# Patient Record
Sex: Female | Born: 1970 | Race: Black or African American | Hispanic: No | Smoking: Current every day smoker
Health system: Southern US, Community
[De-identification: ages and names within clinical notes are randomized; demographics above are authoritative.]

## PROBLEM LIST (undated history)

## (undated) DIAGNOSIS — F32A Depression, unspecified: Secondary | ICD-10-CM

## (undated) DIAGNOSIS — F419 Anxiety disorder, unspecified: Secondary | ICD-10-CM

## (undated) DIAGNOSIS — C55 Malignant neoplasm of uterus, part unspecified: Secondary | ICD-10-CM

## (undated) DIAGNOSIS — M797 Fibromyalgia: Secondary | ICD-10-CM

## (undated) DIAGNOSIS — M545 Low back pain, unspecified: Secondary | ICD-10-CM

## (undated) DIAGNOSIS — G4733 Obstructive sleep apnea (adult) (pediatric): Secondary | ICD-10-CM

## (undated) DIAGNOSIS — G43909 Migraine, unspecified, not intractable, without status migrainosus: Secondary | ICD-10-CM

## (undated) DIAGNOSIS — IMO0001 Reserved for inherently not codable concepts without codable children: Secondary | ICD-10-CM

## (undated) DIAGNOSIS — J449 Chronic obstructive pulmonary disease, unspecified: Secondary | ICD-10-CM

## (undated) DIAGNOSIS — D509 Iron deficiency anemia, unspecified: Secondary | ICD-10-CM

## (undated) DIAGNOSIS — K219 Gastro-esophageal reflux disease without esophagitis: Secondary | ICD-10-CM

## (undated) DIAGNOSIS — J45909 Unspecified asthma, uncomplicated: Secondary | ICD-10-CM

## (undated) DIAGNOSIS — I10 Essential (primary) hypertension: Secondary | ICD-10-CM

## (undated) DIAGNOSIS — G8929 Other chronic pain: Secondary | ICD-10-CM

## (undated) DIAGNOSIS — F329 Major depressive disorder, single episode, unspecified: Secondary | ICD-10-CM

## (undated) DIAGNOSIS — Z9289 Personal history of other medical treatment: Secondary | ICD-10-CM

---

## 2010-11-14 HISTORY — PX: TOTAL SHOULDER REPLACEMENT: SUR1217

## 2012-05-26 DIAGNOSIS — R110 Nausea: Secondary | ICD-10-CM | POA: Insufficient documentation

## 2012-05-26 DIAGNOSIS — N898 Other specified noninflammatory disorders of vagina: Secondary | ICD-10-CM | POA: Insufficient documentation

## 2012-05-26 DIAGNOSIS — R1031 Right lower quadrant pain: Secondary | ICD-10-CM | POA: Insufficient documentation

## 2012-05-26 DIAGNOSIS — R6883 Chills (without fever): Secondary | ICD-10-CM | POA: Insufficient documentation

## 2012-05-27 ENCOUNTER — Emergency Department (HOSPITAL_COMMUNITY)
Admission: EM | Admit: 2012-05-27 | Discharge: 2012-05-27 | Disposition: A | Discharge status: Home / Self Care | Payer: PRIVATE HEALTH INSURANCE | Attending: Emergency Medicine | Admitting: Emergency Medicine

## 2012-05-27 ENCOUNTER — Encounter (HOSPITAL_COMMUNITY): Payer: Self-pay | Admitting: *Deleted

## 2012-05-27 DIAGNOSIS — N939 Abnormal uterine and vaginal bleeding, unspecified: Secondary | ICD-10-CM

## 2012-05-27 DIAGNOSIS — R109 Unspecified abdominal pain: Secondary | ICD-10-CM

## 2012-05-27 LAB — CBC
MCH: 27.7 pg (ref 26.0–34.0)
MCHC: 34.2 g/dL (ref 30.0–36.0)
MCV: 81.1 fL (ref 78.0–100.0)
Platelets: 441 10*3/uL — ABNORMAL HIGH (ref 150–400)
RBC: 4.29 MIL/uL (ref 3.87–5.11)

## 2012-05-27 LAB — URINALYSIS, ROUTINE W REFLEX MICROSCOPIC
Bilirubin Urine: NEGATIVE
Ketones, ur: NEGATIVE mg/dL
Nitrite: NEGATIVE
Specific Gravity, Urine: 1.021 (ref 1.005–1.030)
Urobilinogen, UA: 0.2 mg/dL (ref 0.0–1.0)

## 2012-05-27 LAB — COMPREHENSIVE METABOLIC PANEL
AST: 20 U/L (ref 0–37)
CO2: 21 mEq/L (ref 19–32)
Calcium: 9.2 mg/dL (ref 8.4–10.5)
Creatinine, Ser: 0.58 mg/dL (ref 0.50–1.10)
GFR calc Af Amer: >90 mL/min (ref >90)
GFR calc non Af Amer: >90 mL/min (ref >90)
Glucose, Bld: 110 mg/dL — ABNORMAL HIGH (ref 70–99)
Total Protein: 7.5 g/dL (ref 6.0–8.3)

## 2012-05-27 LAB — URINE MICROSCOPIC-ADD ON

## 2012-05-27 MED ORDER — OXYCODONE-ACETAMINOPHEN 5-325 MG PO TABS
2.0000 | ORAL_TABLET | Freq: Once | ORAL | Status: AC
  Administered 2012-05-27: 2 via ORAL
  Filled 2012-05-27: qty 2

## 2012-05-27 MED ORDER — ONDANSETRON 8 MG PO TBDP: 8.0000 mg | ORAL_TABLET | Freq: Three times a day (TID) | ORAL | Status: AC | PRN

## 2012-05-27 MED ORDER — OXYCODONE-ACETAMINOPHEN 5-325 MG PO TABS: 1.0000 | ORAL_TABLET | ORAL | Status: DC | PRN

## 2012-05-27 NOTE — ED Notes (Signed)
Pt states that she was told in 12/12 that she had endometrial cancer cells in her pap smear. Then moved from [facility] to [facility] and has not seen a MD since. Pt states that she has excruciating cramps with each menstral period. Pt states that she took acetaminophen x 2.

## 2012-05-27 NOTE — ED Provider Notes (Signed)
History     CSN: [id number]  Arrival date & time 05/26/12  2354   First MD Initiated Contact with Patient 05/27/12 [phone]      Chief Complaint  Patient presents with  . Abdominal Pain  . Nausea     Patient is a 41 y.o. female presenting with abdominal pain. The history is provided by the patient.  Abdominal Pain The primary symptoms of the illness include abdominal pain and nausea. The primary symptoms of the illness do not include vomiting or diarrhea. Episode onset: earlier tonight. The onset of the illness was sudden. The problem has been gradually worsening.  Additional symptoms associated with the illness include chills. Symptoms associated with the illness do not include back pain.  Pt presents for sudden onset of RLQ pain with vaginal bleeding She reports that she has had this before, and will frequently accompany her menstrual cycle. She reports she was sitting in her car when this happened.  She was not exerting herself She denies dysuria.   She was otherwise well previously in the day PMH - fibromyalgia  Past Surgical History  Procedure Date  . Total shoulder replacement     No family history on file.  History  Substance Use Topics  . Smoking status: Not on file  . Smokeless tobacco: Not on file  . Alcohol Use: No    OB History    Grav Para Term Preterm Abortions TAB SAB Ect Mult Living                  Review of Systems  Constitutional: Positive for chills.  Gastrointestinal: Positive for nausea and abdominal pain. Negative for vomiting and diarrhea.  Musculoskeletal: Negative for back pain.  All other systems reviewed and are negative.    Allergies  Tramadol  Home Medications   Current Outpatient Rx  Name Route Sig Dispense Refill  . ACETAMINOPHEN 500 MG PO TABS Oral Take 1,000 mg by mouth every 6 (six) hours as needed. For pain      BP 133/86  Pulse 73  Temp 98.7 F (37.1 C) (Oral)  Resp 20  Wt 167 lb 15.9 oz (76.2 kg)  SpO2 100%  LMP  05/27/2012  Physical Exam CONSTITUTIONAL: Well developed/well nourished HEAD AND FACE: Normocephalic/atraumatic EYES: EOMI/PERRL ENMT: Mucous membranes moist NECK: supple no meningeal signs SPINE:entire spine nontender CV: S1/S2 noted, no murmurs/rubs/gallops noted LUNGS: Lungs are clear to auscultation bilaterally, no apparent distress ABDOMEN: soft, nontender, no rebound or guarding GU:no cva tenderness, vaginal bleeding noted but os is closed.  No adnexal tenderness is noted, no adnexal mass is noted.  No cmt.  No uterine enlargement is noted.  Chaperone present NEURO: Pt is awake/alert, moves all extremitiesx4 EXTREMITIES: pulses normal, full ROM SKIN: warm, color normal PSYCH: no abnormalities of mood noted  ED Course  Procedures   Labs Reviewed  URINALYSIS, ROUTINE W REFLEX MICROSCOPIC - Abnormal; Notable for the following:    Hgb urine dipstick MODERATE (*)     Leukocytes, UA TRACE (*)     All other components within normal limits  CBC - Abnormal; Notable for the following:    Hemoglobin 11.9 (*)     HCT 34.8 (*)     Platelets 441 (*)     All other components within normal limits  COMPREHENSIVE METABOLIC PANEL - Abnormal; Notable for the following:    Glucose, Bld 110 (*)     Total Bilirubin 0.1 (*)     All other components within normal  limits  URINE MICROSCOPIC-ADD ON - Abnormal; Notable for the following:    Squamous Epithelial / LPF FEW (*)     All other components within normal limits  POCT PREGNANCY, URINE   Pt reports that while she was in [place] was told she may have endometrial CA.  She has not followed up for this as of yet in [place].  She is well appearing, her abdomen and gyn exam were unremarkable except for vag bleeding.  She reports similar type pain with menstrual cycle in past.  I doubt acute abdominal/gyn process and I doubt TOA/torsion at this time.  Discussed strict return precautions.     MDM  Nursing notes including past medical history  and social history reviewed and considered in documentation All labs/vitals reviewed and considered         Joya Gaskins, MD 05/27/12 [phone]

## 2012-05-27 NOTE — Discharge Instructions (Signed)
Dysfunctional Uterine Bleeding Normally, menstrual periods begin between ages 11 to 17 in young women. A normal menstrual cycle/period may begin every 23 days up to 35 days and lasts from 1 to 7 days. Around 12 to 14 days before your menstrual period starts, ovulation (ovary produces an egg) occurs. When counting the time between menstrual periods, count from the first day of bleeding of the previous period to the first day of bleeding of the next period. Dysfunctional (abnormal) uterine bleeding is bleeding that is different from a normal menstrual period. Your periods may come earlier or later than usual. They may be lighter, have blood clots or be heavier. You may have bleeding between periods, or you may skip one period or more. You may have bleeding after sexual intercourse, bleeding after menopause, or no menstrual period. CAUSES   Pregnancy (normal, miscarriage, tubal).   IUDs (intrauterine device, birth control).   Birth control pills.   Hormone treatment.   Menopause.   Infection of the cervix.   Blood clotting problems.   Infection of the inside lining of the uterus.   Endometriosis, inside lining of the uterus growing in the pelvis and other female organs.   Adhesions (scar tissue) inside the uterus.   Obesity or severe weight loss.   Uterine polyps inside the uterus.   Cancer of the vagina, cervix, or uterus.   Ovarian cysts or polycystic ovary syndrome.   Medical problems (diabetes, thyroid disease).   Uterine fibroids (noncancerous tumor).   Problems with your female hormones.   Endometrial hyperplasia, very thick lining and enlarged cells inside of the uterus.   Medicines that interfere with ovulation.   Radiation to the pelvis or abdomen.   Chemotherapy.  DIAGNOSIS   Your doctor will discuss the history of your menstrual periods, medicines you are taking, changes in your weight, stress in your life, and any medical problems you may have.   Your doctor  will do a physical and pelvic examination.   Your doctor may want to perform certain tests to make a diagnosis, such as:   Pap test.   Blood tests.   Cultures for infection.   CT scan.   Ultrasound.   Hysteroscopy.   Laparoscopy.   MRI.   Hysterosalpingography.   D and C.   Endometrial biopsy.  TREATMENT  Treatment will depend on the cause of the dysfunctional uterine bleeding (DUB). Treatment may include:  Observing your menstrual periods for a couple of months.   Prescribing medicines for medical problems, including:   Antibiotics.   Hormones.   Birth control pills.   Removing an IUD (intrauterine device, birth control).   Surgery:   D and C (scrape and remove tissue from inside the uterus).   Laparoscopy (examine inside the abdomen with a lighted tube).   Uterine ablation (destroy lining of the uterus with electrical current, laser, heat, or freezing).   Hysteroscopy (examine cervix and uterus with a lighted tube).   Hysterectomy (remove the uterus).  HOME CARE INSTRUCTIONS   If medicines were prescribed, take exactly as directed. Do not change or switch medicines without consulting your caregiver.   Long term heavy bleeding may result in iron deficiency. Your caregiver may have prescribed iron pills. They help replace the iron that your body lost from heavy bleeding. Take exactly as directed.   Do not take aspirin or medicines that contain aspirin one week before or during your menstrual period. Aspirin may make the bleeding worse.   If you need   to change your sanitary pad or tampon more than once every 2 hours, stay in bed with your feet elevated and a cold pack on your lower abdomen. Rest as much as possible, until the bleeding stops or slows down.   Eat well-balanced meals. Eat foods high in iron. Examples are:   Leafy green vegetables.   Whole-grain breads and cereals.   Eggs.   Meat.   Liver.   Do not try to lose weight until the  abnormal bleeding has stopped and your blood iron level is back to normal. Do not lift more than ten pounds or do strenuous activities when you are bleeding.   For a couple of months, make note on your calendar, marking the start and ending of your period, and the type of bleeding (light, medium, heavy, spotting, clots or missed periods). This is for your caregiver to better evaluate your problem.  SEEK MEDICAL CARE IF:   You develop nausea (feeling sick to your stomach) and vomiting, dizziness, or diarrhea while you are taking your medicine.   You are getting lightheaded or weak.   You have any problems that may be related to the medicine you are taking.   You develop pain with your DUB.   You want to remove your IUD.   You want to stop or change your birth control pills or hormones.   You have any type of abnormal bleeding mentioned above.   You are over 25 years old and have not had a menstrual period yet.   You are 41 years old and you are still having menstrual periods.   You have any of the symptoms mentioned above.   You develop a rash.  SEEK IMMEDIATE MEDICAL CARE IF:   An oral temperature above 102 F (38.9 C) develops.   You develop chills.   You are changing your sanitary pad or tampon more than once an hour.   You develop abdominal pain.   You pass out or faint.  Document Released: 10/28/2000 Document Revised: 10/20/2011 Document Reviewed: 09/29/2009 [facility] Patient Information 2012 [place], [place].  RESOURCE GUIDE  Chronic Pain Problems: Contact [facility]  [phone] Patients need to be referred by their primary care doctor.  Insufficient Money for Medicine: Contact United Way:  call "211" or [facility] [phone] [phone].  No Primary Care Doctor: - Call Health Connect  [phone] - can help you locate a primary care doctor that  accepts your insurance, provides certain services, etc. - Physician Referral [phone]  Agencies that provide inexpensive medical care: - [facility]  [phone] - [facility]  [phone] - [facility]  [phone] [facility]  [phone] - Planned Parenthood  [phone] [facility]  [phone]  Medicaid-accepting [facility] Providers: - [facility]- [address]  [phone], Mon-Fri 9am-7pm, Sat 9am-1pm - [facility]- [address] [place], [address] [place]  [phone] - [facility]- [address], [address] [place]  [phone] [facility]- [address]  [phone] - Renaye Rakers- [address] [place], [address], [phone]  Only accepts [place] patients after they have their name  applied to their card  Self Pay (no insurance) in [place]: - Sickle Cell Patients: Dr Willey Blade, [facility]  [address] [place], [phone] - [facility]- [address] [place]  [phone]       -  [facility]- [address]       -     [facility]- see information above (Speak to [employer] if you do not have insurance)       -  [facility]- [address] [place], [phone]       -  [facility]- [address],  [phone]       -  [facility]- [address] [phone]       -  Dr Julio Sicks-  [address], [address], [place], [phone]       -  [facility]- [address] [phone]       -  [facility]- [address] [phone], also [address] [phone]       -    [facility]- [address] [place], [phone], 1st & 3rd Saturday   every month, 10am-1pm  1) Find a Doctor and Pay Out of Pocket Although you won't have to find out who is covered by your insurance plan, it is a good idea to ask around and get recommendations. You will then need to call the office and  see if the doctor you have chosen will accept you as a new patient and what types of options they offer for patients who are self-pay. Some doctors offer discounts or will set up payment plans for their patients who do not have insurance, but you will need to ask so you aren't surprised when you get to your appointment.  2) Contact Your Local Health Department Not all health departments have doctors that can see patients for sick visits, but many do, so it is worth a call to see if yours does. If you don't know where your local health department is, you can check in your phone book. The CDC also has a tool to help you locate your state's health department, and many state websites also have listings of all of their local health departments.  3) Find a Walk-in Clinic If your illness is not likely to be very severe or complicated, you may want to try a walk in clinic. These are popping up all over the country in pharmacies, drugstores, and shopping centers. They're usually staffed by nurse practitioners or physician assistants that have been trained to treat common illnesses and complaints. They're usually fairly quick and inexpensive. However, if you have serious medical issues or chronic medical problems, these are probably not your best option  STD Testing - [facility] Department of [facility], STD Clinic, [address] [place], phone [phone] or [phone].  Monday - Friday, call for an appointment. [facility] Department of [employer], STD Clinic, [address], [place], phone [phone] or [phone].  Monday - Friday, call for an appointment.  Abuse/Neglect: [facility] Child Abuse Hotline 308-831-5396 [facility] Child Abuse Hotline (419)645-8652 (After Hours)  Emergency Shelter:  [facility] 302-243-0420  Maternity Homes: - [facility] 925-795-2721 - [facility] (684)411-3148  MRSA Hotline #:   (704) 018-6306  [facility] Resources  [facility] [place] [facility]  United Way [facility] [address]                 [address]  [address]  [place]                                               Cristobal Goldmann Phone:  [phone]                                  Phone:  [phone]                   Phone:  [phone]  [facility], [phone] - [phone]       -     [facility] in [place], [address],                                  [phone], [facility] Child Abuse Hotline 541-687-1848 or (458) 851-2633 (After Hours)   Behavioral Health Services  Substance Abuse Resources: - [facility] Drug [facility]  (317) 276-5663 - [facility] (270)301-9824 - [facility] [phone] Floydene Flock [phone] - Residential & Outpatient Substance Abuse Program  [phone]  Psychological Services: [facility]  [phone] [facility] Services  [phone] - [facility], [phone] [place]. [address] [place], ACCESS LINE: (920)136-3184 or [phone], [employer].za  Dental Assistance  If unable to pay or uninsured, contact:  [facility] or [facility]. to become qualified for the adult dental clinic.  Patients with Medicaid: [facility] [phone], [phone] [address], [phone]  If unable to pay, or uninsured, contact [facility] [phone]) or [facility] Department [phone] in [place], [phone] in [facility]) to become qualified for the adult dental clinic  Other Low-Cost Community Dental Services: - [facility]- [address] [place], [place], [place], [address], [phone], [address], 2nd and 4th  Thursday of the month at 6:30am.  10 clients each day by appointment, can sometimes see walk-in patients if someone does not show for an appointment. [facility]- [address] [place] [place], [place], [address], [phone] - [facility]- [address] [place], [place], [address], [phone] - [facility]- [phone] [facility] Department- [phone] [facility] Department- [phone]

## 2012-05-27 NOTE — ED Notes (Signed)
Sudden onset lower right quadrant pain

## 2012-05-28 ENCOUNTER — Emergency Department (HOSPITAL_COMMUNITY): Payer: PRIVATE HEALTH INSURANCE

## 2012-05-28 ENCOUNTER — Encounter (HOSPITAL_COMMUNITY): Payer: Self-pay | Admitting: Emergency Medicine

## 2012-05-28 ENCOUNTER — Emergency Department (HOSPITAL_COMMUNITY)
Admission: EM | Admit: 2012-05-28 | Discharge: 2012-05-29 | Disposition: A | Discharge status: Home / Self Care | Payer: PRIVATE HEALTH INSURANCE | Attending: Emergency Medicine | Admitting: Emergency Medicine

## 2012-05-28 DIAGNOSIS — R10813 Right lower quadrant abdominal tenderness: Secondary | ICD-10-CM | POA: Insufficient documentation

## 2012-05-28 DIAGNOSIS — R109 Unspecified abdominal pain: Secondary | ICD-10-CM | POA: Insufficient documentation

## 2012-05-28 DIAGNOSIS — R112 Nausea with vomiting, unspecified: Secondary | ICD-10-CM | POA: Insufficient documentation

## 2012-05-28 HISTORY — DX: Fibromyalgia: M79.7

## 2012-05-28 LAB — URINALYSIS, ROUTINE W REFLEX MICROSCOPIC
Protein, ur: 30 mg/dL — AB
Urobilinogen, UA: 0.2 mg/dL (ref 0.0–1.0)

## 2012-05-28 LAB — BASIC METABOLIC PANEL
Calcium: 9.7 mg/dL (ref 8.4–10.5)
GFR calc Af Amer: >90 mL/min (ref >90)
GFR calc non Af Amer: >90 mL/min (ref >90)
Sodium: 136 mEq/L (ref 135–145)

## 2012-05-28 LAB — CBC WITH DIFFERENTIAL/PLATELET
Basophils Absolute: 0 10*3/uL (ref 0.0–0.1)
Basophils Relative: 0 % (ref 0–1)
Eosinophils Absolute: 0.1 10*3/uL (ref 0.0–0.7)
Eosinophils Relative: 2 % (ref 0–5)
MCH: 27.7 pg (ref 26.0–34.0)
MCV: 82.3 fL (ref 78.0–100.0)
Platelets: 440 10*3/uL — ABNORMAL HIGH (ref 150–400)
RDW: 13.5 % (ref 11.5–15.5)

## 2012-05-28 LAB — URINE MICROSCOPIC-ADD ON

## 2012-05-28 MED ORDER — SODIUM CHLORIDE 0.9 % IV BOLUS (SEPSIS)
1000.0000 mL | Freq: Once | INTRAVENOUS | Status: AC
  Administered 2012-05-28: 1000 mL via INTRAVENOUS

## 2012-05-28 MED ORDER — SODIUM CHLORIDE 0.9 % IV SOLN
Freq: Once | INTRAVENOUS | Status: AC
  Administered 2012-05-28: 21:00:00 via INTRAVENOUS

## 2012-05-28 MED ORDER — IOHEXOL 300 MG/ML  SOLN
100.0000 mL | Freq: Once | INTRAMUSCULAR | Status: AC | PRN
  Administered 2012-05-28: 100 mL via INTRAVENOUS

## 2012-05-28 MED ORDER — MORPHINE SULFATE 4 MG/ML IJ SOLN
4.0000 mg | Freq: Once | INTRAMUSCULAR | Status: AC
  Administered 2012-05-28: 4 mg via INTRAVENOUS
  Filled 2012-05-28: qty 1

## 2012-05-28 MED ORDER — ONDANSETRON HCL 4 MG/2ML IJ SOLN
4.0000 mg | Freq: Once | INTRAMUSCULAR | Status: AC
  Administered 2012-05-28: 4 mg via INTRAVENOUS
  Filled 2012-05-28: qty 2

## 2012-05-28 MED ORDER — PROMETHAZINE HCL 25 MG PO TABS: 25.0000 mg | ORAL_TABLET | Freq: Four times a day (QID) | ORAL | Status: DC | PRN

## 2012-05-28 MED ORDER — PROMETHAZINE HCL 25 MG RE SUPP: 25.0000 mg | Freq: Four times a day (QID) | RECTAL | Status: DC | PRN

## 2012-05-28 NOTE — ED Provider Notes (Signed)
History     CSN: [id number]  Arrival date & time 05/28/12  1936   First MD Initiated Contact with Patient 05/28/12 2033      Chief Complaint  Patient presents with  . Emesis    (Consider location/radiation/quality/duration/timing/severity/associated sxs/prior treatment) HPI Comments: Pt with nausea and vomiting for past 3 days. Was seen in ED for this on Sat evening, had negative w/u, and was discharged home with percocet and zofran. Pt states she's had continued sx and has been unable to hold down her medications due to this. No diarrhea, fever, urinary sx. No sig change in sx since Sat.  Pt reports she began her menstrual period on Sat and has had severe cramping located to the suprapubic area and RLQ with this. This is fairly typical of her periods, but she hasn't had n/v in the past. Reports she was diagnosed in 12/12 with endometrial cancer - reports she was hospitalized and recalls having US of the pelvis, no biopsies taken. This was when she was living in [place]; she hasn't followed up on this since moving to [place].  Patient is a 41 y.o. female presenting with vomiting. The history is provided by the patient.  Emesis  This is a new problem. Episode onset: 3 days ago. The problem occurs 5 to 10 times per day. The problem has not changed since onset.The emesis has an appearance of stomach contents. There has been no fever. Associated symptoms include abdominal pain. Pertinent negatives include no chills and no cough.    Past Medical History  Diagnosis Date  . Endometrial cancer   . Fibromyalgia     Past Surgical History  Procedure Date  . Total shoulder replacement     Family History  Problem Relation Age of Onset  . Coronary artery disease Father   . Heart attack Father   . Hypertension Other   . Diabetes Other   . Cancer Other     History  Substance Use Topics  . Smoking status: Current Everyday Smoker -- 0.5 packs/day    Types: Cigarettes  . Smokeless tobacco:  Not on file  . Alcohol Use: No    OB History    Grav Para Term Preterm Abortions TAB SAB Ect Mult Living                  Review of Systems  Constitutional: Negative for chills.  Respiratory: Negative for cough.   Gastrointestinal: Positive for vomiting and abdominal pain.   10 pt ROS otherwise negative except as HPI  Allergies  Tramadol  Home Medications   Current Outpatient Rx  Name Route Sig Dispense Refill  . ACETAMINOPHEN 500 MG PO TABS Oral Take 1,000 mg by mouth every 6 (six) hours as needed. For pain    . ONDANSETRON 8 MG PO TBDP Oral Take 1 tablet (8 mg total) by mouth every 8 (eight) hours as needed for nausea. 20 tablet 0  . OXYCODONE-ACETAMINOPHEN 5-325 MG PO TABS Oral Take 1 tablet by mouth every 4 (four) hours as needed for pain. 15 tablet 0    BP 120/79  Pulse 81  Temp 98.4 F (36.9 C) (Oral)  Resp 18  SpO2 100%  LMP 05/26/2012  Physical Exam  Nursing note and vitals reviewed. Constitutional: She appears well-developed and well-nourished. No distress.  HENT:  Head: Normocephalic and atraumatic.  Mouth/Throat: Oropharynx is clear and moist. No oropharyngeal exudate.  Eyes:       Normal appearance  Neck: Normal range of motion.  Neck supple.  Cardiovascular: Normal rate, regular rhythm and normal heart sounds.   Pulmonary/Chest: Effort normal and breath sounds normal.  Abdominal: Soft. Bowel sounds are normal. There is tenderness in the right lower quadrant. There is no rebound, no guarding and no tenderness at McBurney's point.  Musculoskeletal: Normal range of motion.  Lymphadenopathy:    She has no cervical adenopathy.  Neurological: She is alert.  Skin: Skin is warm and dry. She is not diaphoretic.  Psychiatric: She has a normal mood and affect.    ED Course  Procedures (including critical care time)  Labs Reviewed  CBC WITH DIFFERENTIAL - Abnormal; Notable for the following:    Platelets 440 (*)     All other components within normal  limits  URINALYSIS, ROUTINE W REFLEX MICROSCOPIC - Abnormal; Notable for the following:    APPearance CLOUDY (*)     Hgb urine dipstick LARGE (*)     Ketones, ur TRACE (*)     Protein, ur 30 (*)     Leukocytes, UA SMALL (*)     All other components within normal limits  URINE MICROSCOPIC-ADD ON - Abnormal; Notable for the following:    Squamous Epithelial / LPF FEW (*)     Bacteria, UA MANY (*)     All other components within normal limits  BASIC METABOLIC PANEL   Ct Abdomen Pelvis W Contrast  05/28/2012  *RADIOLOGY REPORT*  Clinical Data: Right lower quadrant and suprapubic pain for 3 days. Nausea and vomiting.  CT ABDOMEN AND PELVIS WITH CONTRAST  Technique:  Multidetector CT imaging of the abdomen and pelvis was performed following the standard protocol during bolus administration of intravenous contrast.  Contrast: OMNIPAQUE IOHEXOL 300 MG/ML  SOLN  Comparison: None.  Findings: Visualized lung bases are clear.  The liver, spleen, gallbladder, pancreas, adrenal glands, abdominal aorta, and retroperitoneal lymph nodes are unremarkable.  15 mm cyst in the lower pole of the right kidney.  Additional 5 mm cyst in the lower pole of the right kidney.  No solid mass or hydronephrosis in either kidney.  The stomach and small bowel are not distended.  Stool filled colon without distension.  No free air or free fluid in the abdomen.  Pelvis:  The uterus and adnexal structures are not enlarged.  The bladder is decompressed without apparent wall thickening.  Small amount of free fluid in the pelvis which may be physiologic.  The appendix is normal.  No evidence of diverticulitis.  Mild degenerative changes in the lumbar spine.  IMPRESSION: No acute process demonstrated in the abdomen or pelvis.  Original Report Authenticated By: Marlon Pel, M.D.     1. Nausea and vomiting       MDM  Pt presents with n/v and abd pain since Sat. Previous note and labs reviewed, labs were reassuring  appearing at that visit. No sig new findings on labwork today, no evidence of dehydration on exam or via labs. Based on length of pt's sx, imaging was obtained which is negative for acute abd or gross evidence of oncologic process. Pt felt significantly better with fluids and pain meds and had no emesis while in dept. New rxes given for Phenergan. Instructed her on importance of f/u with GYN for further eval. Pt verbalized understanding, agreed to plan.        Grant Fontana, PA-C 05/29/12 1555

## 2012-05-28 NOTE — ED Notes (Signed)
Pt states she was seen here on Saturday and was given oxycodone APAP and zofran   Pt states she is unable to hold the pain medication down it has been making her nauseated and vomiting even with the zofran  Pt states she has also been having cramping with the medication  Pt states she came here for the cramping and states she has endometrial cancer

## 2012-05-30 NOTE — ED Provider Notes (Signed)
Medical screening examination/treatment/procedure(s) were performed by non-physician practitioner and as supervising physician I was immediately available for consultation/collaboration.   Loren Racer, MD 05/30/12 [phone]

## 2012-05-31 ENCOUNTER — Observation Stay (HOSPITAL_COMMUNITY): Payer: PRIVATE HEALTH INSURANCE

## 2012-05-31 ENCOUNTER — Inpatient Hospital Stay (HOSPITAL_COMMUNITY)
Admission: EM | Admit: 2012-05-31 | Discharge: 2012-06-03 | DRG: 603 | Disposition: A | Discharge status: Home / Self Care | Payer: PRIVATE HEALTH INSURANCE | Attending: Internal Medicine | Admitting: Internal Medicine

## 2012-05-31 ENCOUNTER — Encounter (HOSPITAL_COMMUNITY): Payer: Self-pay | Admitting: Emergency Medicine

## 2012-05-31 DIAGNOSIS — D649 Anemia, unspecified: Secondary | ICD-10-CM | POA: Diagnosis present

## 2012-05-31 DIAGNOSIS — D72829 Elevated white blood cell count, unspecified: Secondary | ICD-10-CM

## 2012-05-31 DIAGNOSIS — E876 Hypokalemia: Secondary | ICD-10-CM

## 2012-05-31 DIAGNOSIS — D509 Iron deficiency anemia, unspecified: Secondary | ICD-10-CM

## 2012-05-31 DIAGNOSIS — T380X5A Adverse effect of glucocorticoids and synthetic analogues, initial encounter: Secondary | ICD-10-CM | POA: Diagnosis not present

## 2012-05-31 DIAGNOSIS — R221 Localized swelling, mass and lump, neck: Secondary | ICD-10-CM | POA: Diagnosis present

## 2012-05-31 DIAGNOSIS — F17200 Nicotine dependence, unspecified, uncomplicated: Secondary | ICD-10-CM | POA: Diagnosis present

## 2012-05-31 DIAGNOSIS — L03221 Cellulitis of neck: Principal | ICD-10-CM

## 2012-05-31 DIAGNOSIS — R220 Localized swelling, mass and lump, head: Secondary | ICD-10-CM

## 2012-05-31 DIAGNOSIS — T7840XA Allergy, unspecified, initial encounter: Secondary | ICD-10-CM

## 2012-05-31 DIAGNOSIS — L0211 Cutaneous abscess of neck: Principal | ICD-10-CM | POA: Diagnosis present

## 2012-05-31 DIAGNOSIS — IMO0001 Reserved for inherently not codable concepts without codable children: Secondary | ICD-10-CM | POA: Diagnosis present

## 2012-05-31 DIAGNOSIS — I10 Essential (primary) hypertension: Secondary | ICD-10-CM | POA: Diagnosis present

## 2012-05-31 DIAGNOSIS — Z79899 Other long term (current) drug therapy: Secondary | ICD-10-CM

## 2012-05-31 DIAGNOSIS — Z8542 Personal history of malignant neoplasm of other parts of uterus: Secondary | ICD-10-CM

## 2012-05-31 HISTORY — DX: Iron deficiency anemia, unspecified: D50.9

## 2012-05-31 LAB — CBC WITH DIFFERENTIAL/PLATELET
Basophils Absolute: 0 10*3/uL (ref 0.0–0.1)
Eosinophils Relative: 3 % (ref 0–5)
HCT: 34.6 % — ABNORMAL LOW (ref 36.0–46.0)
Lymphocytes Relative: 34 % (ref 12–46)
Lymphs Abs: 2.5 10*3/uL (ref 0.7–4.0)
MCV: 82.6 fL (ref 78.0–100.0)
Monocytes Absolute: 0.5 10*3/uL (ref 0.1–1.0)
RDW: 13.5 % (ref 11.5–15.5)
WBC: 7.2 10*3/uL (ref 4.0–10.5)

## 2012-05-31 LAB — BASIC METABOLIC PANEL
BUN: 9 mg/dL (ref 6–23)
CO2: 25 mEq/L (ref 19–32)
Calcium: 9.3 mg/dL (ref 8.4–10.5)
Creatinine, Ser: 0.63 mg/dL (ref 0.50–1.10)
Glucose, Bld: 83 mg/dL (ref 70–99)

## 2012-05-31 LAB — FOLATE: Folate: 17.7 ng/mL

## 2012-05-31 LAB — FERRITIN: Ferritin: 19 ng/mL (ref 10–291)

## 2012-05-31 LAB — IRON AND TIBC
Iron: 14 ug/dL — ABNORMAL LOW (ref 42–135)
Saturation Ratios: 4 % — ABNORMAL LOW (ref 20–55)
TIBC: 395 ug/dL (ref 250–470)
UIBC: 381 ug/dL (ref 125–400)

## 2012-05-31 MED ORDER — EPINEPHRINE 0.3 MG/0.3ML IJ DEVI
0.3000 mg | Freq: Once | INTRAMUSCULAR | Status: AC
  Administered 2012-05-31: 0.3 mg via INTRAMUSCULAR
  Filled 2012-05-31: qty 0.3

## 2012-05-31 MED ORDER — METHYLPREDNISOLONE SODIUM SUCC 125 MG IJ SOLR
125.0000 mg | Freq: Once | INTRAMUSCULAR | Status: AC
  Administered 2012-05-31: 125 mg via INTRAVENOUS
  Filled 2012-05-31: qty 2

## 2012-05-31 MED ORDER — POLYETHYLENE GLYCOL 3350 17 G PO PACK
17.0000 g | PACK | Freq: Every day | ORAL | Status: DC | PRN
  Filled 2012-05-31: qty 1

## 2012-05-31 MED ORDER — SODIUM CHLORIDE 0.9 % IV SOLN
3.0000 g | Freq: Once | INTRAVENOUS | Status: AC
  Administered 2012-05-31: 3 g via INTRAVENOUS
  Filled 2012-05-31: qty 3

## 2012-05-31 MED ORDER — SODIUM CHLORIDE 0.9 % IV SOLN
INTRAVENOUS | Status: AC
  Administered 2012-05-31 – 2012-06-01 (×3): via INTRAVENOUS

## 2012-05-31 MED ORDER — ONDANSETRON 8 MG/NS 50 ML IVPB
8.0000 mg | Freq: Four times a day (QID) | INTRAVENOUS | Status: DC | PRN
  Administered 2012-05-31 – 2012-06-01 (×2): 8 mg via INTRAVENOUS
  Filled 2012-05-31 (×2): qty 8

## 2012-05-31 MED ORDER — SODIUM CHLORIDE 0.9 % IV SOLN
3.0000 g | Freq: Four times a day (QID) | INTRAVENOUS | Status: DC
  Administered 2012-05-31 – 2012-06-03 (×12): 3 g via INTRAVENOUS
  Filled 2012-05-31 (×15): qty 3

## 2012-05-31 MED ORDER — POTASSIUM CHLORIDE CRYS ER 20 MEQ PO TBCR
40.0000 meq | EXTENDED_RELEASE_TABLET | Freq: Once | ORAL | Status: AC
  Administered 2012-05-31: 40 meq via ORAL
  Filled 2012-05-31: qty 2

## 2012-05-31 MED ORDER — SODIUM CHLORIDE 0.9 % IJ SOLN
3.0000 mL | Freq: Two times a day (BID) | INTRAMUSCULAR | Status: DC
  Administered 2012-05-31 – 2012-06-03 (×4): 3 mL via INTRAVENOUS

## 2012-05-31 MED ORDER — OXYCODONE HCL 5 MG PO TABS
5.0000 mg | ORAL_TABLET | ORAL | Status: DC | PRN
  Administered 2012-05-31 – 2012-06-03 (×13): 5 mg via ORAL
  Filled 2012-05-31 (×13): qty 1

## 2012-05-31 MED ORDER — IOHEXOL 300 MG/ML  SOLN
100.0000 mL | Freq: Once | INTRAMUSCULAR | Status: AC | PRN
  Administered 2012-05-31: 100 mL via INTRAVENOUS

## 2012-05-31 MED ORDER — ALUM & MAG HYDROXIDE-SIMETH 200-200-20 MG/5ML PO SUSP: 30.0000 mL | Freq: Four times a day (QID) | ORAL | Status: DC | PRN

## 2012-05-31 MED ORDER — DIPHENHYDRAMINE HCL 50 MG/ML IJ SOLN
50.0000 mg | Freq: Four times a day (QID) | INTRAMUSCULAR | Status: DC
  Administered 2012-05-31 – 2012-06-01 (×4): 50 mg via INTRAVENOUS
  Filled 2012-05-31 (×8): qty 1

## 2012-05-31 MED ORDER — FAMOTIDINE IN NACL 20-0.9 MG/50ML-% IV SOLN
20.0000 mg | Freq: Once | INTRAVENOUS | Status: AC
  Administered 2012-05-31: 20 mg via INTRAVENOUS
  Filled 2012-05-31: qty 50

## 2012-05-31 MED ORDER — DIPHENHYDRAMINE HCL 50 MG/ML IJ SOLN
25.0000 mg | Freq: Once | INTRAMUSCULAR | Status: AC
  Administered 2012-05-31: 25 mg via INTRAVENOUS
  Filled 2012-05-31: qty 1

## 2012-05-31 MED ORDER — MORPHINE SULFATE 2 MG/ML IJ SOLN
1.0000 mg | INTRAMUSCULAR | Status: DC | PRN
Start: 2012-05-31 — End: 2012-06-03
  Administered 2012-05-31 – 2012-06-03 (×14): 2 mg via INTRAVENOUS
  Filled 2012-05-31 (×14): qty 1

## 2012-05-31 MED ORDER — ONDANSETRON HCL 4 MG PO TABS
8.0000 mg | ORAL_TABLET | Freq: Four times a day (QID) | ORAL | Status: DC | PRN
  Administered 2012-05-31 – 2012-06-02 (×3): 8 mg via ORAL
  Filled 2012-05-31 (×3): qty 2

## 2012-05-31 MED ORDER — FAMOTIDINE IN NACL 20-0.9 MG/50ML-% IV SOLN
20.0000 mg | Freq: Two times a day (BID) | INTRAVENOUS | Status: DC
  Administered 2012-05-31 – 2012-06-03 (×7): 20 mg via INTRAVENOUS
  Filled 2012-05-31 (×8): qty 50

## 2012-05-31 MED ORDER — METHYLPREDNISOLONE SODIUM SUCC 125 MG IJ SOLR
80.0000 mg | Freq: Four times a day (QID) | INTRAMUSCULAR | Status: DC
  Administered 2012-05-31 – 2012-06-01 (×4): 80 mg via INTRAVENOUS
  Filled 2012-05-31 (×8): qty 1.28

## 2012-05-31 MED ORDER — ACETAMINOPHEN 650 MG RE SUPP: 650.0000 mg | Freq: Four times a day (QID) | RECTAL | Status: DC | PRN

## 2012-05-31 MED ORDER — ENOXAPARIN SODIUM 40 MG/0.4ML ~~LOC~~ SOLN
40.0000 mg | SUBCUTANEOUS | Status: DC
  Administered 2012-05-31 – 2012-06-02 (×3): 40 mg via SUBCUTANEOUS
  Filled 2012-05-31 (×3): qty 0.4

## 2012-05-31 MED ORDER — ACETAMINOPHEN 325 MG PO TABS: 650.0000 mg | ORAL_TABLET | Freq: Four times a day (QID) | ORAL | Status: DC | PRN

## 2012-05-31 MED ORDER — DOCUSATE SODIUM 100 MG PO CAPS
100.0000 mg | ORAL_CAPSULE | Freq: Two times a day (BID) | ORAL | Status: DC
  Administered 2012-05-31 – 2012-06-03 (×7): 100 mg via ORAL
  Filled 2012-05-31 (×8): qty 1

## 2012-05-31 NOTE — ED Notes (Signed)
ZOX:[id number]<AV> Expected date:<BR> Expected time:<BR> Means of arrival:<BR> Comments:<BR> CLOSED

## 2012-05-31 NOTE — H&P (Signed)
[facility] History and Physical  Ameli Sangiovanni GNF:[id number] DOB: [birth year] DOA: 05/31/2012  Referring physician: Dr Patria Mane PCP: No primary provider on file.   Chief Complaint: Neck swelling/SOB  HPI:  Autumn Gordon is a 41 year old African American female with a history of endometrial cancer per patient, fibromyalgia, anemia who presents to the ED with a several hour history of neck swelling difficulty swallowing neck pain which awoke the patient at 4 AM on the day of admission. Patient states that the night prior to admission had some fried oysters/seafood at that at approximately 10:30 PM denies any prior history of allergy to seafood. Patient states awoke at 4 AM on the day of admission with tightness in the throat feeling like she couldn't swallow and some shortness of breath. Patient denies any fever, no chills, no cough, no nausea, no vomiting, no chest pain, no diarrhea, no constipation, no dysuria, no abdominal pain, no generalized weakness. Patient denies any other associated symptoms. Patient was seen in the ED and treated with IV Benadryl Pepcid and Solu-Medrol and allergic reaction. CT of the neck was obtained which did show diffuse inflammation of the submandibular glands worrisome for cellulitis versus allergic reaction as a secondary etiology. Patient was given a dose of IV Unasyn. Will call to admit the patient for further evaluation and management. Patient is speaking in full sentences. Patient just ate a cracker without any difficulties. Patient states that she doesn't feel significantly better however shortness of breath has improved.  Review of Systems:  All systems were reviewed with the patient and per history of present illness otherwise negative.  Past Medical History  Diagnosis Date  . Endometrial cancer   . Fibromyalgia   . Anemia 05/31/2012   Past Surgical History  Procedure Date  . Total shoulder replacement    Social History:  reports  that she has been smoking Cigarettes.  She has a 5 pack-year smoking history. She has never used smokeless tobacco. She reports that she does not drink alcohol or use illicit drugs.  Allergies  Allergen Reactions  . Shellfish Allergy Anaphylaxis  . Tramadol Hives    Family History  Problem Relation Age of Onset  . Coronary artery disease Father   . Heart attack Father   . Hypertension Other   . Diabetes Other   . Cancer Other      Prior to Admission medications   Medication Sig Start Date End Date Taking? Authorizing Provider  acetaminophen (TYLENOL) 500 MG tablet Take 1,000 mg by mouth every 6 (six) hours as needed. For pain   Yes Historical Provider, MD  ondansetron (ZOFRAN ODT) 8 MG disintegrating tablet Take 1 tablet (8 mg total) by mouth every 8 (eight) hours as needed for nausea. 05/27/12 06/03/12 Yes Joya Gaskins, MD  oxyCODONE-acetaminophen (PERCOCET) 5-325 MG per tablet Take 1 tablet by mouth every 4 (four) hours as needed for pain. 05/27/12 06/06/12 Yes Joya Gaskins, MD  promethazine (PHENERGAN) 25 MG suppository Place 1 suppository (25 mg total) rectally every 6 (six) hours as needed for nausea. 05/28/12 06/04/12 Yes Grant Fontana, PA-C   Physical Exam: Filed Vitals:   05/31/12 0439 05/31/12 0545 05/31/12 0700 05/31/12 0729  BP: 125/81   123/64  Pulse: 96 93 87   Temp: 98 F (36.7 C)   98.7 F (37.1 C)  TempSrc: Oral   Oral  Resp: 20 18 15 17   Height: 5\' 4"  (1.626 m)     Weight: 74.844 kg (165 lb)  SpO2: 100% 100% 99% 100%     General:  Well-developed well-nourished speaking in full sentences in no acute cardiopulmonary distress.  Eyes: Pupils equal round and reactive to light and accommodation. Extraocular movements intact.  ENT: Oropharynx is clear with no lesions no exudates. Poor dentition  Neck: Significant submandibular swelling with tenderness to palpation. Lymphadenopathy. No JVD. No tracheal deviation. Neck is supple.  Cardiovascular:  Regular rate rhythm without murmurs rubs or gallops  Respiratory: Clear to auscultation bilaterally no wheezes no crackles no rhonchi  Abdomen: Soft nontender nondistended positive bowel sounds  Skin: No rashes noted  Musculoskeletal: 5 out of 5 bilateral upper extremity strength. Out of 5 bilateral lower extremity strength.  Psychiatric: Normal affect. Normal mood. Good judgment. Good insight.  Neurologic: Alert and oriented x3. Cranial nerves II through XII are grossly intact. No focal deficits.  Labs on Admission:  Basic Metabolic Panel:  Lab 05/31/12 6962 05/28/12 2035 05/27/12 0046  NA 138 136 138  K 3.4* 3.8 3.6  CL 102 100 104  CO2 25 25 21   GLUCOSE 83 85 110*  BUN 9 7 10   CREATININE 0.63 0.61 0.58  CALCIUM 9.3 9.7 9.2  MG -- -- --  PHOS -- -- --   Liver Function Tests:  Lab 05/27/12 0046  AST 20  ALT 9  ALKPHOS 53  BILITOT 0.1*  PROT 7.5  ALBUMIN 3.9   No results found for this basename: LIPASE:5,AMYLASE:5 in the last 168 hours No results found for this basename: AMMONIA:5 in the last 168 hours CBC:  Lab 05/31/12 0500 05/28/12 2035 05/27/12 0046  WBC 7.2 5.8 6.9  NEUTROABS 4.0 3.0 --  HGB 11.7* 12.4 11.9*  HCT 34.6* 36.8 34.8*  MCV 82.6 82.3 81.1  PLT 405* 440* 441*   Cardiac Enzymes: No results found for this basename: CKTOTAL:5,CKMB:5,CKMBINDEX:5,TROPONINI:5 in the last 168 hours  BNP (last 3 results) No results found for this basename: PROBNP:3 in the last 8760 hours CBG: No results found for this basename: GLUCAP:5 in the last 168 hours  Radiological Exams on Admission: Ct Soft Tissue Neck W Contrast  05/31/2012  *RADIOLOGY REPORT*  Clinical Data: Acute submandibular swelling.  Question allergic reaction versus Ludwigs  angina.  CT NECK WITH CONTRAST  Technique:  Multidetector CT imaging of the neck was performed with intravenous contrast.  Contrast: OMNIPAQUE IOHEXOL 300 MG/ML  SOLN  Comparison: None.  Findings:  Diffuse inflammation of  the submandibular glands. Prominent surrounding inflammation/fluid extends throughout the adjacent soft tissues including fluid extending along the parapharyngeal space, superficial to the submandibular gland, surrounding the strap muscles reaching level of the thyroid gland and subcutaneous region. Asymmetric fluid within the floor of mouth slightly greater on the left.  Poor dentition.  Findings suggest diffuse cellulitis with allergic reaction a secondary less likely consideration.  Scattered increased number of predominately normal sized lymph nodes throughout the neck.  Slight asymmetry of the palatine tonsils with slight fullness on the left.  Mucosa abnormality not excluded.  Several small thyroid lesions largest measuring up to the 9 mm. These can be followed by the thyroid ultrasound on an elective basis.  Biapical lung parenchymal changes suggestive of scarring without associated bony destruction.  No evidence of septic thrombophlebitis of the internal jugular vein.  Visualized orbital and intracranial structures  No retropharyngeal abscess or epidural abscess noted.  IMPRESSION: Diffuse inflammation of the submandibular glands.  Prominent surrounding inflammation/fluid extends throughout the adjacent soft tissues including fluid extending along the parapharyngeal space,  superficial to the submandibular gland, surrounding the strap muscles reaching level of the thyroid gland and subcutaneous region. Asymmetric fluid within the floor of mouth slightly greater on the left.  Poor dentition.  Findings suggest diffuse cellulitis with allergic reaction a secondary less likely consideration.  Scattered increased number of predominately normal sized lymph nodes throughout the neck.  Slight asymmetry of the palatine tonsils with slight fullness on the left.  Mucosa abnormality not excluded.  Several small thyroid lesions largest measuring up to the 9 mm. These can be followed by the thyroid ultrasound on an  elective basis.  Critical Value/emergent results were called by telephone at the time of interpretation on 05/31/2012 at 8:45 a.m. to Dr. Patria Mane, who verbally acknowledged these results.  Original Report Authenticated By: Fuller Canada, M.D.    EKG: None.  Assessment/Plan Principal Problem:  *Swelling of submandibular region Active Problems:  Anemia  Hypokalemia  #1 Swelling of the submandibular region Likely secondary to cellulitis versus allergic reaction. Patient with no worsening symptoms however no significant improvement. Patient is speaking in full sentences her protecting her airway. Patient is drinking fluids and tolerated a cracker. Will admit the patient to telemetry floor. CT scan of the neck worrisome for cellulitis in the submandibular region.  Patient does have poor dentition. Will place empirically on IV Unasyn. Will continue empiric IV Benadryl, Pepcid, IV Solu-Medrol in case this has an allergic component to it. Follow.  #2 hypokalemia Replete.  #3 anemia Likely secondary to anemia in a menstruating female. . Will check an anemia panel. Follow H&H.  #4 history of endometrial cancer Followup with GYN as outpatient.  Code Status: Full Family Communication: Discussed with patient Disposition Plan:  Home when medically stable.  Time spent: 60 mins  [facility] Pager [phone]  If 7PM-7AM, please contact night-coverage [email or web address] Password [facility] 05/31/2012, 10:28 AM

## 2012-05-31 NOTE — Progress Notes (Signed)
ANTIBIOTIC CONSULT NOTE - INITIAL  Pharmacy Consult for Unasyn Indication: Cellulitis of submandibular gland  Allergies  Allergen Reactions  . Shellfish Allergy Anaphylaxis  . Tramadol Hives    Patient Measurements: Height: 5\' 4"  (162.6 cm) Weight: 165 lb (74.844 kg) IBW/kg (Calculated) : 54.7   Vital Signs: Temp: 98.3 F (36.8 C) (07/18 1208) Temp src: Oral (07/18 1208) BP: 127/80 mmHg (07/18 1208) Pulse Rate: 74  (07/18 1208) Intake/Output from previous day:   Intake/Output from this shift: Total I/O In: -  Out: 400 [Urine:400]  Labs:  [facility] 05/31/12 0500 05/28/12 2035  WBC 7.2 5.8  HGB 11.7* 12.4  PLT 405* 440*  LABCREA -- --  CREATININE 0.63 0.61   Estimated Creatinine Clearance: 91.6 ml/min (by C-G formula based on Cr of 0.63).   Microbiology: Recent Results (from the past 720 hour(s))  RAPID STREP SCREEN     Status: Normal   Collection Time   05/31/12  6:47 AM      Component Value Range Status Comment   Streptococcus, Group A Screen (Direct) NEGATIVE  NEGATIVE Final     Medical History: Past Medical History  Diagnosis Date  . Endometrial cancer   . Fibromyalgia   . Anemia 05/31/2012    Medications:  Scheduled:    . ampicillin-sulbactam (UNASYN) IV  3 g Intravenous Once  . diphenhydrAMINE  25 mg Intravenous Once  . diphenhydrAMINE  25 mg Intravenous Once  . diphenhydrAMINE  50 mg Intravenous Q6H  . docusate sodium  100 mg Oral BID  . enoxaparin (LOVENOX) injection  40 mg Subcutaneous Q24H  . EPINEPHrine  0.3 mg Intramuscular Once  . famotidine (PEPCID) IV  20 mg Intravenous Once  . famotidine (PEPCID) IV  20 mg Intravenous Q12H  . methylPREDNISolone (SOLU-MEDROL) injection  125 mg Intravenous Once  . methylPREDNISolone (SOLU-MEDROL) injection  80 mg Intravenous Q6H  . potassium chloride  40 mEq Oral Once  . sodium chloride  3 mL Intravenous Q12H   Infusions:    . sodium chloride 125 mL/hr at 05/31/12 1224   PRN: acetaminophen,  acetaminophen, alum & mag hydroxide-simeth, iohexol, morphine injection, ondansetron (ZOFRAN) IV, ondansetron, oxyCODONE, polyethylene glycol  Assessment:  40 YOF presents with neck swelling & shortness of breath  CT of the neck shows diffuse inflammation of the submandibular glands worrisome for cellulitis versus allergic reaction   Renal function is normal, therefore standard dose is appropriate  Goal of Therapy:  Eradication of infection, appropriate dose for renal function  Plan:   Unasyn 3gm IV q6h  Unasyn is currently in short supply - pharmacy will notify MD is unable to provide at any point Follow up renal function & cultures  Loralee Pacas, PharmD, BCPS Pager: [phone] 05/31/2012,12:23 PM

## 2012-05-31 NOTE — ED Notes (Signed)
Report given to karen, rn in [employer]

## 2012-05-31 NOTE — ED Provider Notes (Signed)
7:37 AM The patient continues with swelling of her submandibular region.  She has some warmth to this area without erythema.  Given the ongoing swelling there does not appear to be improving a CT scan will be obtained to evaluate for deep space abscess.  Her sublingual space is soft.  She has no dental tenderness at this time.  I do think this is all allergic reaction.  An additional 25 mg of Benadryl was given.  She'll continue to be monitored  8:54 AM I discussed case with radiology and she does have poor dentition and therefore this likely needs to be treated as a cellulitis of the neck.  Patient be given IV dose of Unasyn at this time she'll be admitted the hospital for ongoing observation.  She has no definable dental abscess that needs oral surgery drainage  Ct Soft Tissue Neck W Contrast  05/31/2012  *RADIOLOGY REPORT*  Clinical Data: Acute submandibular swelling.  Question allergic reaction versus Ludwigs  angina.  CT NECK WITH CONTRAST  Technique:  Multidetector CT imaging of the neck was performed with intravenous contrast.  Contrast: OMNIPAQUE IOHEXOL 300 MG/ML  SOLN  Comparison: None.  Findings:  Diffuse inflammation of the submandibular glands. Prominent surrounding inflammation/fluid extends throughout the adjacent soft tissues including fluid extending along the parapharyngeal space, superficial to the submandibular gland, surrounding the strap muscles reaching level of the thyroid gland and subcutaneous region. Asymmetric fluid within the floor of mouth slightly greater on the left.  Poor dentition.  Findings suggest diffuse cellulitis with allergic reaction a secondary less likely consideration.  Scattered increased number of predominately normal sized lymph nodes throughout the neck.  Slight asymmetry of the palatine tonsils with slight fullness on the left.  Mucosa abnormality not excluded.  Several small thyroid lesions largest measuring up to the 9 mm. These can be followed by the  thyroid ultrasound on an elective basis.  Biapical lung parenchymal changes suggestive of scarring without associated bony destruction.  No evidence of septic thrombophlebitis of the internal jugular vein.  Visualized orbital and intracranial structures  No retropharyngeal abscess or epidural abscess noted.  IMPRESSION: Diffuse inflammation of the submandibular glands.  Prominent surrounding inflammation/fluid extends throughout the adjacent soft tissues including fluid extending along the parapharyngeal space, superficial to the submandibular gland, surrounding the strap muscles reaching level of the thyroid gland and subcutaneous region. Asymmetric fluid within the floor of mouth slightly greater on the left.  Poor dentition.  Findings suggest diffuse cellulitis with allergic reaction a secondary less likely consideration.  Scattered increased number of predominately normal sized lymph nodes throughout the neck.  Slight asymmetry of the palatine tonsils with slight fullness on the left.  Mucosa abnormality not excluded.  Several small thyroid lesions largest measuring up to the 9 mm. These can be followed by the thyroid ultrasound on an elective basis.  Critical Value/emergent results were called by telephone at the time of interpretation on 05/31/2012 at 8:45 a.m. to Dr. Patria Mane, who verbally acknowledged these results.  Original Report Authenticated By: Fuller Canada, M.D.   Ct Abdomen Pelvis W Contrast  05/28/2012  *RADIOLOGY REPORT*  Clinical Data: Right lower quadrant and suprapubic pain for 3 days. Nausea and vomiting.  CT ABDOMEN AND PELVIS WITH CONTRAST  Technique:  Multidetector CT imaging of the abdomen and pelvis was performed following the standard protocol during bolus administration of intravenous contrast.  Contrast: OMNIPAQUE IOHEXOL 300 MG/ML  SOLN  Comparison: None.  Findings: Visualized lung bases are  clear.  The liver, spleen, gallbladder, pancreas, adrenal glands, abdominal aorta,  and retroperitoneal lymph nodes are unremarkable.  15 mm cyst in the lower pole of the right kidney.  Additional 5 mm cyst in the lower pole of the right kidney.  No solid mass or hydronephrosis in either kidney.  The stomach and small bowel are not distended.  Stool filled colon without distension.  No free air or free fluid in the abdomen.  Pelvis:  The uterus and adnexal structures are not enlarged.  The bladder is decompressed without apparent wall thickening.  Small amount of free fluid in the pelvis which may be physiologic.  The appendix is normal.  No evidence of diverticulitis.  Mild degenerative changes in the lumbar spine.  IMPRESSION: No acute process demonstrated in the abdomen or pelvis.  Original Report Authenticated By: Marlon Pel, M.D.   I personally reviewed the imaging tests through PACS system  I reviewed available ER/hospitalization records thought the EMR   Lyanne Co, MD 05/31/12 [phone]

## 2012-05-31 NOTE — ED Notes (Signed)
Pt awoke @ 4am with swelling noted to bilat neck

## 2012-05-31 NOTE — ED Provider Notes (Signed)
History     CSN: [id number]  Arrival date & time 05/31/12  0435   First MD Initiated Contact with Patient 05/31/12 [phone]      Chief Complaint  Patient presents with  . Lymphadenopathy    (Consider location/radiation/quality/duration/timing/severity/associated sxs/prior treatment) HPI 41 year old female presents to emergency department complaining of neck swelling, difficulty swallowing and breathing. Patient reports she had seafood last night for dinner, denies previous history of allergy to seafood. Patient reports she woke with tightness in her throat, and feeling like she couldn't swallow. Patient denies any tongue or lip swelling. She has not required spitting to handle oral secretions. She denies any wheezing, rash.  Past Medical History  Diagnosis Date  . Endometrial cancer   . Fibromyalgia     Past Surgical History  Procedure Date  . Total shoulder replacement     Family History  Problem Relation Age of Onset  . Coronary artery disease Father   . Heart attack Father   . Hypertension Other   . Diabetes Other   . Cancer Other     History  Substance Use Topics  . Smoking status: Current Everyday Smoker -- 0.5 packs/day    Types: Cigarettes  . Smokeless tobacco: Not on file  . Alcohol Use: No    OB History    Grav Para Term Preterm Abortions TAB SAB Ect Mult Living                  Review of Systems  All other systems reviewed and are negative.    Allergies  Shellfish allergy and Tramadol  Home Medications   Current Outpatient Rx  Name Route Sig Dispense Refill  . ACETAMINOPHEN 500 MG PO TABS Oral Take 1,000 mg by mouth every 6 (six) hours as needed. For pain    . ONDANSETRON 8 MG PO TBDP Oral Take 1 tablet (8 mg total) by mouth every 8 (eight) hours as needed for nausea. 20 tablet 0  . OXYCODONE-ACETAMINOPHEN 5-325 MG PO TABS Oral Take 1 tablet by mouth every 4 (four) hours as needed for pain. 15 tablet 0  . PROMETHAZINE HCL 25 MG RE SUPP Rectal  Place 1 suppository (25 mg total) rectally every 6 (six) hours as needed for nausea. 12 each 0    BP 125/81  Pulse 96  Temp 98 F (36.7 C) (Oral)  Resp 20  Ht 5\' 4"  (1.626 m)  Wt 165 lb (74.844 kg)  BMI 28.32 kg/m2  SpO2 100%  LMP 05/26/2012  Physical Exam  Nursing note and vitals reviewed. Constitutional: She appears well-developed and well-nourished. She appears distressed.  HENT:  Head: Normocephalic and atraumatic.  Right Ear: External ear normal.  Left Ear: External ear normal.  Nose: Nose normal.  Mouth/Throat: Oropharynx is clear and moist. No oropharyngeal exudate.  Eyes: Conjunctivae and EOM are normal. Pupils are equal, round, and reactive to light. Right eye exhibits no discharge. Left eye exhibits no discharge.  Neck: Normal range of motion. Neck supple. No JVD present. No tracheal deviation present.       Patient with significant swelling submandibular circumferential, tenderness with palpation  Cardiovascular: Normal rate, regular rhythm, normal heart sounds and intact distal pulses.  Exam reveals no gallop and no friction rub.   No murmur heard. Pulmonary/Chest: Effort normal and breath sounds normal. No stridor. No respiratory distress. She has no wheezes. She has no rales. She exhibits no tenderness.  Abdominal: Soft. Bowel sounds are normal. She exhibits no distension and no mass.  There is no tenderness. There is no rebound and no guarding.  Musculoskeletal: Normal range of motion. She exhibits no edema and no tenderness.  Lymphadenopathy:    She has cervical adenopathy.  Skin: Skin is warm and dry. No rash noted. She is not diaphoretic. No erythema. No pallor.    ED Course  Procedures (including critical care time)  CRITICAL CARE Performed by: Olivia Mackie   Total critical care time: 35 min  Critical care time was exclusive of separately billable procedures and treating other patients.  Critical care was necessary to treat or prevent imminent or  life-threatening deterioration.  Critical care was time spent personally by me on the following activities: development of treatment plan with patient and/or surrogate as well as nursing, discussions with consultants, evaluation of patient's response to treatment, examination of patient, obtaining history from patient or surrogate, ordering and performing treatments and interventions, ordering and review of laboratory studies, ordering and review of radiographic studies, pulse oximetry and re-evaluation of patient's condition.   Labs Reviewed  CBC WITH DIFFERENTIAL - Abnormal; Notable for the following:    Hemoglobin 11.7 (*)     HCT 34.6 (*)     Platelets 405 (*)     All other components within normal limits  BASIC METABOLIC PANEL - Abnormal; Notable for the following:    Potassium 3.4 (*)     All other components within normal limits  MONONUCLEOSIS SCREEN  RAPID STREP SCREEN   No results found.   No diagnosis found.    MDM  41 year old female with soft tissue swelling of her neck concerning for allergic reaction. She is to receive Benadryl Pepcid Solu-Medrol and EpiPen. Will closely monitor for worsening swelling      06:28. Patient reevaluated, no worsening of soft tissue swelling of the neck, but no significant improvement either. Patient reports she has had some runny nose over last few days but has not had sore throat prior to waking up with swelling in her throat. We'll check CBC rapid strep BMP and mono screen.  7:29 AM Care passed to Dr. Patria Mane awaiting results of remaining labs and continued monitoring of her soft tissue swelling   Olivia Mackie, MD 05/31/12 0730

## 2012-05-31 NOTE — ED Notes (Signed)
rn checked with md and wants benadryl to be administered 1330, to be 6 hours after last dose given at 0730. And solumedrol to be administered at 1130, to be given 6 hours after last dose at 0530.

## 2012-05-31 NOTE — ED Notes (Signed)
Pt alert and oriented x4. Respirations even and unlabored, bilateral symmetrical rise and fall of chest. Skin warm and dry. In no acute distress. Denies needs.   

## 2012-06-01 ENCOUNTER — Inpatient Hospital Stay (HOSPITAL_COMMUNITY): Payer: PRIVATE HEALTH INSURANCE

## 2012-06-01 DIAGNOSIS — D72829 Elevated white blood cell count, unspecified: Secondary | ICD-10-CM | POA: Diagnosis not present

## 2012-06-01 LAB — BASIC METABOLIC PANEL
BUN: 8 mg/dL (ref 6–23)
Creatinine, Ser: 0.62 mg/dL (ref 0.50–1.10)
GFR calc non Af Amer: >90 mL/min (ref >90)
Glucose, Bld: 158 mg/dL — ABNORMAL HIGH (ref 70–99)
Potassium: 3.6 mEq/L (ref 3.5–5.1)

## 2012-06-01 LAB — URINALYSIS, ROUTINE W REFLEX MICROSCOPIC
Glucose, UA: NEGATIVE mg/dL
Ketones, ur: NEGATIVE mg/dL
Protein, ur: NEGATIVE mg/dL
Urobilinogen, UA: 0.2 mg/dL (ref 0.0–1.0)

## 2012-06-01 LAB — CBC
HCT: 31.5 % — ABNORMAL LOW (ref 36.0–46.0)
Hemoglobin: 10.5 g/dL — ABNORMAL LOW (ref 12.0–15.0)
MCH: 27.5 pg (ref 26.0–34.0)
MCHC: 33.3 g/dL (ref 30.0–36.0)
RDW: 13.8 % (ref 11.5–15.5)

## 2012-06-01 LAB — URINE MICROSCOPIC-ADD ON

## 2012-06-01 MED ORDER — METHYLPREDNISOLONE SODIUM SUCC 125 MG IJ SOLR
80.0000 mg | Freq: Three times a day (TID) | INTRAMUSCULAR | Status: DC
  Administered 2012-06-01 – 2012-06-02 (×3): 80 mg via INTRAVENOUS
  Filled 2012-06-01 (×6): qty 1.28

## 2012-06-01 MED ORDER — DIPHENHYDRAMINE HCL 50 MG/ML IJ SOLN
25.0000 mg | Freq: Three times a day (TID) | INTRAMUSCULAR | Status: DC
  Administered 2012-06-01: 25 mg via INTRAVENOUS
  Filled 2012-06-01 (×2): qty 1

## 2012-06-01 MED ORDER — PHENOL 1.4 % MT LIQD
1.0000 | OROMUCOSAL | Status: DC | PRN
  Administered 2012-06-01: 1 via OROMUCOSAL
  Filled 2012-06-01: qty 177

## 2012-06-01 MED ORDER — DIPHENHYDRAMINE HCL 50 MG/ML IJ SOLN
25.0000 mg | Freq: Three times a day (TID) | INTRAMUSCULAR | Status: DC
  Administered 2012-06-01 – 2012-06-02 (×2): 25 mg via INTRAVENOUS
  Filled 2012-06-01: qty 1
  Filled 2012-06-01 (×3): qty 0.5

## 2012-06-01 MED ORDER — MAGNESIUM SULFATE 40 MG/ML IJ SOLN
2.0000 g | Freq: Once | INTRAMUSCULAR | Status: AC
  Administered 2012-06-01: 2 g via INTRAVENOUS
  Filled 2012-06-01: qty 50

## 2012-06-01 MED ORDER — NICOTINE 14 MG/24HR TD PT24
14.0000 mg | MEDICATED_PATCH | Freq: Every day | TRANSDERMAL | Status: DC
  Administered 2012-06-01 – 2012-06-03 (×3): 14 mg via TRANSDERMAL
  Filled 2012-06-01 (×3): qty 1

## 2012-06-01 NOTE — Progress Notes (Signed)
[facility] PROGRESS NOTE  Bruce Mayers ZOX:[id number] DOB: 1971-08-30 DOA: 05/31/2012 PCP: No primary provider on file.  Assessment/Plan: Principal Problem:  *Swelling of submandibular region Active Problems:  Anemia  Hypokalemia  Leukocytosis  #1 Swelling of the submandibular region  Likely secondary to cellulitis versus allergic reaction. Patient with clinical improvement. Patient is speaking in full sentences and protecting her airway. Patient is drinking fluids and tolerated a regular diet. CT scan of the neck worrisome for cellulitis in the submandibular region. Patient does have poor dentition. Continue IV Unasyn, taper IV Benadryl, Pepcid, IV Solu-Medrol in case this has an allergic component to it. Follow.  #2 hypokalemia  Replete.  #3 Iron deficiency anemia  Likely secondary to anemia in a menstruating female.  Follow H&H.  #4 history of endometrial cancer  Followup with GYN as outpatient #5 Leukocytosis Likely secondary to steriods and #1. Follow. Continue empiric antibiotics. Follow.    Code Status: full Family Communication: Updated patient. Disposition Plan: Home when medically stable   Brief narrative: Autumn Gordon is a 41 year old African American female with a history of endometrial cancer per patient, fibromyalgia, anemia who presents to the ED with a several hour history of neck swelling difficulty swallowing neck pain which awoke the patient at 4 AM on the day of admission. Patient states that the night prior to admission had some fried oysters/seafood at that at approximately 10:30 PM denies any prior history of allergy to seafood. Patient states awoke at 4 AM on the day of admission with tightness in the throat feeling like she couldn't swallow and some shortness of breath. Patient denies any fever, no chills, no cough, no nausea, no vomiting, no chest pain, no diarrhea, no constipation, no dysuria, no abdominal pain, no generalized  weakness. Patient denies any other associated symptoms. Patient was seen in the ED and treated with IV Benadryl Pepcid and Solu-Medrol and allergic reaction. CT of the neck was obtained which did show diffuse inflammation of the submandibular glands worrisome for cellulitis versus allergic reaction as a secondary etiology. Patient was given a dose of IV Unasyn. Will call to admit the patient for further evaluation and management. Patient is speaking in full sentences. Patient just ate a cracker without any difficulties. Patient states that she doesn't feel significantly better however shortness of breath has improved.   Consultants:  None  Procedures:  CT neck 05/31/12  Antibiotics:  Unasyn 05/31/12  HPI/Subjective: Patient states swelling improved. No trouble swallowing. Feeling better.  Objective: Filed Vitals:   05/31/12 1208 05/31/12 2203 06/01/12 0631 06/01/12 1330  BP: 127/80 136/75 116/77 130/77  Pulse: 74 61 62 72  Temp: 98.3 F (36.8 C) 97.9 F (36.6 C) 97.9 F (36.6 C) 98.5 F (36.9 C)  TempSrc: Oral Oral Oral Oral  Resp: 18 18 18 20   Height:      Weight:   74.8 kg (164 lb 14.5 oz)   SpO2: 100% 100% 96% 98%    Intake/Output Summary (Last 24 hours) at 06/01/12 1818 Last data filed at 06/01/12 0900  Gross per 24 hour  Intake    240 ml  Output    500 ml  Net   -260 ml    Exam: General: Alert, awake, oriented x3, in no acute distress. HEENT: Decreased submandibular swelling. Oropharynx clear, no lesions, no exudate. Heart: Regular rate and rhythm, without murmurs, rubs, gallops. Lungs: Clear to auscultation bilaterally. Abdomen: Soft, nontender, nondistended, positive bowel sounds. Extremities: No clubbing cyanosis or edema with positive pedal pulses.  Neuro: Grossly intact, nonfocal.  Data Reviewed: Basic Metabolic Panel:  Lab 06/01/12 4696 05/31/12 1053 05/31/12 0500 05/28/12 2035 05/27/12 0046  NA 140 -- 138 136 138  K 3.6 -- 3.4* 3.8 3.6  CL 106 -- 102  100 104  CO2 23 -- 25 25 21   GLUCOSE 158* -- 83 85 110*  BUN 8 -- 9 7 10   CREATININE 0.62 -- 0.63 0.61 0.58  CALCIUM 8.9 -- 9.3 9.7 9.2  MG -- 1.7 -- -- --  PHOS -- -- -- -- --   Liver Function Tests:  Lab 05/27/12 0046  AST 20  ALT 9  ALKPHOS 53  BILITOT 0.1*  PROT 7.5  ALBUMIN 3.9   No results found for this basename: LIPASE:5,AMYLASE:5 in the last 168 hours No results found for this basename: AMMONIA:5 in the last 168 hours CBC:  Lab 06/01/12 0445 05/31/12 0500 05/28/12 2035 05/27/12 0046  WBC 16.1* 7.2 5.8 6.9  NEUTROABS -- 4.0 3.0 --  HGB 10.5* 11.7* 12.4 11.9*  HCT 31.5* 34.6* 36.8 34.8*  MCV 82.5 82.6 82.3 81.1  PLT 343 405* 440* 441*   Cardiac Enzymes: No results found for this basename: CKTOTAL:5,CKMB:5,CKMBINDEX:5,TROPONINI:5 in the last 168 hours BNP (last 3 results) No results found for this basename: PROBNP:3 in the last 8760 hours CBG: No results found for this basename: GLUCAP:5 in the last 168 hours  Recent Results (from the past 240 hour(s))  RAPID STREP SCREEN     Status: Normal   Collection Time   05/31/12  6:47 AM      Component Value Range Status Comment   Streptococcus, Group A Screen (Direct) NEGATIVE  NEGATIVE Final      Studies: Dg Chest 2 View  06/01/2012  *RADIOLOGY REPORT*  Clinical Data:  Leukocytosis, shortness of breath, COPD, asthma  CHEST - 2 VIEW  Comparison: None  Findings: Normal heart size, mediastinal contours, and pulmonary vascularity. EKG lead projects over right upper lobe. Lungs clear. No pleural effusion or pneumothorax. Prior right humeral head replacement. Avascular necrosis left humeral head. Retained barium within colon. No acute osseous findings.  IMPRESSION: No acute cardiopulmonary abnormalities. 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  Original Report Authenticated By: Lollie Marrow, M.D.   Ct Soft Tissue Neck W Contrast  05/31/2012  *RADIOLOGY REPORT*  Clinical  Data: Acute submandibular swelling.  Question allergic reaction versus Ludwigs  angina.  CT NECK WITH CONTRAST  Technique:  Multidetector CT imaging of the neck was performed with intravenous contrast.  Contrast: OMNIPAQUE IOHEXOL 300 MG/ML  SOLN  Comparison: None.  Findings:  Diffuse inflammation of the submandibular glands. Prominent surrounding inflammation/fluid extends throughout the adjacent soft tissues including fluid extending along the parapharyngeal space, superficial to the submandibular gland, surrounding the strap muscles reaching level of the thyroid gland and subcutaneous region. Asymmetric fluid within the floor of mouth slightly greater on the left.  Poor dentition.  Findings suggest diffuse cellulitis with allergic reaction a secondary less likely consideration.  Scattered increased number of predominately normal sized lymph nodes throughout the neck.  Slight asymmetry of the palatine tonsils with slight fullness on the left.  Mucosa abnormality not excluded.  Several small thyroid lesions largest measuring up to the 9 mm. These can be followed by the thyroid ultrasound on an elective basis.  Biapical lung parenchymal changes suggestive of scarring without associated bony destruction.  No evidence of septic thrombophlebitis of the internal jugular vein.  Visualized orbital and intracranial structures  No retropharyngeal abscess or epidural abscess noted.  IMPRESSION: Diffuse inflammation of the submandibular glands.  Prominent surrounding inflammation/fluid extends throughout the adjacent soft tissues including fluid extending along the parapharyngeal space, superficial to the submandibular gland, surrounding the strap muscles reaching level of the thyroid gland and subcutaneous region. Asymmetric fluid within the floor of mouth slightly greater on the left.  Poor dentition.  Findings suggest diffuse cellulitis with allergic reaction a secondary less likely consideration.  Scattered  increased number of predominately normal sized lymph nodes throughout the neck.  Slight asymmetry of the palatine tonsils with slight fullness on the left.  Mucosa abnormality not excluded.  Several small thyroid lesions largest measuring up to the 9 mm. These can be followed by the thyroid ultrasound on an elective basis.  Critical Value/emergent results were called by telephone at the time of interpretation on 05/31/2012 at 8:45 a.m. to Dr. Patria Mane, who verbally acknowledged these results.  Original Report Authenticated By: Fuller Canada, M.D.   Ct Abdomen Pelvis W Contrast  05/28/2012  *RADIOLOGY REPORT*  Clinical Data: Right lower quadrant and suprapubic pain for 3 days. Nausea and vomiting.  CT ABDOMEN AND PELVIS WITH CONTRAST  Technique:  Multidetector CT imaging of the abdomen and pelvis was performed following the standard protocol during bolus administration of intravenous contrast.  Contrast: OMNIPAQUE IOHEXOL 300 MG/ML  SOLN  Comparison: None.  Findings: Visualized lung bases are clear.  The liver, spleen, gallbladder, pancreas, adrenal glands, abdominal aorta, and retroperitoneal lymph nodes are unremarkable.  15 mm cyst in the lower pole of the right kidney.  Additional 5 mm cyst in the lower pole of the right kidney.  No solid mass or hydronephrosis in either kidney.  The stomach and small bowel are not distended.  Stool filled colon without distension.  No free air or free fluid in the abdomen.  Pelvis:  The uterus and adnexal structures are not enlarged.  The bladder is decompressed without apparent wall thickening.  Small amount of free fluid in the pelvis which may be physiologic.  The appendix is normal.  No evidence of diverticulitis.  Mild degenerative changes in the lumbar spine.  IMPRESSION: No acute process demonstrated in the abdomen or pelvis.  Original Report Authenticated By: Marlon Pel, M.D.    Scheduled Meds:   . ampicillin-sulbactam (UNASYN) IV  3 g Intravenous  Q6H  . diphenhydrAMINE  25 mg Intravenous Q8H  . docusate sodium  100 mg Oral BID  . enoxaparin (LOVENOX) injection  40 mg Subcutaneous Q24H  . famotidine (PEPCID) IV  20 mg Intravenous Q12H  . magnesium sulfate 1 - 4 g bolus IVPB  2 g Intravenous Once  . methylPREDNISolone (SOLU-MEDROL) injection  80 mg Intravenous Q8H  . nicotine  14 mg Transdermal Daily  . sodium chloride  3 mL Intravenous Q12H  . DISCONTD: diphenhydrAMINE  25 mg Intravenous Q8H  . DISCONTD: diphenhydrAMINE  50 mg Intravenous Q6H  . DISCONTD: methylPREDNISolone (SOLU-MEDROL) injection  80 mg Intravenous Q6H   Continuous Infusions:   . sodium chloride Stopped (06/01/12 1354)    Principal Problem:  *Swelling of submandibular region Active Problems:  Anemia  Hypokalemia  Leukocytosis    Time spent: 35 mins    [facility]  [facility] Pager [phone]. If 8PM-8AM, please contact night-coverage at [email or web address], password [facility] 06/01/2012, 6:18 PM  LOS: 1 day

## 2012-06-01 NOTE — Progress Notes (Signed)
   CARE MANAGEMENT NOTE 06/01/2012  Patient:  Autumn Gordon, Autumn Gordon   Account Number:  [id number]  Date Initiated:  06/01/2012  Documentation initiated by:  [facility]  Subjective/Objective Assessment:   41 year old female admitted with swelling of neck/? cellulitis.     Action/Plan:   Pt will be discharged home when medically stable.   Anticipated DC Date:  06/04/2012   Anticipated DC Plan:  HOME/SELF CARE  Comments:  06/01/12  Received order for medication assistance for patient. Spoke with pt who told me she doe have insurance that covers some part of her prescription cost. Pt cannot therefore qualify for medication assistance. I spoke to her about using the [facility] pharmacy hence she can get her medications at a reduced cost.  I also gave patient healh connect # so that she can get established with a PCP.

## 2012-06-01 NOTE — Progress Notes (Signed)
Brief Nutrition Note  Patient identified on the Nutrition Risk Report for difficulty swallowing.   Body mass index is 28.31 kg/(m^2). Pt meets criteria for overweight based on current BMI.   - Pt reports eating seafood PTA, a small amount of fried oysters, and then having neck swelling later that night with a sensation of being unable to swallow with some shortness of breath. Met with pt who states nothing like this has happened to her before. 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 Pt states her mom and daughter are allergic to shellfish and pt's daughter is allergic to peanuts and red dye as well. Pt reports other than this episode, she eats 3 meals/day, good appetite, no changes in weight, and typically no problems swallowing food. Pt reports improvement in swelling and swallowing function. Pt on regular diet which pt reports tolerating well.   No further nutrition interventions warranted at this time. If additional nutrition issues arise, please re-consult RD.   Dietitian# [phone]

## 2012-06-02 LAB — BASIC METABOLIC PANEL
BUN: 10 mg/dL (ref 6–23)
Chloride: 103 mEq/L (ref 96–112)
Creatinine, Ser: 0.59 mg/dL (ref 0.50–1.10)
Glucose, Bld: 113 mg/dL — ABNORMAL HIGH (ref 70–99)
Potassium: 3.7 mEq/L (ref 3.5–5.1)

## 2012-06-02 LAB — CBC WITH DIFFERENTIAL/PLATELET
Basophils Relative: 0 % (ref 0–1)
Eosinophils Absolute: 0 10*3/uL (ref 0.0–0.7)
Eosinophils Relative: 0 % (ref 0–5)
HCT: 30.4 % — ABNORMAL LOW (ref 36.0–46.0)
Hemoglobin: 9.9 g/dL — ABNORMAL LOW (ref 12.0–15.0)
MCH: 27 pg (ref 26.0–34.0)
MCHC: 32.6 g/dL (ref 30.0–36.0)
MCV: 82.8 fL (ref 78.0–100.0)
Monocytes Absolute: 0.6 10*3/uL (ref 0.1–1.0)
Monocytes Relative: 3 % (ref 3–12)
RDW: 13.9 % (ref 11.5–15.5)

## 2012-06-02 MED ORDER — METHYLPREDNISOLONE SODIUM SUCC 125 MG IJ SOLR
80.0000 mg | Freq: Two times a day (BID) | INTRAMUSCULAR | Status: DC
  Administered 2012-06-02 – 2012-06-03 (×3): 80 mg via INTRAVENOUS
  Filled 2012-06-02 (×4): qty 1.28

## 2012-06-02 MED ORDER — DIPHENHYDRAMINE HCL 50 MG/ML IJ SOLN
25.0000 mg | Freq: Two times a day (BID) | INTRAMUSCULAR | Status: DC
  Administered 2012-06-02 – 2012-06-03 (×2): 25 mg via INTRAVENOUS
  Filled 2012-06-02 (×2): qty 0.5
  Filled 2012-06-02 (×2): qty 1

## 2012-06-02 NOTE — Progress Notes (Signed)
[facility] PROGRESS NOTE  Autumn Gordon WUJ:[id number] DOB: [birth year] DOA: 05/31/2012 PCP: No primary provider on file.  Assessment/Plan: Principal Problem:  *Swelling of submandibular region Active Problems:  Anemia  Hypokalemia  Leukocytosis  #1 Swelling of the submandibular region  Likely secondary to cellulitis versus allergic reaction. Patient with clinical improvement. Patient is speaking in full sentences and protecting her airway. Patient is drinking fluids and tolerated a regular diet. CT scan of the neck worrisome for cellulitis in the submandibular region. Patient does have poor dentition. Continue IV Unasyn, taper IV Benadryl, Pepcid, IV Solu-Medrol in case this has an allergic component to it. Follow.  #2 hypokalemia  Replete.  #3 Iron deficiency anemia  Likely secondary to anemia in a menstruating female.  Follow H&H.  #4 history of endometrial cancer  Followup with GYN as outpatient #5 Leukocytosis Likely secondary to steriods and #1. WBC keeps trending upward. Will taper steroids and monitor. Urinalysis is negative. Chest x-ray is negative. Blood cultures are pending. Patient is afebrile. Patient with clinical improvement. Continue empiric antibiotics. Follow.    Code Status: full Family Communication: Updated patient. Disposition Plan: Home when medically stable   Brief narrative: Autumn Gordon is a 41 year old African American female with a history of endometrial cancer per patient, fibromyalgia, anemia who presents to the ED with a several hour history of neck swelling difficulty swallowing neck pain which awoke the patient at 4 AM on the day of admission. Patient states that the night prior to admission had some fried oysters/seafood at that at approximately 10:30 PM denies any prior history of allergy to seafood. Patient states awoke at 4 AM on the day of admission with tightness in the throat feeling like she couldn't swallow and some  shortness of breath. Patient denies any fever, no chills, no cough, no nausea, no vomiting, no chest pain, no diarrhea, no constipation, no dysuria, no abdominal pain, no generalized weakness. Patient denies any other associated symptoms. Patient was seen in the ED and treated with IV Benadryl Pepcid and Solu-Medrol and allergic reaction. CT of the neck was obtained which did show diffuse inflammation of the submandibular glands worrisome for cellulitis versus allergic reaction as a secondary etiology. Patient was given a dose of IV Unasyn. Will call to admit the patient for further evaluation and management. Patient is speaking in full sentences. Patient just ate a cracker without any difficulties. Patient states that she doesn't feel significantly better however shortness of breath has improved.   Consultants:  None  Procedures:  CT neck 05/31/12  Antibiotics:  Unasyn 05/31/12  HPI/Subjective: Patient states swelling improved. No trouble swallowing. Feeling better.  Objective: Filed Vitals:   06/01/12 1330 06/01/12 2212 06/02/12 0626 06/02/12 1405  BP: 130/77 140/76 145/71 132/81  Pulse: 72 62 52 63  Temp: 98.5 F (36.9 C) 98.3 F (36.8 C) 98.2 F (36.8 C) 99.6 F (37.6 C)  TempSrc: Oral Oral Oral Oral  Resp: 20 18 18 18   Height:      Weight:   79.969 kg (176 lb 4.8 oz)   SpO2: 98% 100% 97% 98%    Intake/Output Summary (Last 24 hours) at 06/02/12 1742 Last data filed at 06/01/12 1828  Gross per 24 hour  Intake    360 ml  Output    400 ml  Net    -40 ml    Exam: General: Alert, awake, oriented x3, in no acute distress. HEENT: Decreased submandibular swelling. Oropharynx clear, no lesions, no exudate. Heart: Regular rate and  rhythm, without murmurs, rubs, gallops. Lungs: Clear to auscultation bilaterally. Abdomen: Soft, nontender, nondistended, positive bowel sounds. Extremities: No clubbing cyanosis or edema with positive pedal pulses. Neuro: Grossly intact,  nonfocal.  Data Reviewed: Basic Metabolic Panel:  Lab 06/02/12 1610 06/01/12 0445 05/31/12 1053 05/31/12 0500 05/28/12 2035 05/27/12 0046  NA 139 140 -- 138 136 138  K 3.7 3.6 -- 3.4* 3.8 3.6  CL 103 106 -- 102 100 104  CO2 26 23 -- 25 25 21   GLUCOSE 113* 158* -- 83 85 110*  BUN 10 8 -- 9 7 10   CREATININE 0.59 0.62 -- 0.63 0.61 0.58  CALCIUM 8.7 8.9 -- 9.3 9.7 9.2  MG -- -- 1.7 -- -- --  PHOS -- -- -- -- -- --   Liver Function Tests:  Lab 05/27/12 0046  AST 20  ALT 9  ALKPHOS 53  BILITOT 0.1*  PROT 7.5  ALBUMIN 3.9   No results found for this basename: LIPASE:5,AMYLASE:5 in the last 168 hours No results found for this basename: AMMONIA:5 in the last 168 hours CBC:  Lab 06/02/12 0625 06/01/12 0445 05/31/12 0500 05/28/12 2035 05/27/12 0046  WBC 20.6* 16.1* 7.2 5.8 6.9  NEUTROABS 18.7* -- 4.0 3.0 --  HGB 9.9* 10.5* 11.7* 12.4 11.9*  HCT 30.4* 31.5* 34.6* 36.8 34.8*  MCV 82.8 82.5 82.6 82.3 81.1  PLT 368 343 405* 440* 441*   Cardiac Enzymes: No results found for this basename: CKTOTAL:5,CKMB:5,CKMBINDEX:5,TROPONINI:5 in the last 168 hours BNP (last 3 results) No results found for this basename: PROBNP:3 in the last 8760 hours CBG: No results found for this basename: GLUCAP:5 in the last 168 hours  Recent Results (from the past 240 hour(s))  RAPID STREP SCREEN     Status: Normal   Collection Time   05/31/12  6:47 AM      Component Value Range Status Comment   Streptococcus, Group A Screen (Direct) NEGATIVE  NEGATIVE Final   CULTURE, BLOOD (ROUTINE X 2)     Status: Normal (Preliminary result)   Collection Time   06/01/12  8:00 AM      Component Value Range Status Comment   Specimen Description BLOOD RIGHT HAND   Final    Special Requests BOTTLES DRAWN AEROBIC ONLY 1CC   Final    Culture  Setup Time 06/01/2012 10:27   Final    Culture     Final    Value:        BLOOD CULTURE RECEIVED NO GROWTH TO DATE CULTURE WILL BE HELD FOR 5 DAYS BEFORE ISSUING A FINAL NEGATIVE  REPORT   Report Status PENDING   Incomplete   CULTURE, BLOOD (ROUTINE X 2)     Status: Normal (Preliminary result)   Collection Time   06/01/12  8:05 AM      Component Value Range Status Comment   Specimen Description BLOOD LEFT HAND   Final    Special Requests BOTTLES DRAWN AEROBIC AND ANAEROBIC 5CC EACH   Final    Culture  Setup Time 06/01/2012 10:27   Final    Culture     Final    Value:        BLOOD CULTURE RECEIVED NO GROWTH TO DATE CULTURE WILL BE HELD FOR 5 DAYS BEFORE ISSUING A FINAL NEGATIVE REPORT   Report Status PENDING   Incomplete      Studies: Dg Chest 2 View  06/01/2012  *RADIOLOGY REPORT*  Clinical Data:  Leukocytosis, shortness of breath, COPD, asthma  CHEST -  2 VIEW  Comparison: None  Findings: Normal heart size, mediastinal contours, and pulmonary vascularity. EKG lead projects over right upper lobe. Lungs clear. No pleural effusion or pneumothorax. Prior right humeral head replacement. Avascular necrosis left humeral head. Retained barium within colon. No acute osseous findings.  IMPRESSION: No acute cardiopulmonary abnormalities. 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  Original Report Authenticated By: Lollie Marrow, M.D.   Ct Soft Tissue Neck W Contrast  05/31/2012  *RADIOLOGY REPORT*  Clinical Data: Acute submandibular swelling.  Question allergic reaction versus Ludwigs  angina.  CT NECK WITH CONTRAST  Technique:  Multidetector CT imaging of the neck was performed with intravenous contrast.  Contrast: OMNIPAQUE IOHEXOL 300 MG/ML  SOLN  Comparison: None.  Findings:  Diffuse inflammation of the submandibular glands. Prominent surrounding inflammation/fluid extends throughout the adjacent soft tissues including fluid extending along the parapharyngeal space, superficial to the submandibular gland, surrounding the strap muscles reaching level of the thyroid gland and subcutaneous region. Asymmetric fluid within the  floor of mouth slightly greater on the left.  Poor dentition.  Findings suggest diffuse cellulitis with allergic reaction a secondary less likely consideration.  Scattered increased number of predominately normal sized lymph nodes throughout the neck.  Slight asymmetry of the palatine tonsils with slight fullness on the left.  Mucosa abnormality not excluded.  Several small thyroid lesions largest measuring up to the 9 mm. These can be followed by the thyroid ultrasound on an elective basis.  Biapical lung parenchymal changes suggestive of scarring without associated bony destruction.  No evidence of septic thrombophlebitis of the internal jugular vein.  Visualized orbital and intracranial structures  No retropharyngeal abscess or epidural abscess noted.  IMPRESSION: Diffuse inflammation of the submandibular glands.  Prominent surrounding inflammation/fluid extends throughout the adjacent soft tissues including fluid extending along the parapharyngeal space, superficial to the submandibular gland, surrounding the strap muscles reaching level of the thyroid gland and subcutaneous region. Asymmetric fluid within the floor of mouth slightly greater on the left.  Poor dentition.  Findings suggest diffuse cellulitis with allergic reaction a secondary less likely consideration.  Scattered increased number of predominately normal sized lymph nodes throughout the neck.  Slight asymmetry of the palatine tonsils with slight fullness on the left.  Mucosa abnormality not excluded.  Several small thyroid lesions largest measuring up to the 9 mm. These can be followed by the thyroid ultrasound on an elective basis.  Critical Value/emergent results were called by telephone at the time of interpretation on 05/31/2012 at 8:45 a.m. to Dr. Patria Mane, who verbally acknowledged these results.  Original Report Authenticated By: Fuller Canada, M.D.   Ct Abdomen Pelvis W Contrast  05/28/2012  *RADIOLOGY REPORT*  Clinical Data: Right  lower quadrant and suprapubic pain for 3 days. Nausea and vomiting.  CT ABDOMEN AND PELVIS WITH CONTRAST  Technique:  Multidetector CT imaging of the abdomen and pelvis was performed following the standard protocol during bolus administration of intravenous contrast.  Contrast: OMNIPAQUE IOHEXOL 300 MG/ML  SOLN  Comparison: None.  Findings: Visualized lung bases are clear.  The liver, spleen, gallbladder, pancreas, adrenal glands, abdominal aorta, and retroperitoneal lymph nodes are unremarkable.  15 mm cyst in the lower pole of the right kidney.  Additional 5 mm cyst in the lower pole of the right kidney.  No solid mass or hydronephrosis in either kidney.  The stomach and small bowel are not distended.  Stool filled colon without  distension.  No free air or free fluid in the abdomen.  Pelvis:  The uterus and adnexal structures are not enlarged.  The bladder is decompressed without apparent wall thickening.  Small amount of free fluid in the pelvis which may be physiologic.  The appendix is normal.  No evidence of diverticulitis.  Mild degenerative changes in the lumbar spine.  IMPRESSION: No acute process demonstrated in the abdomen or pelvis.  Original Report Authenticated By: Marlon Pel, M.D.    Scheduled Meds:    . ampicillin-sulbactam (UNASYN) IV  3 g Intravenous Q6H  . diphenhydrAMINE  25 mg Intravenous BID  . docusate sodium  100 mg Oral BID  . famotidine (PEPCID) IV  20 mg Intravenous Q12H  . methylPREDNISolone (SOLU-MEDROL) injection  80 mg Intravenous Q12H  . nicotine  14 mg Transdermal Daily  . sodium chloride  3 mL Intravenous Q12H  . DISCONTD: diphenhydrAMINE  25 mg Intravenous Q8H  . DISCONTD: enoxaparin (LOVENOX) injection  40 mg Subcutaneous Q24H  . DISCONTD: methylPREDNISolone (SOLU-MEDROL) injection  80 mg Intravenous Q8H   Continuous Infusions:   Principal Problem:  *Swelling of submandibular region Active Problems:  Anemia  Hypokalemia   Leukocytosis    Time spent: 35 mins    [facility]  [facility] Pager [phone]. If 8PM-8AM, please contact night-coverage at [email or web address], password [facility] 06/02/2012, 5:42 PM  LOS: 2 days

## 2012-06-03 DIAGNOSIS — T7840XA Allergy, unspecified, initial encounter: Secondary | ICD-10-CM

## 2012-06-03 LAB — CBC WITH DIFFERENTIAL/PLATELET
Basophils Absolute: 0 10*3/uL (ref 0.0–0.1)
Basophils Relative: 0 % (ref 0–1)
Eosinophils Absolute: 0 10*3/uL (ref 0.0–0.7)
Hemoglobin: 10.5 g/dL — ABNORMAL LOW (ref 12.0–15.0)
MCH: 27.3 pg (ref 26.0–34.0)
MCHC: 32.7 g/dL (ref 30.0–36.0)
Monocytes Relative: 5 % (ref 3–12)
Neutrophils Relative %: 87 % — ABNORMAL HIGH (ref 43–77)
Platelets: 404 10*3/uL — ABNORMAL HIGH (ref 150–400)
RDW: 13.8 % (ref 11.5–15.5)

## 2012-06-03 LAB — BASIC METABOLIC PANEL
BUN: 11 mg/dL (ref 6–23)
GFR calc Af Amer: >90 mL/min (ref >90)
GFR calc non Af Amer: >90 mL/min (ref >90)
Potassium: 3.9 mEq/L (ref 3.5–5.1)

## 2012-06-03 LAB — URINE CULTURE
Colony Count: NO GROWTH
Culture: NO GROWTH

## 2012-06-03 MED ORDER — FAMOTIDINE 20 MG PO TABS: 20.0000 mg | ORAL_TABLET | Freq: Two times a day (BID) | ORAL | Status: DC

## 2012-06-03 MED ORDER — AMOXICILLIN-POT CLAVULANATE 875-125 MG PO TABS
1.0000 | ORAL_TABLET | Freq: Two times a day (BID) | ORAL | Status: DC
  Filled 2012-06-03 (×2): qty 1

## 2012-06-03 MED ORDER — DIPHENHYDRAMINE HCL 25 MG PO TABS: 25.0000 mg | ORAL_TABLET | Freq: Every day | ORAL | Status: DC

## 2012-06-03 MED ORDER — EPINEPHRINE 0.3 MG/0.3ML IJ DEVI: 0.3000 mg | Freq: Once | INTRAMUSCULAR | Status: DC

## 2012-06-03 MED ORDER — PREDNISONE 20 MG PO TABS: 20.0000 mg | ORAL_TABLET | Freq: Every day | ORAL | Status: AC

## 2012-06-03 MED ORDER — AMOXICILLIN-POT CLAVULANATE 875-125 MG PO TABS: 1.0000 | ORAL_TABLET | Freq: Two times a day (BID) | ORAL | Status: AC

## 2012-06-03 MED ORDER — OXYCODONE HCL 5 MG PO TABS: 5.0000 mg | ORAL_TABLET | ORAL | Status: AC | PRN

## 2012-06-03 NOTE — Discharge Summary (Signed)
Physician Discharge Summary  Autumn Gordon ZOX:[id number] DOB: [birth year] DOA: 05/31/2012  PCP: No primary provider on file.  Admit date: 05/31/2012 Discharge date: 06/03/2012  Recommendations for Outpatient Follow-up:  1. Patient is to pick a PCP and followup with her PCP one week post discharge to followup on her swelling of the submandibular region which is being treated both as a cellulitis and allergic reaction. Patient will need a repeat CBC done on followup to followup on her leukocytosis.  Discharge Diagnoses:  Principal Problem:  *Swelling of submandibular region Active Problems:  Anemia  Hypokalemia  Leukocytosis   Discharge Condition: Stable and improved  Diet recommendation: General  History of present illness:  Autumn Gordon is a 41 year old African American female with a history of endometrial cancer per patient, fibromyalgia, anemia who presents to the ED with a several hour history of neck swelling difficulty swallowing neck pain which awoke the patient at 4 AM on the day of admission. Patient states that the night prior to admission had some fried oysters/seafood at that at approximately 10:30 PM denies any prior history of allergy to seafood. Patient states awoke at 4 AM on the day of admission with tightness in the throat feeling like she couldn't swallow and some shortness of breath. Patient denies any fever, no chills, no cough, no nausea, no vomiting, no chest pain, no diarrhea, no constipation, no dysuria, no abdominal pain, no generalized weakness. Patient denies any other associated symptoms. Patient was seen in the ED and treated with IV Benadryl Pepcid and Solu-Medrol and allergic reaction. CT of the neck was obtained which did show diffuse inflammation of the submandibular glands worrisome for cellulitis versus allergic reaction as a secondary etiology. Patient was given a dose of IV Unasyn. Will call to admit the patient for further evaluation and  management. Patient is speaking in full sentences. Patient just ate a cracker without any difficulties. Patient states that she doesn't feel significantly better however shortness of breath has improved. For the rest of admission history and physical please see H&P dictated by Dr. Janee Morn.   Hospital Course:  #1 Swelling of the submandibular region  Patient was admitted with a swelling of the submandibular region. It was felt it was  secondary to cellulitis versus allergic reaction. Patient with no worsening symptoms however no significant improvement at the time of admission in the ED.Marland Kitchen Patient is speaking in full sentences her protecting her airway. Patient is drinking fluids and tolerated a cracker. Patient was admitted to the telemetry floor. CT scan of the neck which was done was worrisome for cellulitis in the submandibular region. Patient did have poor dentition. Patient was placed empirically on IV Unasyn and continued on empiric IV Benadryl, Pepcid, IV Solu-Medrol in case this has an allergic component to it. Patient improved clinically on a daily basis. IV Benadryl was subsequently transitioned to oral Benadryl, Pepcid and IV Solu-Medrol with taper down. Patient improved clinically she did have a leukocytosis however this was felt to be likely secondary to steroids. As with the steroid taper patient's leukocytosis improved. Patient will be discharged home on 8 more days of oral Augmentin to complete a ten-day course of antibiotic therapy. Patient will also be discharged home on a steroid taper Benadryl taper in the Pepcid taper. Patient will also be given an EpiPen. Patient will need to followup with PCP as outpatient one week post discharge.  #2 hypokalemia  During the hospitalization patient was noted to be hypokalemic. Patient's potassium was repleted and her hypokalemia  had resolved by day of discharge. #3 anemia  Patient was noted to be anemic during the hospitalization however was  asymptomatic and did not require any transfusions. Was felt patient's anemia was secondary to a menstruating female secondary to iron deficiency. Patient in need to followup with PCP as outpatient.  #4 leukocytosis During the hospitalization patient was noted to have a leukocytosis with a white count going up as high as 20,000. It was felt patient's leukocytosis was likely secondary to steroid-induced in combination with problem #1. Urinalysis which was done was negative. Chest x-ray which was done was negative. With taper of patient's steroids her leukocytosis started to trend down such that by day of discharge her white count was down to approximately 14,000. Patient will be discharged on oral Augmentin and a steroid taper. This would need to be followed up as outpatient. #4 history of endometrial cancer  Followup with GYN as outpatient.  The rest of patient's chronic medical issues remained stable throughout the hospitalization and patient be discharged in stable and improved condition.   Procedures:  CT of the neck was done on 05/31/2012  Chest x-ray was done on 06/01/2012  Consultations:  None  Discharge Exam: Filed Vitals:   06/02/12 2013  BP: 151/86  Pulse: 55  Temp: 98.4 F (36.9 C)  Resp: 18   Filed Vitals:   06/01/12 2212 06/02/12 0626 06/02/12 1405 06/02/12 2013  BP: 140/76 145/71 132/81 151/86  Pulse: 62 52 63 55  Temp: 98.3 F (36.8 C) 98.2 F (36.8 C) 99.6 F (37.6 C) 98.4 F (36.9 C)  TempSrc: Oral Oral Oral Oral  Resp: 18 18 18 18   Height:      Weight:  79.969 kg (176 lb 4.8 oz)    SpO2: 100% 97% 98% 98%   subjective: Patient states neck swelling is improving. Patient tolerating oral diet. Patient's swallowing is improving. No complaints. Was to go home. General: Alert, awake, oriented x3, in no acute distress. HEENT: No bruits, no goiter. Decreasing submandibular swelling. Oropharynx is clear, no lesions no exudates. Heart: Regular rate and rhythm,  without murmurs, rubs, gallops. Lungs: Clear to auscultation bilaterally. Abdomen: Soft, nontender, nondistended, positive bowel sounds. Extremities: No clubbing cyanosis or edema with positive pedal pulses. Neuro: Grossly intact, nonfocal.  Discharge Instructions  Discharge Orders    Future Orders Please Complete By Expires   Diet general      Increase activity slowly      Discharge instructions      Comments:   Followup with primary care physician in one week.     Medication List  As of 06/03/2012  2:45 PM   STOP taking these medications         oxyCODONE-acetaminophen 5-325 MG per tablet         TAKE these medications         acetaminophen 500 MG tablet   Commonly known as: TYLENOL   Take 1,000 mg by mouth every 6 (six) hours as needed. For pain      amoxicillin-clavulanate 875-125 MG per tablet   Commonly known as: AUGMENTIN   Take 1 tablet by mouth every 12 (twelve) hours. Take for 8 days then stop      diphenhydrAMINE 25 MG tablet   Commonly known as: BENADRYL   Take 1 tablet (25 mg total) by mouth daily. Take 1 tablet (25mg ) 2 times daily x 2 days, then 1 tablet (25mg ) daily x 2 days then stop.      EPINEPHrine  0.3 mg/0.3 mL Devi   Commonly known as: EPI-PEN   Inject 0.3 mLs (0.3 mg total) into the muscle once. Use as needed for allergic reaction.      famotidine 20 MG tablet   Commonly known as: PEPCID   Take 1 tablet (20 mg total) by mouth 2 (two) times daily. Take 1 tablet(20mg ) 2 times daily x 2 days, then 1 tablet daily(20mg ) x 2 days, then stop.      ondansetron 8 MG disintegrating tablet   Commonly known as: ZOFRAN-ODT   Take 1 tablet (8 mg total) by mouth every 8 (eight) hours as needed for nausea.      oxyCODONE 5 MG immediate release tablet   Commonly known as: Oxy IR/ROXICODONE   Take 1 tablet (5 mg total) by mouth every 4 (four) hours as needed.      predniSONE 20 MG tablet   Commonly known as: DELTASONE   Take 1 tablet (20 mg total) by mouth  daily. Take 3 tablets (60mg ) 2 times daily x 2 days, then take 3 tablets (60mg ) daily x 2 days, then take 2 tablets (40mg ) daily x 2 days, then take 1 tablet (20mg ) daily x 2 days then stop.      promethazine 25 MG suppository   Commonly known as: PHENERGAN   Place 1 suppository (25 mg total) rectally every 6 (six) hours as needed for nausea.           Follow-up Information    Schedule an appointment as soon as possible for a visit in 1 week to follow up. (Pt to pick a PCP and follow up in 1 week)           The results of significant diagnostics from this hospitalization (including imaging, microbiology, ancillary and laboratory) are listed below for reference.    Significant Diagnostic Studies: Dg Chest 2 View  06/01/2012  *RADIOLOGY REPORT*  Clinical Data:  Leukocytosis, shortness of breath, COPD, asthma  CHEST - 2 VIEW  Comparison: None  Findings: Normal heart size, mediastinal contours, and pulmonary vascularity. EKG lead projects over right upper lobe. Lungs clear. No pleural effusion or pneumothorax. Prior right humeral head replacement. Avascular necrosis left humeral head. Retained barium within colon. No acute osseous findings.  IMPRESSION: No acute cardiopulmonary abnormalities. 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  Original Report Authenticated By: Lollie Marrow, M.D.   Ct Soft Tissue Neck W Contrast  05/31/2012  *RADIOLOGY REPORT*  Clinical Data: Acute submandibular swelling.  Question allergic reaction versus Ludwigs  angina.  CT NECK WITH CONTRAST  Technique:  Multidetector CT imaging of the neck was performed with intravenous contrast.  Contrast: OMNIPAQUE IOHEXOL 300 MG/ML  SOLN  Comparison: None.  Findings:  Diffuse inflammation of the submandibular glands. Prominent surrounding inflammation/fluid extends throughout the adjacent soft tissues including fluid extending along the parapharyngeal space, superficial to  the submandibular gland, surrounding the strap muscles reaching level of the thyroid gland and subcutaneous region. Asymmetric fluid within the floor of mouth slightly greater on the left.  Poor dentition.  Findings suggest diffuse cellulitis with allergic reaction a secondary less likely consideration.  Scattered increased number of predominately normal sized lymph nodes throughout the neck.  Slight asymmetry of the palatine tonsils with slight fullness on the left.  Mucosa abnormality not excluded.  Several small thyroid lesions largest measuring up to the 9 mm. These can be followed by the thyroid ultrasound on an elective  basis.  Biapical lung parenchymal changes suggestive of scarring without associated bony destruction.  No evidence of septic thrombophlebitis of the internal jugular vein.  Visualized orbital and intracranial structures  No retropharyngeal abscess or epidural abscess noted.  IMPRESSION: Diffuse inflammation of the submandibular glands.  Prominent surrounding inflammation/fluid extends throughout the adjacent soft tissues including fluid extending along the parapharyngeal space, superficial to the submandibular gland, surrounding the strap muscles reaching level of the thyroid gland and subcutaneous region. Asymmetric fluid within the floor of mouth slightly greater on the left.  Poor dentition.  Findings suggest diffuse cellulitis with allergic reaction a secondary less likely consideration.  Scattered increased number of predominately normal sized lymph nodes throughout the neck.  Slight asymmetry of the palatine tonsils with slight fullness on the left.  Mucosa abnormality not excluded.  Several small thyroid lesions largest measuring up to the 9 mm. These can be followed by the thyroid ultrasound on an elective basis.  Critical Value/emergent results were called by telephone at the time of interpretation on 05/31/2012 at 8:45 a.m. to Dr. Patria Mane, who verbally acknowledged these results.   Original Report Authenticated By: Fuller Canada, M.D.   Ct Abdomen Pelvis W Contrast  05/28/2012  *RADIOLOGY REPORT*  Clinical Data: Right lower quadrant and suprapubic pain for 3 days. Nausea and vomiting.  CT ABDOMEN AND PELVIS WITH CONTRAST  Technique:  Multidetector CT imaging of the abdomen and pelvis was performed following the standard protocol during bolus administration of intravenous contrast.  Contrast: OMNIPAQUE IOHEXOL 300 MG/ML  SOLN  Comparison: None.  Findings: Visualized lung bases are clear.  The liver, spleen, gallbladder, pancreas, adrenal glands, abdominal aorta, and retroperitoneal lymph nodes are unremarkable.  15 mm cyst in the lower pole of the right kidney.  Additional 5 mm cyst in the lower pole of the right kidney.  No solid mass or hydronephrosis in either kidney.  The stomach and small bowel are not distended.  Stool filled colon without distension.  No free air or free fluid in the abdomen.  Pelvis:  The uterus and adnexal structures are not enlarged.  The bladder is decompressed without apparent wall thickening.  Small amount of free fluid in the pelvis which may be physiologic.  The appendix is normal.  No evidence of diverticulitis.  Mild degenerative changes in the lumbar spine.  IMPRESSION: No acute process demonstrated in the abdomen or pelvis.  Original Report Authenticated By: Marlon Pel, M.D.    Microbiology: Recent Results (from the past 240 hour(s))  RAPID STREP SCREEN     Status: Normal   Collection Time   05/31/12  6:47 AM      Component Value Range Status Comment   Streptococcus, Group A Screen (Direct) NEGATIVE  NEGATIVE Final   CULTURE, BLOOD (ROUTINE X 2)     Status: Normal (Preliminary result)   Collection Time   06/01/12  8:00 AM      Component Value Range Status Comment   Specimen Description BLOOD RIGHT HAND   Final    Special Requests BOTTLES DRAWN AEROBIC ONLY 1CC   Final    Culture  Setup Time 06/01/2012 10:27   Final    Culture      Final    Value:        BLOOD CULTURE RECEIVED NO GROWTH TO DATE CULTURE WILL BE HELD FOR 5 DAYS BEFORE ISSUING A FINAL NEGATIVE REPORT   Report Status PENDING   Incomplete   CULTURE, BLOOD (ROUTINE X 2)  Status: Normal (Preliminary result)   Collection Time   06/01/12  8:05 AM      Component Value Range Status Comment   Specimen Description BLOOD LEFT HAND   Final    Special Requests BOTTLES DRAWN AEROBIC AND ANAEROBIC 5CC EACH   Final    Culture  Setup Time 06/01/2012 10:27   Final    Culture     Final    Value:        BLOOD CULTURE RECEIVED NO GROWTH TO DATE CULTURE WILL BE HELD FOR 5 DAYS BEFORE ISSUING A FINAL NEGATIVE REPORT   Report Status PENDING   Incomplete   URINE CULTURE     Status: Normal   Collection Time   06/01/12 10:44 AM      Component Value Range Status Comment   Specimen Description URINE, RANDOM   Final    Special Requests NONE   Final    Culture  Setup Time 06/02/2012 01:22   Final    Colony Count NO GROWTH   Final    Culture NO GROWTH   Final    Report Status 06/03/2012 FINAL   Final      Labs: Basic Metabolic Panel:  Lab 06/03/12 1610 06/02/12 0625 06/01/12 0445 05/31/12 1053 05/31/12 0500 05/28/12 2035  NA 138 139 140 -- 138 136  K 3.9 3.7 3.6 -- 3.4* 3.8  CL 102 103 106 -- 102 100  CO2 26 26 23  -- 25 25  GLUCOSE 107* 113* 158* -- 83 85  BUN 11 10 8  -- 9 7  CREATININE 0.62 0.59 0.62 -- 0.63 0.61  CALCIUM 8.9 8.7 8.9 -- 9.3 9.7  MG -- -- -- 1.7 -- --  PHOS -- -- -- -- -- --   Liver Function Tests: No results found for this basename: AST:5,ALT:5,ALKPHOS:5,BILITOT:5,PROT:5,ALBUMIN:5 in the last 168 hours No results found for this basename: LIPASE:5,AMYLASE:5 in the last 168 hours No results found for this basename: AMMONIA:5 in the last 168 hours CBC:  Lab 06/03/12 0510 06/02/12 0625 06/01/12 0445 05/31/12 0500 05/28/12 2035  WBC 14.9* 20.6* 16.1* 7.2 5.8  NEUTROABS 12.9* 18.7* -- 4.0 3.0  HGB 10.5* 9.9* 10.5* 11.7* 12.4  HCT 32.1* 30.4*  31.5* 34.6* 36.8  MCV 83.6 82.8 82.5 82.6 82.3  PLT 404* 368 343 405* 440*   Cardiac Enzymes: No results found for this basename: CKTOTAL:5,CKMB:5,CKMBINDEX:5,TROPONINI:5 in the last 168 hours BNP: BNP (last 3 results) No results found for this basename: PROBNP:3 in the last 8760 hours CBG: No results found for this basename: GLUCAP:5 in the last 168 hours  Time coordinating discharge:  Signed:  Tasheika Kitzmiller  [facility] 06/03/2012, 2:45 PM

## 2012-06-03 NOTE — Progress Notes (Signed)
ANTIBIOTIC CONSULT NOTE - FOLLOW UP  Pharmacy Consult for Unasyn Indication: cellulitis of submandibular gland  Allergies  Allergen Reactions  . Shellfish Allergy Anaphylaxis  . Tramadol Hives    Patient Measurements: Height: 5\' 4"  (162.6 cm) Weight: 176 lb 4.8 oz (79.969 kg) (floor scale) IBW/kg (Calculated) : 54.7   Vital Signs:   Intake/Output from previous day:   Intake/Output from this shift:    Labs:  Basename 06/03/12 0510 06/02/12 0625 06/01/12 0445  WBC 14.9* 20.6* 16.1*  HGB 10.5* 9.9* 10.5*  PLT 404* 368 343  LABCREA -- -- --  CREATININE 0.62 0.59 0.62   Estimated Creatinine Clearance: 94.7 ml/min (by C-G formula based on Cr of 0.62). No results found for this basename: VANCOTROUGH:2,VANCOPEAK:2,VANCORANDOM:2,GENTTROUGH:2,GENTPEAK:2,GENTRANDOM:2,TOBRATROUGH:2,TOBRAPEAK:2,TOBRARND:2,AMIKACINPEAK:2,AMIKACINTROU:2,AMIKACIN:2, in the last 72 hours   Microbiology: Recent Results (from the past 720 hour(s))  RAPID STREP SCREEN     Status: Normal   Collection Time   05/31/12  6:47 AM      Component Value Range Status Comment   Streptococcus, Group A Screen (Direct) NEGATIVE  NEGATIVE Final   CULTURE, BLOOD (ROUTINE X 2)     Status: Normal (Preliminary result)   Collection Time   06/01/12  8:00 AM      Component Value Range Status Comment   Specimen Description BLOOD RIGHT HAND   Final    Special Requests BOTTLES DRAWN AEROBIC ONLY 1CC   Final    Culture  Setup Time 06/01/2012 10:27   Final    Culture     Final    Value:        BLOOD CULTURE RECEIVED NO GROWTH TO DATE CULTURE WILL BE HELD FOR 5 DAYS BEFORE ISSUING A FINAL NEGATIVE REPORT   Report Status PENDING   Incomplete   CULTURE, BLOOD (ROUTINE X 2)     Status: Normal (Preliminary result)   Collection Time   06/01/12  8:05 AM      Component Value Range Status Comment   Specimen Description BLOOD LEFT HAND   Final    Special Requests BOTTLES DRAWN AEROBIC AND ANAEROBIC 5CC EACH   Final    Culture  Setup  Time 06/01/2012 10:27   Final    Culture     Final    Value:        BLOOD CULTURE RECEIVED NO GROWTH TO DATE CULTURE WILL BE HELD FOR 5 DAYS BEFORE ISSUING A FINAL NEGATIVE REPORT   Report Status PENDING   Incomplete     Anti-infectives     Start     Dose/Rate Route Frequency Ordered Stop   05/31/12 1600   Ampicillin-Sulbactam (UNASYN) 3 g in sodium chloride 0.9 % 100 mL IVPB        3 g 100 mL/hr over 60 Minutes Intravenous Every 6 hours 05/31/12 1228     05/31/12 0900   Ampicillin-Sulbactam (UNASYN) 3 g in sodium chloride 0.9 % 100 mL IVPB        3 g 100 mL/hr over 60 Minutes Intravenous  Once 05/31/12 0846 05/31/12 1030          Assessment: 40 YOF presents with neck swelling of the submandibular region on D4 Unasyn CT of the neck shows diffuse inflammation of the submandibular glands worrisome for cellulitis versus allergic reaction.  IV benadryl d/c, solumedrol tapering  Renal function is normal, therefore standard dose is appropriate WBC elevated but improved, patient on steroids   Goal of Therapy:  unasyn per renal function   Plan:  1.) Continue  Unasyn 3 gram q6h  2.) continue to follow  Maddyx Wieck, Loma Messing PharmD 9:21 AM 06/03/2012

## 2012-06-03 NOTE — Progress Notes (Signed)
Pt d/c instructions and medications given to pt. Pt verbalized understanding. D/ced home at this time. Pt ambulated out of facility by staff.

## 2012-06-07 LAB — CULTURE, BLOOD (ROUTINE X 2): Culture: NO GROWTH

## 2012-07-19 ENCOUNTER — Emergency Department (HOSPITAL_COMMUNITY): Payer: PRIVATE HEALTH INSURANCE

## 2012-07-19 ENCOUNTER — Encounter (HOSPITAL_COMMUNITY): Payer: Self-pay | Admitting: Emergency Medicine

## 2012-07-19 ENCOUNTER — Emergency Department (HOSPITAL_COMMUNITY)
Admission: EM | Admit: 2012-07-19 | Discharge: 2012-07-19 | Disposition: A | Discharge status: Home / Self Care | Payer: PRIVATE HEALTH INSURANCE | Attending: Emergency Medicine | Admitting: Emergency Medicine

## 2012-07-19 DIAGNOSIS — N898 Other specified noninflammatory disorders of vagina: Secondary | ICD-10-CM | POA: Insufficient documentation

## 2012-07-19 DIAGNOSIS — F17200 Nicotine dependence, unspecified, uncomplicated: Secondary | ICD-10-CM | POA: Insufficient documentation

## 2012-07-19 DIAGNOSIS — B373 Candidiasis of vulva and vagina: Secondary | ICD-10-CM

## 2012-07-19 DIAGNOSIS — R112 Nausea with vomiting, unspecified: Secondary | ICD-10-CM | POA: Insufficient documentation

## 2012-07-19 DIAGNOSIS — IMO0001 Reserved for inherently not codable concepts without codable children: Secondary | ICD-10-CM | POA: Insufficient documentation

## 2012-07-19 DIAGNOSIS — R1031 Right lower quadrant pain: Secondary | ICD-10-CM | POA: Insufficient documentation

## 2012-07-19 DIAGNOSIS — B3731 Acute candidiasis of vulva and vagina: Secondary | ICD-10-CM | POA: Insufficient documentation

## 2012-07-19 LAB — BASIC METABOLIC PANEL
GFR calc Af Amer: >90 mL/min (ref >90)
GFR calc non Af Amer: >90 mL/min (ref >90)
Potassium: 3.5 mEq/L (ref 3.5–5.1)
Sodium: 134 mEq/L — ABNORMAL LOW (ref 135–145)

## 2012-07-19 LAB — CBC WITH DIFFERENTIAL/PLATELET
Basophils Absolute: 0 10*3/uL (ref 0.0–0.1)
Basophils Relative: 0 % (ref 0–1)
Eosinophils Absolute: 0 10*3/uL (ref 0.0–0.7)
MCH: 26.8 pg (ref 26.0–34.0)
MCHC: 33.8 g/dL (ref 30.0–36.0)
Neutrophils Relative %: 89 % — ABNORMAL HIGH (ref 43–77)
Platelets: 396 10*3/uL (ref 150–400)

## 2012-07-19 LAB — URINALYSIS, ROUTINE W REFLEX MICROSCOPIC
Bilirubin Urine: NEGATIVE
Ketones, ur: NEGATIVE mg/dL
Nitrite: NEGATIVE
Specific Gravity, Urine: 1.028 (ref 1.005–1.030)
Urobilinogen, UA: 0.2 mg/dL (ref 0.0–1.0)

## 2012-07-19 LAB — WET PREP, GENITAL
Clue Cells Wet Prep HPF POC: NONE SEEN
Trich, Wet Prep: NONE SEEN

## 2012-07-19 MED ORDER — HYDROCODONE-ACETAMINOPHEN 5-325 MG PO TABS: 1.0000 | ORAL_TABLET | ORAL | Status: AC | PRN

## 2012-07-19 MED ORDER — HYDROMORPHONE HCL PF 1 MG/ML IJ SOLN
1.0000 mg | Freq: Once | INTRAMUSCULAR | Status: AC
  Administered 2012-07-19: 1 mg via INTRAVENOUS
  Filled 2012-07-19: qty 1

## 2012-07-19 MED ORDER — MORPHINE SULFATE 4 MG/ML IJ SOLN
4.0000 mg | Freq: Once | INTRAMUSCULAR | Status: AC
  Administered 2012-07-19: 4 mg via INTRAVENOUS
  Filled 2012-07-19: qty 1

## 2012-07-19 MED ORDER — SODIUM CHLORIDE 0.9 % IV BOLUS (SEPSIS)
1000.0000 mL | Freq: Once | INTRAVENOUS | Status: AC
  Administered 2012-07-19: 1000 mL via INTRAVENOUS

## 2012-07-19 MED ORDER — FLUCONAZOLE 150 MG PO TABS: 150.0000 mg | ORAL_TABLET | Freq: Once | ORAL | Status: AC

## 2012-07-19 MED ORDER — ONDANSETRON HCL 4 MG PO TABS: 4.0000 mg | ORAL_TABLET | Freq: Three times a day (TID) | ORAL | Status: AC | PRN

## 2012-07-19 MED ORDER — ONDANSETRON HCL 4 MG/2ML IJ SOLN
4.0000 mg | Freq: Once | INTRAMUSCULAR | Status: AC
  Administered 2012-07-19: 4 mg via INTRAVENOUS
  Filled 2012-07-19: qty 2

## 2012-07-19 MED ORDER — KETOROLAC TROMETHAMINE 30 MG/ML IJ SOLN
30.0000 mg | Freq: Once | INTRAMUSCULAR | Status: AC
  Administered 2012-07-19: 30 mg via INTRAVENOUS
  Filled 2012-07-19: qty 1

## 2012-07-19 NOTE — ED Notes (Signed)
Pt alert, arrives from home, c/o abd pain, nausea, seen several times with same c/o , resp even unlabored, skin pwd

## 2012-07-19 NOTE — ED Provider Notes (Signed)
Medical screening examination/treatment/procedure(s) were performed by non-physician practitioner and as supervising physician I was immediately available for consultation/collaboration.  Olivia Mackie, MD 07/19/12 2140

## 2012-07-19 NOTE — ED Provider Notes (Signed)
History     CSN: [id number]  Arrival date & time 07/19/12  0409   First MD Initiated Contact with Patient 07/19/12 [phone]      Chief Complaint  Patient presents with  . Abdominal Pain    (Consider location/radiation/quality/duration/timing/severity/associated sxs/prior treatment) HPI Comments: Patient reports sudden onset RLQ pain, cramping in nature, described is constant.  Began at 4:30pm yesterday.  Associated N/V and chills. Emesis is clear. Pt had this "exact same pain" last month and was seen in the ED twice with negative workup including labs, urine, CT abd/pelvis with contrast.  Denies urinary symptoms, abnormal vaginal discharge, any change in bowel habits.  LMP began yesterday, was normal and on time. Pt has questionable hx of endometrial cancer, states this was diagnosed at an ER by ultrasound.  She never followed up for this. No hx abdominal surgeries.  No pain between last month's visit and now.    Patient is a 41 y.o. female presenting with abdominal pain. The history is provided by the patient.  Abdominal Pain The primary symptoms of the illness include abdominal pain, nausea, vomiting and vaginal bleeding. The primary symptoms of the illness do not include diarrhea, dysuria or vaginal discharge.  Additional symptoms associated with the illness include chills. Symptoms associated with the illness do not include urgency or frequency.    Past Medical History  Diagnosis Date  . Fibromyalgia   . Anemia 05/31/2012  . Endometrial cancer     Past Surgical History  Procedure Date  . Total shoulder replacement     Family History  Problem Relation Age of Onset  . Coronary artery disease Father   . Heart attack Father   . Hypertension Other   . Diabetes Other   . Cancer Other     History  Substance Use Topics  . Smoking status: Current Everyday Smoker -- 0.5 packs/day for 10 years    Types: Cigarettes  . Smokeless tobacco: Never Used  . Alcohol Use: No    OB  History    Grav Para Term Preterm Abortions TAB SAB Ect Mult Living                  Review of Systems  Constitutional: Positive for chills.  Gastrointestinal: Positive for nausea, vomiting and abdominal pain. Negative for diarrhea.  Genitourinary: Positive for vaginal bleeding. Negative for dysuria, urgency, frequency, vaginal discharge and menstrual problem.    Allergies  Shellfish allergy and Tramadol  Home Medications   Current Outpatient Rx  Name Route Sig Dispense Refill  . ACETAMINOPHEN 500 MG PO TABS Oral Take 1,000 mg by mouth every 6 (six) hours as needed. For pain    . EPINEPHRINE 0.3 MG/0.3ML IJ DEVI Intramuscular Inject 0.3 mLs (0.3 mg total) into the muscle once. Use as needed for allergic reaction. 1 Device 0    BP 120/73  Pulse 88  Temp 98 F (36.7 C)  Resp 16  SpO2 99%  Physical Exam  Nursing note and vitals reviewed. Constitutional: She appears well-developed and well-nourished. No distress.  HENT:  Head: Normocephalic and atraumatic.  Neck: Neck supple.  Cardiovascular: Normal rate and regular rhythm.   Pulmonary/Chest: Effort normal and breath sounds normal. No respiratory distress. She has no wheezes. She has no rales.  Abdominal: Soft. She exhibits no distension. There is tenderness in the right lower quadrant. There is no rebound and no guarding.  Genitourinary: Uterus is not tender. Cervix exhibits no motion tenderness. Right adnexum displays tenderness. Right adnexum displays  no mass. Left adnexum displays no mass and no tenderness. There is bleeding around the vagina. No tenderness around the vagina. No foreign body around the vagina. No vaginal discharge found.       Pelvic performed by Alena Bills, PA-S, under my direct supervision  Neurological: She is alert.  Skin: She is not diaphoretic.    ED Course  Procedures (including critical care time)  Labs Reviewed  CBC WITH DIFFERENTIAL - Abnormal; Notable for the following:    Hemoglobin 11.3  (*)     HCT 33.4 (*)     Neutrophils Relative 89 (*)     Lymphocytes Relative 6 (*)     Lymphs Abs 0.5 (*)     All other components within normal limits  BASIC METABOLIC PANEL - Abnormal; Notable for the following:    Sodium 134 (*)     All other components within normal limits  WET PREP, GENITAL - Abnormal; Notable for the following:    Yeast Wet Prep HPF POC MODERATE (*)     WBC, Wet Prep HPF POC MANY (*)     All other components within normal limits  URINALYSIS, ROUTINE W REFLEX MICROSCOPIC  POCT PREGNANCY, URINE  GC/CHLAMYDIA PROBE AMP, GENITAL   US Transvaginal Non-ob  07/19/2012  *RADIOLOGY REPORT*  Clinical Data:  Sudden onset of right lower quadrant pain.   LMP 07/18/2012  TRANSABDOMINAL AND TRANSVAGINAL ULTRASOUND OF PELVIS DOPPLER ULTRASOUND OF OVARIES  Technique:  Both transabdominal and transvaginal ultrasound examinations of the pelvis were performed. Transabdominal technique was performed for global imaging of the pelvis including uterus, ovaries, adnexal regions, and pelvic cul-de-sac.  It was necessary to proceed with endovaginal exam following the transabdominal exam to visualize the myometrium, endometrium and adnexa.  Color and duplex Doppler ultrasound was utilized to evaluate blood flow to the ovaries.  Comparison:  None.  Findings:  Uterus:  The uterus demonstrates a sagittal length of 8.8 cm, depth of 5.1 cm and width of 6.6 cm.  Mild diffuse heterogeneity is seen with no focal mural abnormalities identified.  Endometrium:  Appears echogenic with a width of 11.3 mm.  No focal thickening is identified.  Right ovary: Measures 2.2 x 1.4 x 2.8 cm and has a normal appearance containing a few follicles.  No definite flow could be identified with either color or power Doppler.  With pulsed doppler, only a trace of venous flow was suggested in the central portion of the ovary.  Left ovary:   Measures 2.6 x 1.9 x 1.8 cm and has a normal appearance.  Both arterial and venous flow was  noted with pulsed Doppler evaluation and a paucity of flow is noted with color Doppler assessment.  Pulsed Doppler evaluation demonstrates normal low-resistance arterial and venous waveforms in the  left ovary.  Only a trace of venous flow could be obtained with pulse Doppler in the left ovary.  Other: A trace of simple free fluid is noted in the cul-de-sac.  No adnexal masses are noted  IMPRESSION: No focal mural or endometrial abnormality is identified.  Both ovaries have a normal sonographic appearance and normal flow is seen in the left ovary.  Flow could not be identified within the right ovary with either color or power Doppler and only a trace of venous flow could be identified with pulsed Doppler interrogation. This could signify the presence of very early ovarian torsion.  No ancillary features to suggest this diagnosis such as an increase in ovarian volume, appearance of ovarian  edema or associated periovarian fluid is seen.  It should be noted that the lack ovarian flow is suggested but not diagnostic of ovarian torsion and may be technical in nature.  Clinical correlation is necessary.   Original Report Authenticated By: Bertha Stakes, M.D.    US Pelvis Complete  07/19/2012  *RADIOLOGY REPORT*  Clinical Data:  Sudden onset of right lower quadrant pain.   LMP 07/18/2012  TRANSABDOMINAL AND TRANSVAGINAL ULTRASOUND OF PELVIS DOPPLER ULTRASOUND OF OVARIES  Technique:  Both transabdominal and transvaginal ultrasound examinations of the pelvis were performed. Transabdominal technique was performed for global imaging of the pelvis including uterus, ovaries, adnexal regions, and pelvic cul-de-sac.  It was necessary to proceed with endovaginal exam following the transabdominal exam to visualize the myometrium, endometrium and adnexa.  Color and duplex Doppler ultrasound was utilized to evaluate blood flow to the ovaries.  Comparison:  None.  Findings:  Uterus:  The uterus demonstrates a sagittal length of  8.8 cm, depth of 5.1 cm and width of 6.6 cm.  Mild diffuse heterogeneity is seen with no focal mural abnormalities identified.  Endometrium:  Appears echogenic with a width of 11.3 mm.  No focal thickening is identified.  Right ovary: Measures 2.2 x 1.4 x 2.8 cm and has a normal appearance containing a few follicles.  No definite flow could be identified with either color or power Doppler.  With pulsed doppler, only a trace of venous flow was suggested in the central portion of the ovary.  Left ovary:   Measures 2.6 x 1.9 x 1.8 cm and has a normal appearance.  Both arterial and venous flow was noted with pulsed Doppler evaluation and a paucity of flow is noted with color Doppler assessment.  Pulsed Doppler evaluation demonstrates normal low-resistance arterial and venous waveforms in the  left ovary.  Only a trace of venous flow could be obtained with pulse Doppler in the left ovary.  Other: A trace of simple free fluid is noted in the cul-de-sac.  No adnexal masses are noted  IMPRESSION: No focal mural or endometrial abnormality is identified.  Both ovaries have a normal sonographic appearance and normal flow is seen in the left ovary.  Flow could not be identified within the right ovary with either color or power Doppler and only a trace of venous flow could be identified with pulsed Doppler interrogation. This could signify the presence of very early ovarian torsion.  No ancillary features to suggest this diagnosis such as an increase in ovarian volume, appearance of ovarian edema or associated periovarian fluid is seen.  It should be noted that the lack ovarian flow is suggested but not diagnostic of ovarian torsion and may be technical in nature.  Clinical correlation is necessary.   Original Report Authenticated By: Bertha Stakes, M.D.    [place] Art/ven Flow Abd Pelv Doppler  07/19/2012  *RADIOLOGY REPORT*  Clinical Data:  Sudden onset of right lower quadrant pain.   LMP 07/18/2012  TRANSABDOMINAL AND  TRANSVAGINAL ULTRASOUND OF PELVIS DOPPLER ULTRASOUND OF OVARIES  Technique:  Both transabdominal and transvaginal ultrasound examinations of the pelvis were performed. Transabdominal technique was performed for global imaging of the pelvis including uterus, ovaries, adnexal regions, and pelvic cul-de-sac.  It was necessary to proceed with endovaginal exam following the transabdominal exam to visualize the myometrium, endometrium and adnexa.  Color and duplex Doppler ultrasound was utilized to evaluate blood flow to the ovaries.  Comparison:  None.  Findings:  Uterus:  The uterus  demonstrates a sagittal length of 8.8 cm, depth of 5.1 cm and width of 6.6 cm.  Mild diffuse heterogeneity is seen with no focal mural abnormalities identified.  Endometrium:  Appears echogenic with a width of 11.3 mm.  No focal thickening is identified.  Right ovary: Measures 2.2 x 1.4 x 2.8 cm and has a normal appearance containing a few follicles.  No definite flow could be identified with either color or power Doppler.  With pulsed doppler, only a trace of venous flow was suggested in the central portion of the ovary.  Left ovary:   Measures 2.6 x 1.9 x 1.8 cm and has a normal appearance.  Both arterial and venous flow was noted with pulsed Doppler evaluation and a paucity of flow is noted with color Doppler assessment.  Pulsed Doppler evaluation demonstrates normal low-resistance arterial and venous waveforms in the  left ovary.  Only a trace of venous flow could be obtained with pulse Doppler in the left ovary.  Other: A trace of simple free fluid is noted in the cul-de-sac.  No adnexal masses are noted  IMPRESSION: No focal mural or endometrial abnormality is identified.  Both ovaries have a normal sonographic appearance and normal flow is seen in the left ovary.  Flow could not be identified within the right ovary with either color or power Doppler and only a trace of venous flow could be identified with pulsed Doppler interrogation.  This could signify the presence of very early ovarian torsion.  No ancillary features to suggest this diagnosis such as an increase in ovarian volume, appearance of ovarian edema or associated periovarian fluid is seen.  It should be noted that the lack ovarian flow is suggested but not diagnostic of ovarian torsion and may be technical in nature.  Clinical correlation is necessary.   Original Report Authenticated By: Bertha Stakes, M.D.    10:48 AM Discussed patient with Dr Denton Lank.  Will call gyn for consult.    11:03 AM I spoke with Dr Normand Sloop, on call obgyn, discussed patient and all results including [place] reading.  Dr Normand Sloop doubts ovarian torsion given normal ovary.  Plan is for f/u with her in the office next week for repeat ultrasound, home with motrin and norco.  Her office will call with appointment.   1. Right lower quadrant pain   2. Candida vaginitis       MDM  Pt with acute onset crampy RLQ pain with associated chills, N/V.  Pt was falling asleep on my initial evaluation of her and appeared comfortable throughout her visit, despite complaints of pain.  Pt was seen in July with the exact same symptoms with negative workup, CT negative.  This also occurred during her period.  [place] reports question of ovarian torsion despite normal ovary vs technical difficulties in visualizing ovary completely.  I spoke with Dr Normand Sloop about the patient and all of her results and she doubts ovarian torsion, will see patient next week for repeat doppler US.  Thinks this is likely dysmenorrhea.  I believe given patient's symptoms repeating July and today, both while she was menstruating, likely support this.  Patient's symptoms treated in ED.  Moderate yeast on wet pret.  Pt with chronic anemia, Hgb 11.3 today.  No active bleeding.  UA and labs otherwise unremarkable. D/C home, Dr Redmond Baseman office will call her with follow up appointment.  Given Rx for vicodin, zofran, and diflucan.  Discussed all results  with patient.  Pt given return precautions.  Pt verbalizes understanding and agrees with plan.           [place], [place] 07/19/12 1549

## 2012-08-21 ENCOUNTER — Ambulatory Visit
Admission: RE | Admit: 2012-08-21 | Discharge: 2012-08-21 | Disposition: A | Discharge status: Home / Self Care | Payer: No Typology Code available for payment source | Source: Ambulatory Visit | Attending: Infectious Diseases | Admitting: Infectious Diseases

## 2012-08-21 ENCOUNTER — Other Ambulatory Visit: Payer: Self-pay | Admitting: Infectious Diseases

## 2012-08-21 DIAGNOSIS — R7611 Nonspecific reaction to tuberculin skin test without active tuberculosis: Secondary | ICD-10-CM

## 2012-10-22 ENCOUNTER — Emergency Department (HOSPITAL_BASED_OUTPATIENT_CLINIC_OR_DEPARTMENT_OTHER)
Admission: EM | Admit: 2012-10-22 | Discharge: 2012-10-22 | Disposition: A | Discharge status: Home / Self Care | Payer: Self-pay | Attending: Emergency Medicine | Admitting: Emergency Medicine

## 2012-10-22 ENCOUNTER — Encounter (HOSPITAL_BASED_OUTPATIENT_CLINIC_OR_DEPARTMENT_OTHER): Payer: Self-pay | Admitting: *Deleted

## 2012-10-22 DIAGNOSIS — F17200 Nicotine dependence, unspecified, uncomplicated: Secondary | ICD-10-CM | POA: Insufficient documentation

## 2012-10-22 DIAGNOSIS — IMO0001 Reserved for inherently not codable concepts without codable children: Secondary | ICD-10-CM | POA: Insufficient documentation

## 2012-10-22 DIAGNOSIS — H53149 Visual discomfort, unspecified: Secondary | ICD-10-CM | POA: Insufficient documentation

## 2012-10-22 DIAGNOSIS — G43909 Migraine, unspecified, not intractable, without status migrainosus: Secondary | ICD-10-CM | POA: Insufficient documentation

## 2012-10-22 DIAGNOSIS — R110 Nausea: Secondary | ICD-10-CM | POA: Insufficient documentation

## 2012-10-22 DIAGNOSIS — Z862 Personal history of diseases of the blood and blood-forming organs and certain disorders involving the immune mechanism: Secondary | ICD-10-CM | POA: Insufficient documentation

## 2012-10-22 HISTORY — DX: Migraine, unspecified, not intractable, without status migrainosus: G43.909

## 2012-10-22 MED ORDER — DIPHENHYDRAMINE HCL 50 MG PO TABS: 50.0000 mg | ORAL_TABLET | Freq: Every evening | ORAL | Status: DC | PRN

## 2012-10-22 MED ORDER — DIPHENHYDRAMINE HCL 50 MG/ML IJ SOLN
25.0000 mg | Freq: Once | INTRAMUSCULAR | Status: AC
  Administered 2012-10-22: 25 mg via INTRAVENOUS
  Filled 2012-10-22: qty 1

## 2012-10-22 MED ORDER — SODIUM CHLORIDE 0.9 % IV BOLUS (SEPSIS)
1000.0000 mL | Freq: Once | INTRAVENOUS | Status: AC
  Administered 2012-10-22: 1000 mL via INTRAVENOUS

## 2012-10-22 MED ORDER — METOCLOPRAMIDE HCL 10 MG PO TABS: 10.0000 mg | ORAL_TABLET | Freq: Four times a day (QID) | ORAL | Status: DC

## 2012-10-22 MED ORDER — METOCLOPRAMIDE HCL 5 MG/ML IJ SOLN
10.0000 mg | Freq: Once | INTRAMUSCULAR | Status: AC
  Administered 2012-10-22: 10 mg via INTRAVENOUS
  Filled 2012-10-22: qty 2

## 2012-10-22 MED ORDER — KETOROLAC TROMETHAMINE 30 MG/ML IJ SOLN
30.0000 mg | Freq: Once | INTRAMUSCULAR | Status: AC
  Administered 2012-10-22: 30 mg via INTRAVENOUS
  Filled 2012-10-22: qty 1

## 2012-10-22 NOTE — ED Notes (Signed)
Pt. Is in room 3 with her mother eating chips and talking with family at present time while she awaits a room.

## 2012-10-22 NOTE — ED Notes (Signed)
Headache and nausea. Hx of migraines. Pain started last pm.

## 2012-10-22 NOTE — ED Notes (Signed)
Pt. Eating ice and reports her headache is still a 91/2 over 10

## 2012-10-22 NOTE — ED Provider Notes (Signed)
History     CSN: [id number]  Arrival date & time 10/22/12  1329   First MD Initiated Contact with Patient 10/22/12 1434      Chief Complaint  Patient presents with  . Migraine    (Consider location/radiation/quality/duration/timing/severity/associated sxs/prior treatment) HPI Comments: Patient is a 41 year old female with a past medical history of chronic migraines who presents with a headache for 1 day. Patient reports a gradual onset and progressive worsening of the headache. The pain is throbbing, constant and is located in generalized head without radiation. Patient has tried tylenol for symptoms without relief. No alleviating/aggravating factors. Patient reports associated nausea and photophobia. Patient denies fever, vomiting, diarrhea, numbness/tingling, weakness, visual changes, congestion, chest pain, SOB, abdominal pain. Patient denies head trauma.     Patient is a 41 y.o. female presenting with migraines.  Migraine Associated symptoms include headaches and nausea.    Past Medical History  Diagnosis Date  . Fibromyalgia   . Anemia 05/31/2012  . Endometrial cancer   . Migraine     Past Surgical History  Procedure Date  . Total shoulder replacement     Family History  Problem Relation Age of Onset  . Coronary artery disease Father   . Heart attack Father   . Hypertension Other   . Diabetes Other   . Cancer Other     History  Substance Use Topics  . Smoking status: Current Every Day Smoker -- 0.5 packs/day for 10 years    Types: Cigarettes  . Smokeless tobacco: Never Used  . Alcohol Use: No    OB History    Grav Para Term Preterm Abortions TAB SAB Ect Mult Living                  Review of Systems  Gastrointestinal: Positive for nausea.  Neurological: Positive for headaches.  All other systems reviewed and are negative.    Allergies  Shellfish allergy and Tramadol  Home Medications   Current Outpatient Rx  Name  Route  Sig  Dispense   Refill  . ACETAMINOPHEN 500 MG PO TABS   Oral   Take 1,000 mg by mouth every 6 (six) hours as needed. For pain         . EPINEPHRINE 0.3 MG/0.3ML IJ DEVI   Intramuscular   Inject 0.3 mLs (0.3 mg total) into the muscle once. Use as needed for allergic reaction.   1 Device   0     BP 120/79  Pulse 79  Temp 98.9 F (37.2 C) (Oral)  Resp 20  SpO2 100%  Physical Exam  Nursing note and vitals reviewed. Constitutional: She is oriented to person, place, and time. She appears well-developed and well-nourished. No distress.  HENT:  Head: Normocephalic and atraumatic.  Eyes: Conjunctivae normal are normal.  Neck: Normal range of motion. Neck supple.       No meningeal signs.   Cardiovascular: Normal rate and regular rhythm.  Exam reveals no gallop and no friction rub.   No murmur heard. Pulmonary/Chest: Effort normal and breath sounds normal. She has no wheezes. She has no rales. She exhibits no tenderness.  Abdominal: Soft. She exhibits no distension. There is no tenderness. There is no rebound and no guarding.  Musculoskeletal: Normal range of motion.  Neurological: She is alert and oriented to person, place, and time. No cranial nerve deficit. Coordination normal.       Strength and sensation equal and intact bilaterally. Speech is goal-oriented. Moves limbs without  ataxia.   Skin: Skin is warm and dry. She is not diaphoretic.  Psychiatric: She has a normal mood and affect. Her behavior is normal.    ED Course  Procedures (including critical care time)  Labs Reviewed - No data to display No results found.   1. Migraine       MDM  2:42 PM Patient reports having a typical severe migraine. Patient will have fluids, reglan, toradol, and benadryl.   4:02 PM Patient denies relief from interventions. Patient will have more reglan and benadryl.   5:12 PM Patient feeling better. I will discharge her with benadryl and reglan and instructions to follow up with a neurologist.  No meningeal signs. Patient is afebrile without focal neurologic deficits. No further evaluation needed at this time.     Emilia Beck, PA-C 10/23/12 0008

## 2012-10-24 NOTE — ED Provider Notes (Signed)
Medical screening examination/treatment/procedure(s) were performed by non-physician practitioner and as supervising physician I was immediately available for consultation/collaboration.    Clarise Chacko L Jamilah Jean, MD 10/24/12 0732 

## 2012-11-13 ENCOUNTER — Emergency Department (HOSPITAL_BASED_OUTPATIENT_CLINIC_OR_DEPARTMENT_OTHER)
Admission: EM | Admit: 2012-11-13 | Discharge: 2012-11-13 | Disposition: A | Discharge status: Home / Self Care | Payer: Self-pay | Attending: Emergency Medicine | Admitting: Emergency Medicine

## 2012-11-13 ENCOUNTER — Encounter (HOSPITAL_BASED_OUTPATIENT_CLINIC_OR_DEPARTMENT_OTHER): Payer: Self-pay | Admitting: *Deleted

## 2012-11-13 DIAGNOSIS — IMO0001 Reserved for inherently not codable concepts without codable children: Secondary | ICD-10-CM | POA: Insufficient documentation

## 2012-11-13 DIAGNOSIS — L03317 Cellulitis of buttock: Secondary | ICD-10-CM | POA: Insufficient documentation

## 2012-11-13 DIAGNOSIS — Z8589 Personal history of malignant neoplasm of other organs and systems: Secondary | ICD-10-CM | POA: Insufficient documentation

## 2012-11-13 DIAGNOSIS — Z79899 Other long term (current) drug therapy: Secondary | ICD-10-CM | POA: Insufficient documentation

## 2012-11-13 DIAGNOSIS — L0231 Cutaneous abscess of buttock: Secondary | ICD-10-CM | POA: Insufficient documentation

## 2012-11-13 DIAGNOSIS — F17200 Nicotine dependence, unspecified, uncomplicated: Secondary | ICD-10-CM | POA: Insufficient documentation

## 2012-11-13 DIAGNOSIS — Z862 Personal history of diseases of the blood and blood-forming organs and certain disorders involving the immune mechanism: Secondary | ICD-10-CM | POA: Insufficient documentation

## 2012-11-13 DIAGNOSIS — G43909 Migraine, unspecified, not intractable, without status migrainosus: Secondary | ICD-10-CM | POA: Insufficient documentation

## 2012-11-13 MED ORDER — HYDROCODONE-ACETAMINOPHEN 5-325 MG PO TABS: 2.0000 | ORAL_TABLET | ORAL | Status: DC | PRN

## 2012-11-13 MED ORDER — LORAZEPAM 1 MG PO TABS
0.5000 mg | ORAL_TABLET | Freq: Once | ORAL | Status: AC
  Administered 2012-11-13: 0.5 mg via ORAL
  Filled 2012-11-13: qty 1

## 2012-11-13 MED ORDER — SULFAMETHOXAZOLE-TRIMETHOPRIM 800-160 MG PO TABS: 1.0000 | ORAL_TABLET | Freq: Two times a day (BID) | ORAL | Status: DC

## 2012-11-13 MED ORDER — OXYCODONE-ACETAMINOPHEN 5-325 MG PO TABS
2.0000 | ORAL_TABLET | Freq: Once | ORAL | Status: AC
  Administered 2012-11-13: 2 via ORAL
  Filled 2012-11-13 (×2): qty 2

## 2012-11-13 MED ORDER — LIDOCAINE HCL 2 % IJ SOLN
20.0000 mL | Freq: Once | INTRAMUSCULAR | Status: AC
  Administered 2012-11-13: 400 mg

## 2012-11-13 MED ORDER — LIDOCAINE HCL 2 % IJ SOLN
INTRAMUSCULAR | Status: AC
  Administered 2012-11-13: 400 mg
  Filled 2012-11-13: qty 20

## 2012-11-13 MED ORDER — SODIUM CHLORIDE 0.9 % IV SOLN: Freq: Once | INTRAVENOUS | Status: DC

## 2012-11-13 MED ORDER — ONDANSETRON HCL 4 MG/2ML IJ SOLN: 4.0000 mg | Freq: Once | INTRAMUSCULAR | Status: DC

## 2012-11-13 MED ORDER — HYDROMORPHONE HCL PF 1 MG/ML IJ SOLN: 1.0000 mg | Freq: Once | INTRAMUSCULAR | Status: DC

## 2012-11-13 NOTE — ED Provider Notes (Signed)
Medical screening examination/treatment/procedure(s) were performed by non-physician practitioner and as supervising physician I was immediately available for consultation/collaboration.   Rolan Bucco, MD 11/13/12 2325

## 2012-11-13 NOTE — ED Notes (Signed)
Pt reports abcess on her right buttock and labia she notice yesterday PM  Reports Nausea and chills

## 2012-11-13 NOTE — ED Provider Notes (Signed)
History     CSN: [id number]  Arrival date & time 11/13/12  1452   First MD Initiated Contact with Patient 11/13/12 1640      Chief Complaint  Patient presents with  . Abscess    (Consider location/radiation/quality/duration/timing/severity/associated sxs/prior treatment) Patient is a 41 y.o. female presenting with abscess. The history is provided by the patient. No language interpreter was used.  Abscess  This is a new problem. The current episode started today. The onset was gradual. The problem occurs continuously. The problem has been gradually worsening. The abscess is present on the right buttock. The problem is moderate. The abscess is characterized by redness, painfulness and swelling. The abscess first occurred at home. There were no sick contacts.  Pt complains of an abscess to her right buttock.  Pt reports swelling and pain  Past Medical History  Diagnosis Date  . Fibromyalgia   . Anemia 05/31/2012  . Endometrial cancer   . Migraine     Past Surgical History  Procedure Date  . Total shoulder replacement     Family History  Problem Relation Age of Onset  . Coronary artery disease Father   . Heart attack Father   . Hypertension Other   . Diabetes Other   . Cancer Other     History  Substance Use Topics  . Smoking status: Current Every Day Smoker -- 0.5 packs/day for 10 years    Types: Cigarettes  . Smokeless tobacco: Never Used  . Alcohol Use: No    OB History    Grav Para Term Preterm Abortions TAB SAB Ect Mult Living                  Review of Systems  Skin: Positive for wound.  All other systems reviewed and are negative.    Allergies  Shellfish allergy and Tramadol  Home Medications   Current Outpatient Rx  Name  Route  Sig  Dispense  Refill  . ACETAMINOPHEN 500 MG PO TABS   Oral   Take 1,000 mg by mouth every 6 (six) hours as needed. For pain         . DIPHENHYDRAMINE HCL 50 MG PO TABS   Oral   Take 1 tablet (50 mg total) by  mouth at bedtime as needed for itching.   30 tablet   0   . EPINEPHRINE 0.3 MG/0.3ML IJ DEVI   Intramuscular   Inject 0.3 mLs (0.3 mg total) into the muscle once. Use as needed for allergic reaction.   1 Device   0   . METOCLOPRAMIDE HCL 10 MG PO TABS   Oral   Take 1 tablet (10 mg total) by mouth every 6 (six) hours.   30 tablet   0     BP 108/71  Pulse 90  Temp 98.2 F (36.8 C) (Oral)  Resp 18  SpO2 100%  LMP 10/28/2012  Physical Exam  Nursing note and vitals reviewed. Constitutional: She is oriented to person, place, and time. She appears well-developed and well-nourished.  HENT:  Head: Normocephalic.  Cardiovascular: Normal rate.   Pulmonary/Chest: Effort normal.  Musculoskeletal: She exhibits tenderness.       4cm area of erythema and swelling right buttock.    Neurological: She is alert and oriented to person, place, and time. She has normal reflexes.  Skin: There is erythema.  Psychiatric: She has a normal mood and affect.    ED Course  INCISION AND DRAINAGE Date/Time: 11/13/2012 7:37 PM Performed by:  Mahira Petras K Authorized by: Elson Areas Consent: Verbal consent not obtained. Risks and benefits: risks, benefits and alternatives were discussed Consent given by: patient Patient identity confirmed: verbally with patient Time out: Immediately prior to procedure a "time out" was called to verify the correct patient, procedure, equipment, support staff and site/side marked as required. Type: abscess Body area: anogenital (buttock) Anesthesia: local infiltration Local anesthetic: lidocaine 2% without epinephrine Scalpel size: 11 Incision type: single straight Complexity: simple Drainage amount: moderate Wound treatment: wound left open Packing material: 1/2 in iodoform gauze Patient tolerance: Patient tolerated the procedure well with no immediate complications.   (including critical care time)  Labs Reviewed - No data to display No results  found.   No diagnosis found.    MDM  Hydrocodone and bactrim        Autumn Gordon [place], [place] 11/13/12 1940

## 2012-11-16 ENCOUNTER — Emergency Department (HOSPITAL_BASED_OUTPATIENT_CLINIC_OR_DEPARTMENT_OTHER)
Admission: EM | Admit: 2012-11-16 | Discharge: 2012-11-16 | Disposition: A | Discharge status: Home / Self Care | Payer: Self-pay | Attending: Emergency Medicine | Admitting: Emergency Medicine

## 2012-11-16 ENCOUNTER — Encounter (HOSPITAL_BASED_OUTPATIENT_CLINIC_OR_DEPARTMENT_OTHER): Payer: Self-pay | Admitting: *Deleted

## 2012-11-16 DIAGNOSIS — Z8739 Personal history of other diseases of the musculoskeletal system and connective tissue: Secondary | ICD-10-CM | POA: Insufficient documentation

## 2012-11-16 DIAGNOSIS — Z79899 Other long term (current) drug therapy: Secondary | ICD-10-CM | POA: Insufficient documentation

## 2012-11-16 DIAGNOSIS — Z862 Personal history of diseases of the blood and blood-forming organs and certain disorders involving the immune mechanism: Secondary | ICD-10-CM | POA: Insufficient documentation

## 2012-11-16 DIAGNOSIS — Z8679 Personal history of other diseases of the circulatory system: Secondary | ICD-10-CM | POA: Insufficient documentation

## 2012-11-16 DIAGNOSIS — L03317 Cellulitis of buttock: Secondary | ICD-10-CM | POA: Insufficient documentation

## 2012-11-16 DIAGNOSIS — L0231 Cutaneous abscess of buttock: Secondary | ICD-10-CM | POA: Insufficient documentation

## 2012-11-16 DIAGNOSIS — F17200 Nicotine dependence, unspecified, uncomplicated: Secondary | ICD-10-CM | POA: Insufficient documentation

## 2012-11-16 DIAGNOSIS — Z8542 Personal history of malignant neoplasm of other parts of uterus: Secondary | ICD-10-CM | POA: Insufficient documentation

## 2012-11-16 MED ORDER — OXYCODONE-ACETAMINOPHEN 5-325 MG PO TABS: 2.0000 | ORAL_TABLET | ORAL | Status: DC | PRN

## 2012-11-16 NOTE — ED Provider Notes (Signed)
History     CSN: [id number]  Arrival date & time 11/16/12  1330   First MD Initiated Contact with Patient 11/16/12 1348      Chief Complaint  Patient presents with  . Wound Check    (Consider location/radiation/quality/duration/timing/severity/associated sxs/prior treatment) HPI Comments: Patient seen here three days ago for buttock abscess.  Was I and Ded, packed and is here for follow up .  The area remains swollen and tender.  The packing has since fallen out.  Patient is a 42 y.o. female presenting with wound check. The history is provided by the patient.  Wound Check  Treated in ED: 3 days ago. Previous treatment in the ED includes I&D of abscess. Treatments since wound repair include oral antibiotics. The redness has improved. The swelling has not changed. The pain has not changed.    Past Medical History  Diagnosis Date  . Fibromyalgia   . Anemia 05/31/2012  . Endometrial cancer   . Migraine     Past Surgical History  Procedure Date  . Total shoulder replacement     Family History  Problem Relation Age of Onset  . Coronary artery disease Father   . Heart attack Father   . Hypertension Other   . Diabetes Other   . Cancer Other     History  Substance Use Topics  . Smoking status: Current Every Day Smoker -- 0.5 packs/day for 10 years    Types: Cigarettes  . Smokeless tobacco: Never Used  . Alcohol Use: No    OB History    Grav Para Term Preterm Abortions TAB SAB Ect Mult Living                  Review of Systems  All other systems reviewed and are negative.    Allergies  Shellfish allergy and Tramadol  Home Medications   Current Outpatient Rx  Name  Route  Sig  Dispense  Refill  . ACETAMINOPHEN 500 MG PO TABS   Oral   Take 1,000 mg by mouth every 6 (six) hours as needed. For pain         . DIPHENHYDRAMINE HCL 50 MG PO TABS   Oral   Take 1 tablet (50 mg total) by mouth at bedtime as needed for itching.   30 tablet   0   . EPINEPHRINE  0.3 MG/0.3ML IJ DEVI   Intramuscular   Inject 0.3 mLs (0.3 mg total) into the muscle once. Use as needed for allergic reaction.   1 Device   0   . HYDROCODONE-ACETAMINOPHEN 5-325 MG PO TABS   Oral   Take 2 tablets by mouth every 4 (four) hours as needed for pain.   10 tablet   0   . METOCLOPRAMIDE HCL 10 MG PO TABS   Oral   Take 1 tablet (10 mg total) by mouth every 6 (six) hours.   30 tablet   0   . SULFAMETHOXAZOLE-TRIMETHOPRIM 800-160 MG PO TABS   Oral   Take 1 tablet by mouth every 12 (twelve) hours.   14 tablet   0     BP 98/62  Pulse 88  Temp 97.9 F (36.6 C) (Oral)  Resp 18  SpO2 100%  LMP 10/28/2012  Physical Exam  Nursing note and vitals reviewed. Constitutional: She is oriented to person, place, and time. She appears well-developed and well-nourished. No distress.  HENT:  Head: Normocephalic and atraumatic.  Mouth/Throat: Oropharynx is clear and moist.  Neck: Normal range  of motion. Neck supple.  Neurological: She is alert and oriented to person, place, and time.  Skin: She is not diaphoretic.       There is an area on the right buttock that is swollen and tender.  There is no erythema to the over lying skin.      ED Course  Procedures (including critical care time)  Labs Reviewed - No data to display No results found.   No diagnosis found.    MDM  Appears to be healing well.  The packing has since fallen out.  There continues to be a firm area under the skin.  I am unsure if this is fluctuance or an area of granulation.  I will recommend continuing antibiotics, performing soaks, return in two days if not improving or worsening.          Geoffery Lyons, MD 11/16/12 1409

## 2012-11-16 NOTE — ED Notes (Signed)
Here to have packing removed from right buttocks. I&D to abscess 3 days ago.

## 2013-01-16 ENCOUNTER — Encounter (HOSPITAL_BASED_OUTPATIENT_CLINIC_OR_DEPARTMENT_OTHER): Payer: Self-pay

## 2013-01-16 ENCOUNTER — Emergency Department (HOSPITAL_BASED_OUTPATIENT_CLINIC_OR_DEPARTMENT_OTHER): Payer: Self-pay

## 2013-01-16 ENCOUNTER — Emergency Department (HOSPITAL_BASED_OUTPATIENT_CLINIC_OR_DEPARTMENT_OTHER)
Admission: EM | Admit: 2013-01-16 | Discharge: 2013-01-16 | Disposition: A | Discharge status: Home / Self Care | Payer: Self-pay | Attending: Emergency Medicine | Admitting: Emergency Medicine

## 2013-01-16 DIAGNOSIS — Z8542 Personal history of malignant neoplasm of other parts of uterus: Secondary | ICD-10-CM | POA: Insufficient documentation

## 2013-01-16 DIAGNOSIS — Z8679 Personal history of other diseases of the circulatory system: Secondary | ICD-10-CM | POA: Insufficient documentation

## 2013-01-16 DIAGNOSIS — F17200 Nicotine dependence, unspecified, uncomplicated: Secondary | ICD-10-CM | POA: Insufficient documentation

## 2013-01-16 DIAGNOSIS — N946 Dysmenorrhea, unspecified: Secondary | ICD-10-CM | POA: Insufficient documentation

## 2013-01-16 DIAGNOSIS — Z3202 Encounter for pregnancy test, result negative: Secondary | ICD-10-CM | POA: Insufficient documentation

## 2013-01-16 DIAGNOSIS — Z862 Personal history of diseases of the blood and blood-forming organs and certain disorders involving the immune mechanism: Secondary | ICD-10-CM | POA: Insufficient documentation

## 2013-01-16 LAB — URINE MICROSCOPIC-ADD ON

## 2013-01-16 LAB — URINALYSIS, ROUTINE W REFLEX MICROSCOPIC
Bilirubin Urine: NEGATIVE
Glucose, UA: NEGATIVE mg/dL
Specific Gravity, Urine: 1.024 (ref 1.005–1.030)
pH: 5.5 (ref 5.0–8.0)

## 2013-01-16 MED ORDER — HYDROCODONE-ACETAMINOPHEN 5-325 MG PO TABS: 2.0000 | ORAL_TABLET | ORAL | Status: DC | PRN

## 2013-01-16 MED ORDER — KETOROLAC TROMETHAMINE 60 MG/2ML IM SOLN
60.0000 mg | Freq: Once | INTRAMUSCULAR | Status: AC
  Administered 2013-01-16: 60 mg via INTRAMUSCULAR
  Filled 2013-01-16: qty 2

## 2013-01-16 MED ORDER — ONDANSETRON HCL 4 MG/2ML IJ SOLN
4.0000 mg | Freq: Once | INTRAMUSCULAR | Status: AC
  Administered 2013-01-16: 4 mg via INTRAMUSCULAR
  Filled 2013-01-16: qty 2

## 2013-01-16 MED ORDER — HYDROMORPHONE HCL PF 2 MG/ML IJ SOLN
2.0000 mg | Freq: Once | INTRAMUSCULAR | Status: AC
  Administered 2013-01-16: 2 mg via INTRAMUSCULAR
  Filled 2013-01-16: qty 1

## 2013-01-16 NOTE — ED Notes (Signed)
Pt reports severe abdominal cramps that started at 0130 unrelieved after taking Aleve.

## 2013-01-16 NOTE — ED Notes (Signed)
D/c home- directed to pharmacy to pick up prescription- pt called family for ride

## 2013-01-16 NOTE — ED Provider Notes (Signed)
History     CSN: [id number]  Arrival date & time 01/16/13  1206   First MD Initiated Contact with Patient 01/16/13 1227      Chief Complaint  Patient presents with  . Abdominal Cramping    (Consider location/radiation/quality/duration/timing/severity/associated sxs/prior treatment) Patient is a 42 y.o. female presenting with cramps.  Abdominal Cramping This is a new problem. The current episode started today. The problem occurs constantly. Associated symptoms include abdominal pain. Nothing aggravates the symptoms. She has tried nothing for the symptoms. The treatment provided moderate relief.    Past Medical History  Diagnosis Date  . Fibromyalgia   . Anemia 05/31/2012  . Endometrial cancer   . Migraine     Past Surgical History  Procedure Laterality Date  . Total shoulder replacement      Family History  Problem Relation Age of Onset  . Coronary artery disease Father   . Heart attack Father   . Hypertension Other   . Diabetes Other   . Cancer Other     History  Substance Use Topics  . Smoking status: Current Some Day Smoker -- 0.50 packs/day for 10 years    Types: Cigarettes  . Smokeless tobacco: Never Used  . Alcohol Use: No    OB History   Grav Para Term Preterm Abortions TAB SAB Ect Mult Living                  Review of Systems  Gastrointestinal: Positive for abdominal pain.  All other systems reviewed and are negative.    Allergies  Shellfish allergy and Tramadol  Home Medications  No current outpatient prescriptions on file.  BP 139/93  Pulse 82  Temp(Src) 97.8 F (36.6 C) (Oral)  Resp 16  Ht 5\' 3"  (1.6 m)  Wt 176 lb (79.833 kg)  BMI 31.18 kg/m2  SpO2 100%  LMP 01/16/2013  Physical Exam  Nursing note and vitals reviewed. Constitutional: She appears well-developed and well-nourished.  HENT:  Head: Normocephalic and atraumatic.  Eyes: Conjunctivae are normal. Pupils are equal, round, and reactive to light.  Neck: Normal range of  motion. Neck supple.  Cardiovascular: Normal rate, regular rhythm and normal heart sounds.   Pulmonary/Chest: Effort normal and breath sounds normal.  Abdominal: Soft. There is tenderness.  Musculoskeletal: Normal range of motion.  Neurological: She is alert.  Skin: Skin is warm.  Psychiatric: She has a normal mood and affect.    ED Course  Procedures (including critical care time)  Labs Reviewed  URINALYSIS, ROUTINE W REFLEX MICROSCOPIC - Abnormal; Notable for the following:    Hgb urine dipstick SMALL (*)    All other components within normal limits  URINE MICROSCOPIC-ADD ON - Abnormal; Notable for the following:    Bacteria, UA FEW (*)    All other components within normal limits  PREGNANCY, URINE   US Transvaginal Non-ob  01/16/2013  *RADIOLOGY REPORT*  Clinical Data: Painful menses  TRANSABDOMINAL AND TRANSVAGINAL ULTRASOUND OF PELVIS Technique:  Both transabdominal and transvaginal ultrasound examinations of the pelvis were performed. Transabdominal technique was performed for global imaging of the pelvis including uterus, ovaries, adnexal regions, and pelvic cul-de-sac. Transabdominal images limited by inadequate bladder distention and suboptimal acoustic window  It was necessary to proceed with endovaginal exam following the transabdominal exam to visualize the uterus, endometrium, ovaries and adnexae.  Comparison:  07/19/2012  Findings:  Uterus: 9.7 cm length by 5.1 cm AP by 5.3 cm transverse.  Normal morphology without mass.  Tiny 4  mm calcification seen posteriorly at upper to mid uterus.  Endometrium: 13 mm thick, minimally prominent, 11 mm previously. No endometrial fluid.  Right ovary:  2.4 x 1.1 x 2.1 cm.  Normal morphology without mass.  Left ovary: Not identified on either transabdominal or endovaginal imaging question related to size, position or obscuration by bowel  Other findings: Trace free pelvic fluid.  No adnexal masses.  IMPRESSION: Nonvisualization left ovary.  Otherwise negative exam.   Original Report Authenticated By: Ulyses Southward, M.D.    US Pelvis Complete  01/16/2013  *RADIOLOGY REPORT*  Clinical Data: Painful menses  TRANSABDOMINAL AND TRANSVAGINAL ULTRASOUND OF PELVIS Technique:  Both transabdominal and transvaginal ultrasound examinations of the pelvis were performed. Transabdominal technique was performed for global imaging of the pelvis including uterus, ovaries, adnexal regions, and pelvic cul-de-sac. Transabdominal images limited by inadequate bladder distention and suboptimal acoustic window  It was necessary to proceed with endovaginal exam following the transabdominal exam to visualize the uterus, endometrium, ovaries and adnexae.  Comparison:  07/19/2012  Findings:  Uterus: 9.7 cm length by 5.1 cm AP by 5.3 cm transverse.  Normal morphology without mass.  Tiny 4 mm calcification seen posteriorly at upper to mid uterus.  Endometrium: 13 mm thick, minimally prominent, 11 mm previously. No endometrial fluid.  Right ovary:  2.4 x 1.1 x 2.1 cm.  Normal morphology without mass.  Left ovary: Not identified on either transabdominal or endovaginal imaging question related to size, position or obscuration by bowel  Other findings: Trace free pelvic fluid.  No adnexal masses.  IMPRESSION: Nonvisualization left ovary. Otherwise negative exam.   Original Report Authenticated By: Ulyses Southward, M.D.      No diagnosis found.    MDM   Pt given IM pain medication,  Pt feels better.  Pt reports she had an abnormal papsmear 1 year ago.  Pt has not had follow up.   Pt advised to call gyn clinic for complete evaluation.   I will treat menstraul cramps       Lonia Skinner [place], PA-C 01/16/13 1627  Lonia Skinner [place], [place] 01/16/13 [phone]

## 2013-01-17 NOTE — ED Provider Notes (Signed)
Medical screening examination/treatment/procedure(s) were performed by non-physician practitioner and as supervising physician I was immediately available for consultation/collaboration.   David Yelverton, MD 01/17/13 1503 

## 2013-02-10 ENCOUNTER — Emergency Department (HOSPITAL_BASED_OUTPATIENT_CLINIC_OR_DEPARTMENT_OTHER)
Admission: EM | Admit: 2013-02-10 | Discharge: 2013-02-10 | Disposition: A | Discharge status: Home / Self Care | Payer: Self-pay | Attending: Emergency Medicine | Admitting: Emergency Medicine

## 2013-02-10 ENCOUNTER — Encounter (HOSPITAL_BASED_OUTPATIENT_CLINIC_OR_DEPARTMENT_OTHER): Payer: Self-pay | Admitting: *Deleted

## 2013-02-10 DIAGNOSIS — Z862 Personal history of diseases of the blood and blood-forming organs and certain disorders involving the immune mechanism: Secondary | ICD-10-CM | POA: Insufficient documentation

## 2013-02-10 DIAGNOSIS — Z8739 Personal history of other diseases of the musculoskeletal system and connective tissue: Secondary | ICD-10-CM | POA: Insufficient documentation

## 2013-02-10 DIAGNOSIS — K047 Periapical abscess without sinus: Secondary | ICD-10-CM | POA: Insufficient documentation

## 2013-02-10 DIAGNOSIS — Z8589 Personal history of malignant neoplasm of other organs and systems: Secondary | ICD-10-CM | POA: Insufficient documentation

## 2013-02-10 DIAGNOSIS — N946 Dysmenorrhea, unspecified: Secondary | ICD-10-CM | POA: Insufficient documentation

## 2013-02-10 DIAGNOSIS — R51 Headache: Secondary | ICD-10-CM | POA: Insufficient documentation

## 2013-02-10 DIAGNOSIS — Z8679 Personal history of other diseases of the circulatory system: Secondary | ICD-10-CM | POA: Insufficient documentation

## 2013-02-10 DIAGNOSIS — F17200 Nicotine dependence, unspecified, uncomplicated: Secondary | ICD-10-CM | POA: Insufficient documentation

## 2013-02-10 MED ORDER — OXYCODONE-ACETAMINOPHEN 5-325 MG PO TABS: 1.0000 | ORAL_TABLET | ORAL | Status: DC | PRN

## 2013-02-10 MED ORDER — OXYCODONE-ACETAMINOPHEN 5-325 MG PO TABS
2.0000 | ORAL_TABLET | Freq: Once | ORAL | Status: AC
  Administered 2013-02-10: 2 via ORAL
  Filled 2013-02-10 (×2): qty 2

## 2013-02-10 MED ORDER — PENICILLIN V POTASSIUM 500 MG PO TABS: 500.0000 mg | ORAL_TABLET | Freq: Four times a day (QID) | ORAL | Status: AC

## 2013-02-10 NOTE — ED Provider Notes (Signed)
History     CSN: [id number]  Arrival date & time 02/10/13  0728   First MD Initiated Contact with Patient 02/10/13 0740      Chief Complaint  Patient presents with  . Dental Pain    Patient is a 42 y.o. female presenting with tooth pain. The history is provided by the patient.  Dental PainThe primary symptoms include mouth pain. Primary symptoms do not include fever. The symptoms began yesterday. The symptoms are worsening. The symptoms are recurrent. The symptoms occur constantly.  Additional symptoms include: facial swelling.  pt here for dental pain and swelling She also reports "menstrual cramps" that she gets monthly and is similar to prior episodes of cramping No fever/vomiting.    Past Medical History  Diagnosis Date  . Fibromyalgia   . Anemia 05/31/2012  . Endometrial cancer   . Migraine     Past Surgical History  Procedure Laterality Date  . Total shoulder replacement      Family History  Problem Relation Age of Onset  . Coronary artery disease Father   . Heart attack Father   . Hypertension Other   . Diabetes Other   . Cancer Other     History  Substance Use Topics  . Smoking status: Current Some Day Smoker -- 0.50 packs/day for 10 years    Types: Cigarettes  . Smokeless tobacco: Never Used  . Alcohol Use: No    OB History   Grav Para Term Preterm Abortions TAB SAB Ect Mult Living                  Review of Systems  Constitutional: Negative for fever.  HENT: Positive for facial swelling.   Gastrointestinal: Negative for vomiting.  Genitourinary: Negative for vaginal bleeding and vaginal discharge.    Allergies  Shellfish allergy and Tramadol  Home Medications   Current Outpatient Rx  Name  Route  Sig  Dispense  Refill  . oxyCODONE-acetaminophen (PERCOCET/ROXICET) 5-325 MG per tablet   Oral   Take 1 tablet by mouth every 4 (four) hours as needed for pain.   5 tablet   0   . penicillin v potassium (VEETID) 500 MG tablet   Oral  Take 1 tablet (500 mg total) by mouth 4 (four) times daily.   40 tablet   0     BP 124/81  Pulse 100  Temp(Src) 98.4 F (36.9 C) (Oral)  Resp 18  SpO2 99%  LMP 01/16/2013  Physical Exam CONSTITUTIONAL: Well developed/well nourished HEAD AND FACE: Normocephalic/atraumatic EYES: EOMI/PERRL ENMT: Mucous membranes moist.  Poor dentition.  No trismus.  Swelling noted to gingiva surrounding right lower premolar but no fluctuant abscess noted.   NECK: supple no meningeal signs CV: S1/S2 noted, no murmurs/rubs/gallops noted LUNGS: Lungs are clear to auscultation bilaterally, no apparent distress ABDOMEN: soft, nontender, no rebound or guarding NEURO: Pt is awake/alert, moves all extremitiesx4 EXTREMITIES:full ROM SKIN: warm, color normal  ED Course  Procedures (including critical care time)    1. Dental abscess     Medications  oxyCODONE-acetaminophen (PERCOCET/ROXICET) 5-325 MG per tablet 2 tablet (2 tablets Oral Given 02/10/13 0754)     Area of swelling not amenable to drainage at this time Will start antibiotics and refer to dental For her menstrual cramps, abdomen soft, she reports similar to prior and she has been seen previously this month for similar episode  MDM  Nursing notes including past medical history and social history reviewed and considered in documentation Previous  records reviewed and considered - recent ED visit reviewed          Joya Gaskins, MD 02/10/13 [phone]

## 2013-02-10 NOTE — ED Notes (Signed)
Patient c/o R lower jaw pain from broken tooth & pre-menstrual cramping, took ibuprofen, no relief

## 2013-02-15 ENCOUNTER — Emergency Department (HOSPITAL_BASED_OUTPATIENT_CLINIC_OR_DEPARTMENT_OTHER)
Admission: EM | Admit: 2013-02-15 | Discharge: 2013-02-15 | Disposition: A | Discharge status: Home / Self Care | Payer: PRIVATE HEALTH INSURANCE | Attending: Emergency Medicine | Admitting: Emergency Medicine

## 2013-02-15 ENCOUNTER — Encounter (HOSPITAL_BASED_OUTPATIENT_CLINIC_OR_DEPARTMENT_OTHER): Payer: Self-pay | Admitting: Emergency Medicine

## 2013-02-15 DIAGNOSIS — Z8542 Personal history of malignant neoplasm of other parts of uterus: Secondary | ICD-10-CM | POA: Insufficient documentation

## 2013-02-15 DIAGNOSIS — J449 Chronic obstructive pulmonary disease, unspecified: Secondary | ICD-10-CM | POA: Insufficient documentation

## 2013-02-15 DIAGNOSIS — Z8739 Personal history of other diseases of the musculoskeletal system and connective tissue: Secondary | ICD-10-CM | POA: Insufficient documentation

## 2013-02-15 DIAGNOSIS — D649 Anemia, unspecified: Secondary | ICD-10-CM | POA: Insufficient documentation

## 2013-02-15 DIAGNOSIS — J4489 Other specified chronic obstructive pulmonary disease: Secondary | ICD-10-CM | POA: Insufficient documentation

## 2013-02-15 DIAGNOSIS — N920 Excessive and frequent menstruation with regular cycle: Secondary | ICD-10-CM | POA: Insufficient documentation

## 2013-02-15 DIAGNOSIS — F17200 Nicotine dependence, unspecified, uncomplicated: Secondary | ICD-10-CM | POA: Insufficient documentation

## 2013-02-15 DIAGNOSIS — R109 Unspecified abdominal pain: Secondary | ICD-10-CM | POA: Insufficient documentation

## 2013-02-15 DIAGNOSIS — Z8679 Personal history of other diseases of the circulatory system: Secondary | ICD-10-CM | POA: Insufficient documentation

## 2013-02-15 DIAGNOSIS — R300 Dysuria: Secondary | ICD-10-CM | POA: Insufficient documentation

## 2013-02-15 HISTORY — DX: Chronic obstructive pulmonary disease, unspecified: J44.9

## 2013-02-15 LAB — CBC
MCV: 72.6 fL — ABNORMAL LOW (ref 78.0–100.0)
Platelets: 488 10*3/uL — ABNORMAL HIGH (ref 150–400)
RDW: 16 % — ABNORMAL HIGH (ref 11.5–15.5)
WBC: 5.6 10*3/uL (ref 4.0–10.5)

## 2013-02-15 MED ORDER — OXYCODONE-ACETAMINOPHEN 5-325 MG PO TABS
1.0000 | ORAL_TABLET | Freq: Once | ORAL | Status: AC
  Administered 2013-02-15: 1 via ORAL
  Filled 2013-02-15 (×2): qty 1

## 2013-02-15 MED ORDER — KETOROLAC TROMETHAMINE 30 MG/ML IJ SOLN
60.0000 mg | Freq: Once | INTRAMUSCULAR | Status: AC
  Administered 2013-02-15: 60 mg via INTRAMUSCULAR
  Filled 2013-02-15: qty 2

## 2013-02-15 MED ORDER — OXYCODONE-ACETAMINOPHEN 5-325 MG PO TABS: 2.0000 | ORAL_TABLET | ORAL | Status: DC | PRN

## 2013-02-15 NOTE — ED Provider Notes (Signed)
History     CSN: [id number]  Arrival date & time 02/15/13  1659   First MD Initiated Contact with Patient 02/15/13 1711      Chief Complaint  Patient presents with  . Menorrhagia  . Abdominal Pain    (Consider location/radiation/quality/duration/timing/severity/associated sxs/prior treatment) HPI Comments: Pt states that he is having a pad per hour of bleeding for the last couple of days and she is having a large amount of cramping:pt states that she hasn't seem gyn because she doesn't have insurance:pt states that she took oxycodone with relief  Patient is a 42 y.o. female presenting with abdominal pain. The history is provided by the patient. No language interpreter was used.  Abdominal Pain Pain location: lower abdominal pain. Pain quality: aching   Pain radiates to:  Does not radiate Pain severity:  Moderate Onset quality:  Gradual Timing:  Constant Relieved by: narcotics. Associated symptoms: dysuria   Associated symptoms: no fever and no nausea     Past Medical History  Diagnosis Date  . Fibromyalgia   . Anemia 05/31/2012  . Endometrial cancer   . Migraine   . COPD (chronic obstructive pulmonary disease)   . Sleep apnea     Past Surgical History  Procedure Laterality Date  . Total shoulder replacement      Family History  Problem Relation Age of Onset  . Coronary artery disease Father   . Heart attack Father   . Hypertension Other   . Diabetes Other   . Cancer Other     History  Substance Use Topics  . Smoking status: Current Some Day Smoker -- 0.50 packs/day for 10 years    Types: Cigarettes  . Smokeless tobacco: Never Used  . Alcohol Use: No    OB History   Grav Para Term Preterm Abortions TAB SAB Ect Mult Living                  Review of Systems  Constitutional: Negative for fever.  Cardiovascular: Negative.   Gastrointestinal: Positive for abdominal pain. Negative for nausea.  Genitourinary: Positive for dysuria.    Allergies   Shellfish allergy and Tramadol  Home Medications   Current Outpatient Rx  Name  Route  Sig  Dispense  Refill  . oxyCODONE-acetaminophen (PERCOCET/ROXICET) 5-325 MG per tablet   Oral   Take 1 tablet by mouth every 4 (four) hours as needed for pain.   5 tablet   0   . oxyCODONE-acetaminophen (PERCOCET/ROXICET) 5-325 MG per tablet   Oral   Take 2 tablets by mouth every 4 (four) hours as needed for pain.   6 tablet   0   . penicillin v potassium (VEETID) 500 MG tablet   Oral   Take 1 tablet (500 mg total) by mouth 4 (four) times daily.   40 tablet   0     BP 144/90  Pulse 88  Temp(Src) 99.1 F (37.3 C) (Oral)  Resp 18  Ht 5\' 3"  (1.6 m)  Wt 172 lb (78.019 kg)  BMI 30.48 kg/m2  SpO2 97%  LMP 01/16/2013  Physical Exam  Nursing note and vitals reviewed. Constitutional: She appears well-developed and well-nourished.  Cardiovascular: Normal rate and regular rhythm.   Pulmonary/Chest: Effort normal and breath sounds normal.  Abdominal: Bowel sounds are normal.  Musculoskeletal: Normal range of motion.  Neurological: She is alert.  Skin: Skin is warm and dry.    ED Course  Procedures (including critical care time)  Labs Reviewed  CBC - Abnormal; Notable for the following:    Hemoglobin 10.2 (*)    HCT 31.0 (*)    MCV 72.6 (*)    MCH 23.9 (*)    RDW 16.0 (*)    Platelets 488 (*)    All other components within normal limits   No results found.   1. Menorrhagia   2. Anemia       MDM  Pt anemia consistent with previous levels:pt is comfortable at this time:pt states that oxycodone from the other day helped:pt had ultrasound in the last month with similar symptoms that was normal:don't feel further imaging is needed at this time:will treat pain       Teressa Lower, NP 02/15/13 1857

## 2013-02-15 NOTE — ED Provider Notes (Signed)
Medical screening examination/treatment/procedure(s) were performed by non-physician practitioner and as supervising physician I was immediately available for consultation/collaboration.   Orphia Mctigue B. Ervine Witucki, MD 02/15/13 2144 

## 2013-02-15 NOTE — ED Notes (Signed)
C/o heavy periods and abd pain since 02/09/13.  States she has saturated a tampon every hour since 3/29 and has been passing very large clots.  Has taken IBU without relief.

## 2013-03-05 ENCOUNTER — Emergency Department (HOSPITAL_BASED_OUTPATIENT_CLINIC_OR_DEPARTMENT_OTHER)
Admission: EM | Admit: 2013-03-05 | Discharge: 2013-03-05 | Disposition: A | Discharge status: Home / Self Care | Payer: PRIVATE HEALTH INSURANCE | Attending: Emergency Medicine | Admitting: Emergency Medicine

## 2013-03-05 ENCOUNTER — Encounter (HOSPITAL_BASED_OUTPATIENT_CLINIC_OR_DEPARTMENT_OTHER): Payer: Self-pay

## 2013-03-05 ENCOUNTER — Emergency Department (HOSPITAL_BASED_OUTPATIENT_CLINIC_OR_DEPARTMENT_OTHER): Payer: PRIVATE HEALTH INSURANCE

## 2013-03-05 DIAGNOSIS — J449 Chronic obstructive pulmonary disease, unspecified: Secondary | ICD-10-CM | POA: Insufficient documentation

## 2013-03-05 DIAGNOSIS — K5900 Constipation, unspecified: Secondary | ICD-10-CM | POA: Insufficient documentation

## 2013-03-05 DIAGNOSIS — Z8679 Personal history of other diseases of the circulatory system: Secondary | ICD-10-CM | POA: Insufficient documentation

## 2013-03-05 DIAGNOSIS — R319 Hematuria, unspecified: Secondary | ICD-10-CM | POA: Insufficient documentation

## 2013-03-05 DIAGNOSIS — R112 Nausea with vomiting, unspecified: Secondary | ICD-10-CM | POA: Insufficient documentation

## 2013-03-05 DIAGNOSIS — Z8739 Personal history of other diseases of the musculoskeletal system and connective tissue: Secondary | ICD-10-CM | POA: Insufficient documentation

## 2013-03-05 DIAGNOSIS — Z8542 Personal history of malignant neoplasm of other parts of uterus: Secondary | ICD-10-CM | POA: Insufficient documentation

## 2013-03-05 DIAGNOSIS — R509 Fever, unspecified: Secondary | ICD-10-CM | POA: Insufficient documentation

## 2013-03-05 DIAGNOSIS — R109 Unspecified abdominal pain: Secondary | ICD-10-CM

## 2013-03-05 DIAGNOSIS — Z862 Personal history of diseases of the blood and blood-forming organs and certain disorders involving the immune mechanism: Secondary | ICD-10-CM | POA: Insufficient documentation

## 2013-03-05 DIAGNOSIS — Z3202 Encounter for pregnancy test, result negative: Secondary | ICD-10-CM | POA: Insufficient documentation

## 2013-03-05 DIAGNOSIS — J4489 Other specified chronic obstructive pulmonary disease: Secondary | ICD-10-CM | POA: Insufficient documentation

## 2013-03-05 DIAGNOSIS — F17200 Nicotine dependence, unspecified, uncomplicated: Secondary | ICD-10-CM | POA: Insufficient documentation

## 2013-03-05 HISTORY — DX: Unspecified asthma, uncomplicated: J45.909

## 2013-03-05 LAB — CBC WITH DIFFERENTIAL/PLATELET
Basophils Absolute: 0 10*3/uL (ref 0.0–0.1)
Eosinophils Absolute: 0.1 10*3/uL (ref 0.0–0.7)
Eosinophils Relative: 2 % (ref 0–5)
Lymphocytes Relative: 26 % (ref 12–46)
MCV: 71.4 fL — ABNORMAL LOW (ref 78.0–100.0)
Platelets: 414 10*3/uL — ABNORMAL HIGH (ref 150–400)
RDW: 15.7 % — ABNORMAL HIGH (ref 11.5–15.5)
WBC: 6.6 10*3/uL (ref 4.0–10.5)

## 2013-03-05 LAB — RAPID URINE DRUG SCREEN, HOSP PERFORMED
Amphetamines: NOT DETECTED
Barbiturates: NOT DETECTED
Benzodiazepines: NOT DETECTED
Cocaine: NOT DETECTED
Tetrahydrocannabinol: NOT DETECTED

## 2013-03-05 LAB — COMPREHENSIVE METABOLIC PANEL
ALT: 12 U/L (ref 0–35)
AST: 20 U/L (ref 0–37)
CO2: 22 mEq/L (ref 19–32)
Calcium: 9.3 mg/dL (ref 8.4–10.5)
Sodium: 138 mEq/L (ref 135–145)
Total Protein: 7.3 g/dL (ref 6.0–8.3)

## 2013-03-05 LAB — URINALYSIS, ROUTINE W REFLEX MICROSCOPIC
Bilirubin Urine: NEGATIVE
Nitrite: NEGATIVE
Specific Gravity, Urine: 1.021 (ref 1.005–1.030)
Urobilinogen, UA: 0.2 mg/dL (ref 0.0–1.0)

## 2013-03-05 MED ORDER — IOHEXOL 300 MG/ML  SOLN
100.0000 mL | Freq: Once | INTRAMUSCULAR | Status: AC | PRN
  Administered 2013-03-05: 100 mL via INTRAVENOUS

## 2013-03-05 MED ORDER — ONDANSETRON HCL 4 MG/2ML IJ SOLN
4.0000 mg | Freq: Once | INTRAMUSCULAR | Status: AC
  Administered 2013-03-05: 4 mg via INTRAVENOUS
  Filled 2013-03-05: qty 2

## 2013-03-05 MED ORDER — OXYCODONE-ACETAMINOPHEN 5-325 MG PO TABS
1.0000 | ORAL_TABLET | Freq: Once | ORAL | Status: AC
  Administered 2013-03-05: 1 via ORAL
  Filled 2013-03-05 (×2): qty 1

## 2013-03-05 MED ORDER — IOHEXOL 300 MG/ML  SOLN
50.0000 mL | Freq: Once | INTRAMUSCULAR | Status: AC | PRN
  Administered 2013-03-05: 50 mL via ORAL

## 2013-03-05 NOTE — ED Provider Notes (Signed)
History     CSN: [id number]  Arrival date & time 03/05/13  1603   First MD Initiated Contact with Patient 03/05/13 1611      Chief Complaint  Patient presents with  . Abdominal Pain    (Consider location/radiation/quality/duration/timing/severity/associated sxs/prior treatment) Patient is a 42 y.o. female presenting with abdominal pain.  Abdominal Pain Associated symptoms: fever, hematuria, nausea and vomiting   Associated symptoms: no chest pain and no shortness of breath     Patient presents this afternoon for abdominal pain that woke her from sleep last night at 2 am.  She says she thought her appendix must have bust it hurt so bad.  She has had severe pain and nausea/vomiting ever since.  No diarrhea, in fact she reports chronic constipation, no BM in 3-4 days.  She had subjective fevers but did not take her temperature. The pain is 10/10 and in the RLQ.  She says this is worse than the last several times she came to the ER with abdominal pain.   Past Medical History  Diagnosis Date  . Fibromyalgia   . Anemia 05/31/2012  . Endometrial cancer   . Migraine   . COPD (chronic obstructive pulmonary disease)   . Sleep apnea     Past Surgical History  Procedure Laterality Date  . Total shoulder replacement      Family History  Problem Relation Age of Onset  . Coronary artery disease Father   . Heart attack Father   . Hypertension Other   . Diabetes Other   . Cancer Other     History  Substance Use Topics  . Smoking status: Current Some Day Smoker -- 0.50 packs/day for 10 years    Types: Cigarettes  . Smokeless tobacco: Never Used  . Alcohol Use: No    Review of Systems  Constitutional: Positive for fever.  HENT: Negative for neck pain.   Respiratory: Negative for shortness of breath.   Cardiovascular: Negative for chest pain.  Gastrointestinal: Positive for nausea, vomiting and abdominal pain.  Genitourinary: Positive for hematuria.  Skin: Negative for rash.     Allergies  Shellfish allergy and Tramadol  Home Medications   Current Outpatient Rx  Name  Route  Sig  Dispense  Refill  . oxyCODONE-acetaminophen (PERCOCET/ROXICET) 5-325 MG per tablet   Oral   Take 1 tablet by mouth every 4 (four) hours as needed for pain.   5 tablet   0   . oxyCODONE-acetaminophen (PERCOCET/ROXICET) 5-325 MG per tablet   Oral   Take 2 tablets by mouth every 4 (four) hours as needed for pain.   6 tablet   0     BP 120/77  Pulse 89  Temp(Src) 98.8 F (37.1 C) (Oral)  Resp 20  SpO2 97%  LMP 03/04/2013  Physical Exam  Constitutional: She is oriented to person, place, and time. She appears well-developed and well-nourished. No distress.  HENT:  Head: Normocephalic.  Mouth/Throat: Oropharynx is clear and moist.  Eyes: EOM are normal. Pupils are equal, round, and reactive to light.  Neck: Normal range of motion. Neck supple.  Cardiovascular: Normal rate, regular rhythm and normal heart sounds.   Pulmonary/Chest: Effort normal and breath sounds normal. No respiratory distress.  Abdominal: Soft. Bowel sounds are normal. She exhibits no mass. There is tenderness. There is no rebound and no guarding.  Pain diffuse but most severe RLQ  Musculoskeletal: She exhibits no edema.  Neurological: She is alert and oriented to person, place, and time.  Skin: No rash noted.    ED Course  Procedures (including critical care time)  Labs Reviewed  URINALYSIS, ROUTINE W REFLEX MICROSCOPIC - Abnormal; Notable for the following:    Ketones, ur 15 (*)    Leukocytes, UA SMALL (*)    All other components within normal limits  CBC WITH DIFFERENTIAL - Abnormal; Notable for the following:    Hemoglobin 9.8 (*)    HCT 29.5 (*)    MCV 71.4 (*)    MCH 23.7 (*)    RDW 15.7 (*)    Platelets 414 (*)    All other components within normal limits  COMPREHENSIVE METABOLIC PANEL - Abnormal; Notable for the following:    Potassium 3.4 (*)    Glucose, Bld 121 (*)    Total  Bilirubin 0.1 (*)    All other components within normal limits  PREGNANCY, URINE  LIPASE, BLOOD  URINE RAPID DRUG SCREEN (HOSP PERFORMED)  URINE MICROSCOPIC-ADD ON   Ct Abdomen Pelvis W Contrast  03/05/2013  *RADIOLOGY REPORT*  Clinical Data: Abdominal pain  CT ABDOMEN AND PELVIS WITH CONTRAST  Technique:  Multidetector CT imaging of the abdomen and pelvis was performed following the standard protocol during bolus administration of intravenous contrast.  Contrast: 50mL OMNIPAQUE IOHEXOL 300 MG/ML  SOLN, OMNIPAQUE IOHEXOL 300 MG/ML  SOLN  Comparison: 05/28/2012  Findings: The lung bases appear clear.  No pericardial or pleural effusion.  No focal liver abnormalities identified.  The gallbladder is within normal limits.  No biliary dilatation.  Normal appearance of the pancreas.  The spleen is normal.  The adrenal glands are both unremarkable.  Low attenuation structure in the inferior pole of right kidney likely represents a cyst measuring 1.6 cm.  The left kidney is unremarkable.  Urinary bladder is normal.  Uterus and the adnexal structures are unremarkable.  No enlarged upper abdominal lymph nodes.  There is no pelvic or inguinal adenopathy.   Right external iliac lymph node measures 7 mm, image 67/series 2.  This is unchanged from previous exam.  The stomach is unremarkable.  The small bowel loops have a normal course and caliber without evidence for obstruction.  Normal appearance of the appendix.  There is no free fluid or fluid collections identified within the abdomen or pelvis.  Normal appearance of the colon.  Review of the visualized bony structures is unremarkable.  IMPRESSION:  1.  No acute findings identified within the abdomen or pelvis.   Original Report Authenticated By: Signa Kell, M.D.      1. Constipation   2. Abdominal pain       MDM  Discussed imaging and lab results, possibility of constipation +/- menstrual pain. Also discussed possibility of STD, patient refuses  pelvic exam to check for STD's.  Discharge home, recommend establishing care with primary care provider.         Ardyth Gal, MD 03/05/13 1836

## 2013-03-05 NOTE — ED Notes (Signed)
Pt states that she had onset of severe abdominal pain in the RLQ radiating to the back.  Pt states that she had onset of vaginal bleeding, vomiting shortly after.  Pt states that she has vomited about 7 times in the last 12 hours.  Pt denies urinary s/sx.

## 2013-03-05 NOTE — ED Provider Notes (Addendum)
This chart was scribed for Autumn Octave, MD by Bennett Scrape, ED Scribe. This patient was seen in room MH05/MH05 and the patient's care was started at .  Autumn Gordon is a 42 y.o. female who presents to the Emergency Department complaining of Constant since 9:30 AM this morning with associated nausea and emesis. She reports prior less severe episodes of abdominal pain. She also reports hematuria. She denies vaginal bleeding. She denies prior cholecystectomy and appendectomy.  [place] of ovaries and uterus last month was normal.  Physical Exam  Triage Vitals: BP 120/77  Pulse 89  Temp(Src) 98.8 F (37.1 C) (Oral)  Resp 20  SpO2 97%  LMP 02/15/2013  Physical Exam  Nursing note and vitals reviewed. Cardiovascular: Normal rate and regular rhythm.   No murmur heard. Abdominal: There is tenderness (suprapubic  tenderness). There is no rebound and no guarding.  Right CVA tenderness    ED Course  Procedures  COORDINATION OF CARE: 4:58 PM-Discussed treatment plan ith pt at bedside and pt agreed to plan.   Lower abdominal pain with nausea and vomiting.  Hematuria with CVAT.  No peritoneal signs. Patient refuses pelvic exam.  I personally performed the services described in this documentation, which was scribed in my presence. The recorded information has been reviewed and is accurate.     Autumn Octave, MD 03/06/13 1610  Autumn Octave, MD 03/06/13 [id number]

## 2013-03-06 NOTE — ED Provider Notes (Signed)
I saw and evaluated the patient, reviewed the resident's note and I agree with the findings and plan. If applicable, I agree with the resident's interpretation of the EKG.  If applicable, I was present for critical portions of any procedures performed.   See my additional note  Glynn Octave, MD 03/06/13 [phone]

## 2013-05-19 ENCOUNTER — Emergency Department (HOSPITAL_COMMUNITY)
Admission: EM | Admit: 2013-05-19 | Discharge: 2013-05-19 | Disposition: A | Discharge status: Home / Self Care | Payer: Self-pay | Attending: Emergency Medicine | Admitting: Emergency Medicine

## 2013-05-19 ENCOUNTER — Encounter (HOSPITAL_COMMUNITY): Payer: Self-pay | Admitting: *Deleted

## 2013-05-19 ENCOUNTER — Emergency Department (HOSPITAL_COMMUNITY): Payer: Self-pay

## 2013-05-19 DIAGNOSIS — J4489 Other specified chronic obstructive pulmonary disease: Secondary | ICD-10-CM | POA: Insufficient documentation

## 2013-05-19 DIAGNOSIS — N946 Dysmenorrhea, unspecified: Secondary | ICD-10-CM | POA: Insufficient documentation

## 2013-05-19 DIAGNOSIS — Z8542 Personal history of malignant neoplasm of other parts of uterus: Secondary | ICD-10-CM | POA: Insufficient documentation

## 2013-05-19 DIAGNOSIS — Z8679 Personal history of other diseases of the circulatory system: Secondary | ICD-10-CM | POA: Insufficient documentation

## 2013-05-19 DIAGNOSIS — R112 Nausea with vomiting, unspecified: Secondary | ICD-10-CM | POA: Insufficient documentation

## 2013-05-19 DIAGNOSIS — Z3202 Encounter for pregnancy test, result negative: Secondary | ICD-10-CM | POA: Insufficient documentation

## 2013-05-19 DIAGNOSIS — F17200 Nicotine dependence, unspecified, uncomplicated: Secondary | ICD-10-CM | POA: Insufficient documentation

## 2013-05-19 DIAGNOSIS — Z862 Personal history of diseases of the blood and blood-forming organs and certain disorders involving the immune mechanism: Secondary | ICD-10-CM | POA: Insufficient documentation

## 2013-05-19 DIAGNOSIS — Z8669 Personal history of other diseases of the nervous system and sense organs: Secondary | ICD-10-CM | POA: Insufficient documentation

## 2013-05-19 DIAGNOSIS — J449 Chronic obstructive pulmonary disease, unspecified: Secondary | ICD-10-CM | POA: Insufficient documentation

## 2013-05-19 LAB — URINALYSIS, ROUTINE W REFLEX MICROSCOPIC
Bilirubin Urine: NEGATIVE
Leukocytes, UA: NEGATIVE
Nitrite: NEGATIVE
Specific Gravity, Urine: 1.024 (ref 1.005–1.030)
Urobilinogen, UA: 0.2 mg/dL (ref 0.0–1.0)
pH: 6 (ref 5.0–8.0)

## 2013-05-19 LAB — BASIC METABOLIC PANEL
Chloride: 102 mEq/L (ref 96–112)
GFR calc Af Amer: >90 mL/min (ref >90)
GFR calc non Af Amer: >90 mL/min (ref >90)
Potassium: 3.5 mEq/L (ref 3.5–5.1)
Sodium: 135 mEq/L (ref 135–145)

## 2013-05-19 LAB — CBC WITH DIFFERENTIAL/PLATELET
Basophils Relative: 1 % (ref 0–1)
Eosinophils Relative: 3 % (ref 0–5)
Hemoglobin: 9.4 g/dL — ABNORMAL LOW (ref 12.0–15.0)
MCH: 22.4 pg — ABNORMAL LOW (ref 26.0–34.0)
Monocytes Relative: 7 % (ref 3–12)
Neutrophils Relative %: 41 % — ABNORMAL LOW (ref 43–77)
Platelets: 417 10*3/uL — ABNORMAL HIGH (ref 150–400)
RBC: 4.19 MIL/uL (ref 3.87–5.11)
WBC: 5.4 10*3/uL (ref 4.0–10.5)

## 2013-05-19 MED ORDER — HYDROMORPHONE HCL PF 1 MG/ML IJ SOLN
1.0000 mg | Freq: Once | INTRAMUSCULAR | Status: AC
  Administered 2013-05-19: 1 mg via INTRAVENOUS
  Filled 2013-05-19: qty 1

## 2013-05-19 MED ORDER — SODIUM CHLORIDE 0.9 % IV SOLN
INTRAVENOUS | Status: DC
  Administered 2013-05-19: 02:00:00 via INTRAVENOUS

## 2013-05-19 MED ORDER — IOHEXOL 300 MG/ML  SOLN
50.0000 mL | Freq: Once | INTRAMUSCULAR | Status: AC | PRN
  Administered 2013-05-19: 50 mL via ORAL

## 2013-05-19 MED ORDER — HYDROCODONE-ACETAMINOPHEN 5-325 MG PO TABS: 2.0000 | ORAL_TABLET | ORAL | Status: DC | PRN

## 2013-05-19 MED ORDER — MORPHINE SULFATE 4 MG/ML IJ SOLN
4.0000 mg | Freq: Once | INTRAMUSCULAR | Status: AC
  Administered 2013-05-19: 4 mg via INTRAVENOUS
  Filled 2013-05-19: qty 1

## 2013-05-19 MED ORDER — ONDANSETRON HCL 4 MG/2ML IJ SOLN
4.0000 mg | Freq: Once | INTRAMUSCULAR | Status: AC
  Administered 2013-05-19: 4 mg via INTRAVENOUS
  Filled 2013-05-19: qty 2

## 2013-05-19 MED ORDER — IOHEXOL 300 MG/ML  SOLN
100.0000 mL | Freq: Once | INTRAMUSCULAR | Status: AC | PRN
  Administered 2013-05-19: 100 mL via INTRAVENOUS

## 2013-05-19 NOTE — ED Notes (Signed)
Pt reports severe menstrual cramping, n/v that began today - pt denies diarrhea or any known fever. Pt denies heavy vaginal bleeding.

## 2013-05-19 NOTE — ED Notes (Signed)
Notified Pt that urine is needed.   

## 2013-05-19 NOTE — ED Provider Notes (Signed)
History    CSN: [id number] Arrival date & time 05/19/13  0111  First MD Initiated Contact with Patient 05/19/13 0125     Chief Complaint  Patient presents with  . Abdominal Cramping  . Nausea  . Emesis   (Consider location/radiation/quality/duration/timing/severity/associated sxs/prior Treatment) HPI Comments: Patient presents to the ER for evaluation of lower abdominal and pelvic pain and cramping. Patient reports it feels like severe menstrual cramps. She is not, however, having any heavy vaginal bleeding currently, although she is having her period. Patient reports nausea and vomiting associated with the pain. Pain radiates into her on the right side. She has not had any urinary symptoms.  Patient is a 42 y.o. female presenting with cramps and vomiting.  Abdominal Cramping Associated symptoms include abdominal pain.  Emesis Associated symptoms: abdominal pain    Past Medical History  Diagnosis Date  . Fibromyalgia   . Anemia 05/31/2012  . Endometrial cancer   . Migraine   . COPD (chronic obstructive pulmonary disease)   . Sleep apnea   . Asthma    Past Surgical History  Procedure Laterality Date  . Total shoulder replacement     Family History  Problem Relation Age of Onset  . Coronary artery disease Father   . Heart attack Father   . Hypertension Other   . Diabetes Other   . Cancer Other    History  Substance Use Topics  . Smoking status: Current Some Day Smoker -- 0.50 packs/day for 10 years    Types: Cigarettes  . Smokeless tobacco: Never Used  . Alcohol Use: No   OB History   Grav Para Term Preterm Abortions TAB SAB Ect Mult Living                 Review of Systems  Gastrointestinal: Positive for nausea, vomiting and abdominal pain.  Genitourinary: Negative for dysuria, frequency and hematuria.  All other systems reviewed and are negative.    Allergies  Shellfish allergy and Tramadol  Home Medications   Current Outpatient Rx  Name  Route   Sig  Dispense  Refill  . ibuprofen (ADVIL,MOTRIN) 200 MG tablet   Oral   Take 600 mg by mouth every 6 (six) hours as needed for pain.          BP 134/87  Pulse 84  Temp(Src) 98.4 F (36.9 C) (Oral)  Resp 20  SpO2 95%  LMP 05/19/2013 Physical Exam  Constitutional: She is oriented to person, place, and time. She appears well-developed and well-nourished. No distress.  HENT:  Head: Normocephalic and atraumatic.  Right Ear: Hearing normal.  Left Ear: Hearing normal.  Nose: Nose normal.  Mouth/Throat: Oropharynx is clear and moist and mucous membranes are normal.  Eyes: Conjunctivae and EOM are normal. Pupils are equal, round, and reactive to light.  Neck: Normal range of motion. Neck supple.  Cardiovascular: Regular rhythm, S1 normal and S2 normal.  Exam reveals no gallop and no friction rub.   No murmur heard. Pulmonary/Chest: Effort normal and breath sounds normal. No respiratory distress. She exhibits no tenderness.  Abdominal: Soft. Normal appearance and bowel sounds are normal. There is no hepatosplenomegaly. There is tenderness in the right lower quadrant and suprapubic area. There is no rebound, no guarding, no tenderness at McBurney's point and negative Murphy's sign. No hernia.  Musculoskeletal: Normal range of motion.  Neurological: She is alert and oriented to person, place, and time. She has normal strength. No cranial nerve deficit or sensory deficit.  Coordination normal. GCS eye subscore is 4. GCS verbal subscore is 5. GCS motor subscore is 6.  Skin: Skin is warm, dry and intact. No rash noted. No cyanosis.  Psychiatric: She has a normal mood and affect. Her speech is normal and behavior is normal. Thought content normal.    ED Course  Procedures (including critical care time) Labs Reviewed  CBC WITH DIFFERENTIAL - Abnormal; Notable for the following:    Hemoglobin 9.4 (*)    HCT 29.2 (*)    MCV 69.7 (*)    MCH 22.4 (*)    RDW 17.3 (*)    Platelets 417 (*)     Neutrophils Relative % 41 (*)    Lymphocytes Relative 48 (*)    All other components within normal limits  BASIC METABOLIC PANEL  URINALYSIS, ROUTINE W REFLEX MICROSCOPIC  POCT PREGNANCY, URINE   Ct Abdomen Pelvis W Contrast  05/19/2013   *RADIOLOGY REPORT*  Clinical Data: Right lower quadrant abdominal pain.  Menstrual cramps with nausea and vomiting today.  CT ABDOMEN AND PELVIS WITH CONTRAST  Technique:  Multidetector CT imaging of the abdomen and pelvis was performed following the standard protocol during bolus administration of intravenous contrast.  Contrast: 50mL OMNIPAQUE IOHEXOL 300 MG/ML  SOLN, OMNIPAQUE IOHEXOL 300 MG/ML  SOLN  Comparison: 03/05/2013  Findings: The lung bases are clear.  The liver, spleen, gallbladder, pancreas, adrenal glands, abdominal aorta, inferior vena cava, and retroperitoneal lymph nodes are unremarkable.  Cyst in the lower pole of the right kidney is stable since previous study.  No evidence of solid mass or hydronephrosis in either kidney.  The stomach, small bowel, and colon are unremarkable.  No free air or free fluid in the abdomen.  Abdominal wall musculature appears intact.  Pelvis:  Tampon in the vagina.  Uterus and ovaries are not enlarged.  No free or loculated pelvic fluid collections.  Bladder wall is not thickened.  Appendix is normal.  Stool fills the rectosigmoid colon without evidence of diverticulitis.  No significant pelvic lymphadenopathy.  1.9 x 3.1 cm cystic lesion to the right of midline in the perineum may represent a bartholin gland or skene duct cyst.  This is stable since the previous study. Normal alignment of the lumbar vertebrae.  IMPRESSION: No acute process demonstrated in the abdomen or pelvis.  Stable appearance since previous study.   Original Report Authenticated By: Burman Nieves, M.D.   Diagnosis: Dysmenorrhea  MDM  Patient presents to the ER for evaluation of abdominal and pelvic pain. Patient reports he menstrual cramps  but no excessive bleeding. Patient's pain is on the right side in the lower abdomen and pelvic region. No guarding or rebound. Blood work was unremarkable. CT scan performed to rule out appendicitis, no acute abnormality seen. Patient treated with analgesia here in the ER. Followup with OB/GYN.  Gilda Crease, MD 05/19/13 [phone]

## 2013-08-04 ENCOUNTER — Emergency Department (HOSPITAL_COMMUNITY)
Admission: EM | Admit: 2013-08-04 | Discharge: 2013-08-04 | Disposition: A | Discharge status: Home / Self Care | Payer: PRIVATE HEALTH INSURANCE | Attending: Emergency Medicine | Admitting: Emergency Medicine

## 2013-08-04 ENCOUNTER — Encounter (HOSPITAL_COMMUNITY): Payer: Self-pay | Admitting: *Deleted

## 2013-08-04 DIAGNOSIS — J4489 Other specified chronic obstructive pulmonary disease: Secondary | ICD-10-CM | POA: Insufficient documentation

## 2013-08-04 DIAGNOSIS — F17200 Nicotine dependence, unspecified, uncomplicated: Secondary | ICD-10-CM | POA: Insufficient documentation

## 2013-08-04 DIAGNOSIS — R110 Nausea: Secondary | ICD-10-CM | POA: Insufficient documentation

## 2013-08-04 DIAGNOSIS — J45909 Unspecified asthma, uncomplicated: Secondary | ICD-10-CM | POA: Insufficient documentation

## 2013-08-04 DIAGNOSIS — N946 Dysmenorrhea, unspecified: Secondary | ICD-10-CM

## 2013-08-04 DIAGNOSIS — J449 Chronic obstructive pulmonary disease, unspecified: Secondary | ICD-10-CM | POA: Insufficient documentation

## 2013-08-04 DIAGNOSIS — Z3202 Encounter for pregnancy test, result negative: Secondary | ICD-10-CM | POA: Insufficient documentation

## 2013-08-04 DIAGNOSIS — Z79899 Other long term (current) drug therapy: Secondary | ICD-10-CM | POA: Insufficient documentation

## 2013-08-04 DIAGNOSIS — Z862 Personal history of diseases of the blood and blood-forming organs and certain disorders involving the immune mechanism: Secondary | ICD-10-CM | POA: Insufficient documentation

## 2013-08-04 DIAGNOSIS — N949 Unspecified condition associated with female genital organs and menstrual cycle: Secondary | ICD-10-CM | POA: Insufficient documentation

## 2013-08-04 DIAGNOSIS — Z8739 Personal history of other diseases of the musculoskeletal system and connective tissue: Secondary | ICD-10-CM | POA: Insufficient documentation

## 2013-08-04 DIAGNOSIS — Z8542 Personal history of malignant neoplasm of other parts of uterus: Secondary | ICD-10-CM | POA: Insufficient documentation

## 2013-08-04 DIAGNOSIS — Z8669 Personal history of other diseases of the nervous system and sense organs: Secondary | ICD-10-CM | POA: Insufficient documentation

## 2013-08-04 LAB — URINALYSIS, ROUTINE W REFLEX MICROSCOPIC
Glucose, UA: NEGATIVE mg/dL
Ketones, ur: NEGATIVE mg/dL
Leukocytes, UA: NEGATIVE
Protein, ur: NEGATIVE mg/dL
Urobilinogen, UA: 0.2 mg/dL (ref 0.0–1.0)

## 2013-08-04 LAB — URINE MICROSCOPIC-ADD ON

## 2013-08-04 MED ORDER — HYDROCODONE-ACETAMINOPHEN 5-325 MG PO TABS
2.0000 | ORAL_TABLET | Freq: Once | ORAL | Status: AC
  Administered 2013-08-04: 2 via ORAL
  Filled 2013-08-04: qty 2

## 2013-08-04 MED ORDER — ONDANSETRON 4 MG PO TBDP
8.0000 mg | ORAL_TABLET | Freq: Once | ORAL | Status: AC
  Administered 2013-08-04: 8 mg via ORAL
  Filled 2013-08-04: qty 2

## 2013-08-04 MED ORDER — IBUPROFEN 600 MG PO TABS: 600.0000 mg | ORAL_TABLET | Freq: Four times a day (QID) | ORAL | Status: DC | PRN

## 2013-08-04 MED ORDER — HYDROCODONE-ACETAMINOPHEN 5-325 MG PO TABS: ORAL_TABLET | ORAL | Status: DC

## 2013-08-04 MED ORDER — KETOROLAC TROMETHAMINE 60 MG/2ML IM SOLN
60.0000 mg | Freq: Once | INTRAMUSCULAR | Status: AC
  Administered 2013-08-04: 60 mg via INTRAMUSCULAR
  Filled 2013-08-04: qty 2

## 2013-08-04 NOTE — ED Notes (Signed)
Pt c/o lower abdominal pain that radiates to her back x 4 days. States this is her menstrual cramps.

## 2013-08-04 NOTE — ED Notes (Signed)
PA-C at bedside 

## 2013-08-04 NOTE — Progress Notes (Signed)
Received incoming call from [facility],, re prescriptions for Anner Crete.Patient information verified in EPIC.Pharmacy reports patient had two scripts for Norco/ Vicodin  5-325 MG  1- 2 Tablets- every six hours as needed for severe pain and a  Prescription for Advil / Motrin 600mg  every six hours as needed for pain-however patient only wanted to get the Norco/ Vicodin script filled.Discharge medications verefied in EPIC with Pharmacy and then verbal clarification confirmed with Dr Renne Crigler PA.Dr Emmit Alexanders verified that patient can Just Get the Norco/ Vicodin prescription filled.This Clinical research associate contacted [employer] and confirmed this information with the Pharmacy department.No further Case Manager needs.

## 2013-08-04 NOTE — ED Provider Notes (Signed)
Medical screening examination/treatment/procedure(s) were performed by non-physician practitioner and as supervising physician I was immediately available for consultation/collaboration.  Olivia Mackie, MD 08/04/13 [phone]

## 2013-08-04 NOTE — ED Provider Notes (Signed)
CSN: [id number]     Arrival date & time 08/04/13  0508 History   First MD Initiated Contact with Patient 08/04/13 [phone]     Chief Complaint  Patient presents with  . Abdominal Pain   (Consider location/radiation/quality/duration/timing/severity/associated sxs/prior Treatment) HPI Comments: Patient with no past history of abdominal surgeries, h/o abnormal pap smear  -- presents with complaint of lower abdominal pain x 5 days with radiation to the back that is associated with her menstrual period. Patient states she has had this exact same pain in the past. Lower abdominal cramps start 5 days before bleeding starts and she has had to come to ED multiple times for pain. Pt states pain has been not as severe over the past several months. At her last 2 ED visits, patient has had a negative CT scan. Prior to that, she had a normal pelvic US. The patient reiterates, this is the same pain that she has had at those visits with no differentiating features. She has typical nausea but no vomiting. No fever, diarrhea, constipation, urinary symptoms. She began having vaginal bleeding yesterday. She has taken ibuprofen, excedrin, and tylenol without relief. The onset of this condition was acute. The course is constant. Aggravating factors: none. Alleviating factors: none.    Patient is a 42 y.o. female presenting with abdominal pain. The history is provided by the patient.  Abdominal Pain Associated symptoms: nausea and vaginal bleeding   Associated symptoms: no chest pain, no cough, no diarrhea, no dysuria, no fever, no hematuria, no sore throat, no vaginal discharge and no vomiting     Past Medical History  Diagnosis Date  . Fibromyalgia   . Anemia 05/31/2012  . Endometrial cancer   . Migraine   . COPD (chronic obstructive pulmonary disease)   . Sleep apnea   . Asthma    Past Surgical History  Procedure Laterality Date  . Total shoulder replacement     Family History  Problem Relation Age of Onset   . Coronary artery disease Father   . Heart attack Father   . Hypertension Other   . Diabetes Other   . Cancer Other    History  Substance Use Topics  . Smoking status: Current Some Day Smoker -- 0.50 packs/day for 10 years    Types: Cigarettes  . Smokeless tobacco: Never Used  . Alcohol Use: No   OB History   Grav Para Term Preterm Abortions TAB SAB Ect Mult Living                 Review of Systems  Constitutional: Negative for fever.  HENT: Negative for sore throat and rhinorrhea.   Eyes: Negative for redness.  Respiratory: Negative for cough.   Cardiovascular: Negative for chest pain.  Gastrointestinal: Positive for nausea and abdominal pain. Negative for vomiting and diarrhea.  Genitourinary: Positive for vaginal bleeding and pelvic pain. Negative for dysuria, frequency, hematuria, flank pain and vaginal discharge.  Musculoskeletal: Negative for myalgias.  Skin: Negative for rash.  Neurological: Negative for headaches.    Allergies  Shellfish allergy and Tramadol  Home Medications   Current Outpatient Rx  Name  Route  Sig  Dispense  Refill  . albuterol (PROVENTIL HFA;VENTOLIN HFA) 108 (90 BASE) MCG/ACT inhaler   Inhalation   Inhale 2 puffs into the lungs every 6 (six) hours as needed for wheezing.         Marland Kitchen ibuprofen (ADVIL,MOTRIN) 200 MG tablet   Oral   Take 600 mg by mouth every  6 (six) hours as needed for pain.          BP 140/87  Pulse 81  Temp(Src) 97.5 F (36.4 C) (Oral)  Resp 19  SpO2 100% Physical Exam  Nursing note and vitals reviewed. Constitutional: She appears well-developed and well-nourished.  HENT:  Head: Normocephalic and atraumatic.  Eyes: Conjunctivae are normal. Right eye exhibits no discharge. Left eye exhibits no discharge.  Neck: Normal range of motion. Neck supple.  Cardiovascular: Normal rate, regular rhythm and normal heart sounds.   Pulmonary/Chest: Effort normal and breath sounds normal.  Abdominal: Soft. Bowel sounds  are normal. She exhibits no distension. There is tenderness in the right lower quadrant, suprapubic area and left lower quadrant. There is no rebound, no guarding and no CVA tenderness.  Neurological: She is alert.  Skin: Skin is warm and dry.  Psychiatric: She has a normal mood and affect.    ED Course  Procedures (including critical care time) Labs Review Labs Reviewed  URINALYSIS, ROUTINE W REFLEX MICROSCOPIC - Abnormal; Notable for the following:    Hgb urine dipstick LARGE (*)    All other components within normal limits  PREGNANCY, URINE  URINE MICROSCOPIC-ADD ON   Imaging Review No results found.  6:11 AM Patient seen and examined. Work-up initiated. Medications ordered. Previous visits and imaging reviewed.   Vital signs reviewed and are as follows: Filed Vitals:   08/04/13 0515  BP: 140/87  Pulse: 81  Temp: 97.5 F (36.4 C)  Resp: 19   6:38 AM UA shows blood only (suspect 2/2 menstrual bleeding), no sign of infection, no pregnancy.   7:26 AM patient with no relief after Toradol. Vicodin ordered. Patient informed of results of urine test. Reexam unchanged. Patient urged to followup with [facility] hospital clinic referral.  The patient was urged to return to the Emergency Department immediately with worsening of current symptoms, worsening abdominal pain, persistent vomiting, blood noted in stools, fever, or any other concerns. The patient verbalized understanding.   Patient counseled on use of narcotic pain medications. Counseled not to combine these medications with others containing tylenol. Urged not to drink alcohol, drive, or perform any other activities that requires focus while taking these medications. The patient verbalizes understanding and agrees with the plan.     MDM   1. Dysmenorrhea    Patient with lower abdominal cramping related to her menstruation. Patient has been seen in emergency department multiple times for this exact same pain. She has had  negative workup, CT scans, pelvic ultrasound. Patient is clear that this pain is unchanged from her previous episodes. On exam her abdomen is soft. I do not suspect significant intra-abdominal etiology of her pain. I feel patient will need GYN followup. No acute emergencies or indications for hospitalization suspected.   Renne Crigler, PA-C 08/04/13 [phone]

## 2013-08-04 NOTE — ED Notes (Signed)
Patient discharged to home with family. NAD.  

## 2013-08-29 ENCOUNTER — Encounter (HOSPITAL_COMMUNITY): Payer: Self-pay | Admitting: *Deleted

## 2013-08-29 ENCOUNTER — Inpatient Hospital Stay (HOSPITAL_COMMUNITY)
Admission: AD | Admit: 2013-08-29 | Discharge: 2013-08-29 | Disposition: A | Discharge status: Home / Self Care | Payer: PRIVATE HEALTH INSURANCE | Source: Ambulatory Visit | Attending: Obstetrics & Gynecology | Admitting: Obstetrics & Gynecology

## 2013-08-29 DIAGNOSIS — N939 Abnormal uterine and vaginal bleeding, unspecified: Secondary | ICD-10-CM

## 2013-08-29 DIAGNOSIS — F1110 Opioid abuse, uncomplicated: Secondary | ICD-10-CM

## 2013-08-29 DIAGNOSIS — N938 Other specified abnormal uterine and vaginal bleeding: Secondary | ICD-10-CM | POA: Insufficient documentation

## 2013-08-29 DIAGNOSIS — R109 Unspecified abdominal pain: Secondary | ICD-10-CM | POA: Insufficient documentation

## 2013-08-29 DIAGNOSIS — N949 Unspecified condition associated with female genital organs and menstrual cycle: Secondary | ICD-10-CM | POA: Insufficient documentation

## 2013-08-29 DIAGNOSIS — F17200 Nicotine dependence, unspecified, uncomplicated: Secondary | ICD-10-CM

## 2013-08-29 DIAGNOSIS — N898 Other specified noninflammatory disorders of vagina: Secondary | ICD-10-CM

## 2013-08-29 HISTORY — DX: Essential (primary) hypertension: I10

## 2013-08-29 LAB — URINALYSIS, ROUTINE W REFLEX MICROSCOPIC
Bilirubin Urine: NEGATIVE
Glucose, UA: NEGATIVE mg/dL
Leukocytes, UA: NEGATIVE
Nitrite: NEGATIVE
Specific Gravity, Urine: 1.025 (ref 1.005–1.030)
pH: 6.5 (ref 5.0–8.0)

## 2013-08-29 LAB — RAPID URINE DRUG SCREEN, HOSP PERFORMED
Cocaine: NOT DETECTED
Opiates: NOT DETECTED

## 2013-08-29 LAB — URINE MICROSCOPIC-ADD ON

## 2013-08-29 MED ORDER — KETOROLAC TROMETHAMINE 60 MG/2ML IM SOLN
60.0000 mg | Freq: Once | INTRAMUSCULAR | Status: AC
  Administered 2013-08-29: 60 mg via INTRAMUSCULAR
  Filled 2013-08-29: qty 2

## 2013-08-29 MED ORDER — NAPROXEN SODIUM 550 MG PO TABS: 550.0000 mg | ORAL_TABLET | Freq: Two times a day (BID) | ORAL | Status: DC

## 2013-08-29 NOTE — MAU Note (Signed)
Patient presents to MAU with c/o abdominal and back cramping 10/10 on pain scale. States has been told has cervical cancer. Reports  Heavy vaginal bleeding that started today with clots; states going through 4 pads per hour. Reports nausea.

## 2013-08-29 NOTE — MAU Provider Note (Signed)

## 2013-08-29 NOTE — MAU Provider Note (Signed)
History     CSN: [id number]  Arrival date and time: 08/29/13 1925   First Provider Initiated Contact with Patient 08/29/13 2146      Chief Complaint  Patient presents with  . Abdominal Cramping  . Vaginal Bleeding   HPI Comments: Autumn Gordon 42 y.o. [id number] presents to MAU with menstrual cramping and excessive bleding. She sytes she is soaking through 4 pads/ hour. She admits she just started her menses yesterday. She has had " bad periods" for over one year and multiple ER visits. In the past year she has gotten 8 different prescriptions for percocet and had 2 CT of Abdominal/ Pelvic that are normal. She was scheduled to visit the [facility] but could not keep her appointment due to work. She can not take BCPs as she is a smoker and says that depo provera causes her to bleed more.     Patient is a 42 y.o. female presenting with cramps and vaginal bleeding.  Abdominal Cramping The primary symptoms of the illness include abdominal pain.  Vaginal Bleeding Associated symptoms include abdominal pain.      Past Medical History  Diagnosis Date  . Fibromyalgia   . Anemia 05/31/2012  . Endometrial cancer   . Migraine   . COPD (chronic obstructive pulmonary disease)   . Sleep apnea   . Asthma   . Hypertension     Past Surgical History  Procedure Laterality Date  . Total shoulder replacement      Family History  Problem Relation Age of Onset  . Coronary artery disease Father   . Heart attack Father   . Hypertension Other   . Diabetes Other   . Cancer Other     History  Substance Use Topics  . Smoking status: Current Some Day Smoker -- 0.50 packs/day for 10 years    Types: Cigarettes  . Smokeless tobacco: Never Used  . Alcohol Use: No    Allergies:  Allergies  Allergen Reactions  . Shellfish Allergy Anaphylaxis  . Tramadol Hives    Prescriptions prior to admission  Medication Sig Dispense Refill  . acetaminophen (TYLENOL) 500 MG tablet Take  1,000 mg by mouth every 6 (six) hours as needed for pain (For cramps.).      Marland Kitchen albuterol (PROVENTIL HFA;VENTOLIN HFA) 108 (90 BASE) MCG/ACT inhaler Inhale 2 puffs into the lungs every 6 (six) hours as needed for wheezing.      Marland Kitchen BIOTIN PO Take 1 tablet by mouth daily.        Review of Systems  Constitutional: Negative.   HENT: Negative.   Eyes: Negative.   Respiratory: Negative.   Cardiovascular: Negative.   Gastrointestinal: Positive for abdominal pain.  Genitourinary: Negative.   Musculoskeletal: Negative.   Skin: Negative.   Neurological: Negative.   Psychiatric/Behavioral: Negative.    Physical Exam   Blood pressure 145/80, pulse 84, temperature 97.5 F (36.4 C), temperature source Oral, resp. rate 18, height 5\' 3"  (1.6 m), weight 173 lb 12.8 oz (78.835 kg), last menstrual period 08/01/2013, SpO2 100.00%.  Physical Exam  Constitutional: She appears well-developed and well-nourished. No distress.  HENT:  Head: Normocephalic.  Eyes: Pupils are equal, round, and reactive to light.  GI: Soft. She exhibits no distension and no mass. There is no tenderness. There is no rebound and no guarding.  Genitourinary:  Genital External: Negative Vagina: blood enough to cover 3 fox swabs only and did not bleed after that Cervix : closed Biman: Negative  Musculoskeletal: Normal range  of motion.  Neurological: She is alert.  Skin: Skin is warm.  Psychiatric: She has a normal mood and affect. Her behavior is normal. Judgment normal.    MAU Course  Procedures  MDM Toradol 60 mg IM  Assessment and Plan   A: Vaginal bleeding P: Toradol 60 mg IM Pt asked for and was denied " something stronger" Anaprox DS BID PRN Appointment to [facility], Rubbie Battiest 08/29/2013, 10:13 PM

## 2013-08-30 LAB — URINE CULTURE

## 2013-09-04 ENCOUNTER — Encounter: Payer: Self-pay | Admitting: Medical

## 2013-09-19 ENCOUNTER — Other Ambulatory Visit: Payer: Self-pay

## 2013-09-30 ENCOUNTER — Ambulatory Visit (INDEPENDENT_AMBULATORY_CARE_PROVIDER_SITE_OTHER): Payer: PRIVATE HEALTH INSURANCE | Admitting: Medical

## 2013-09-30 ENCOUNTER — Encounter: Payer: Self-pay | Admitting: Medical

## 2013-09-30 VITALS — BP 142/90 | HR 78 | Ht 63.0 in | Wt 168.5 lb

## 2013-09-30 DIAGNOSIS — Z8742 Personal history of other diseases of the female genital tract: Secondary | ICD-10-CM

## 2013-09-30 DIAGNOSIS — N920 Excessive and frequent menstruation with regular cycle: Secondary | ICD-10-CM

## 2013-09-30 DIAGNOSIS — Z87898 Personal history of other specified conditions: Secondary | ICD-10-CM

## 2013-09-30 LAB — CBC
HCT: 30.4 % — ABNORMAL LOW (ref 36.0–46.0)
Hemoglobin: 9.1 g/dL — ABNORMAL LOW (ref 12.0–15.0)
MCH: 20.8 pg — ABNORMAL LOW (ref 26.0–34.0)
MCHC: 29.9 g/dL — ABNORMAL LOW (ref 30.0–36.0)
MCV: 69.4 fL — ABNORMAL LOW (ref 78.0–100.0)

## 2013-09-30 NOTE — Progress Notes (Signed)
Patient dx with endometrial cancer in 2007, was told in 2012 it was still there and hasn't seen anyone since then for this. Patient states she never got any treatment, denies chemo and radiation.

## 2013-09-30 NOTE — Progress Notes (Signed)
Patient ID: Autumn Gordon, female   DOB: [birth year], 42 y.o.   MRN: [id number]  History:  Autumn Gordon  is a 42 y.o. [id number] who presents to clinic today for dysmenorrhea and menorrhagia. The patient reports 1 1/2 years of heavy, painful periods. Pain is interfering with her daily activities. Periods are regular and last 7 days. Patient reports associated symptoms of dizziness, weakness, nausea and headache. Patient was told that she had "cervical cancer" in 2007 in [place]. Patient had an abnormal pap smear and then a cone biopsy. Patient is currently taking Advil for pain without relief. LMP was 09/24/13 and stopped yesterday. Last pap smear was in 2012 in [place]. Patient also voices concern about elevated BP today. She states that she has been on Propranolol in the past, but has not taken it in ~ 2 weeks because she ran out. Patient states occasional chest discomfort x 2 weeks that resembles GERD symptoms. Patient denies headache, blurred vision today. Note in Epic from previous MAU visit shows that patient is not a candidate for OCPs secondary to elevated BP, smoking and age. Patient also states a history of Depo Provera use during which she bled for an extended period of time. Patient is concerned about the IUD because her friend had pain and expulsion of an IUD. Patient also states concerns about Nexplanon as she had a relative die from complications with the Nexplanon.   The following portions of the patient's history were reviewed and updated as appropriate: allergies, current medications, past family history, past medical history, past social history, past surgical history and problem list.  Review of Systems:  Pertinent items are noted in HPI.  Objective:  Physical Exam BP 142/90  Pulse 78  Ht 5\' 3"  (1.6 m)  Wt 168 lb 8 oz (76.431 kg)  BMI 29.86 kg/m2  LMP 09/24/2013 GENERAL: Well-developed, well-nourished female in no acute distress.  HEENT: Normocephalic, atraumatic.   LUNGS: Normal rate. Clear to auscultation bilaterally.  HEART: Regular rate and rhythm with no adventitious sounds.  BREASTS: Not performed ABDOMEN: Soft, mild tenderness to palpation of the epigastric area, nondistended. No organomegaly. Normal bowel sounds appreciated in all quadrants.  PELVIC: Normal external female genitalia. Vagina is pink and rugated.  Normal discharge. Evidence of previous cone biopsy noted with regards to cervix contour. Well-healed without any visible lesions noted. Pap smear obtained. Uterus is normal in size. No adnexal mass or tenderness.  EXTREMITIES: No cyanosis, clubbing, or edema.    Labs and Imaging Pap smear and CBC today  MDM Discussed IUD, Depo Provera, OCPs, Nexplanon, Ablation and D&E  Assessment & Plan:  Assessment: Dysmenorrhea Menorrhagia History of abnormal pap smear  Plans: 1. Pap smear today. Patient will be contacted with any abnormal results 2. CBC today. Patient will be contacted with any abnormal results 3. Patient given handouts on Mirena, Novasure and Depo Provera 4. Patient to return to [facility] clinic for follow-up in 2-3 weeks with MD to discuss further management 5. Patient advised to establish care with PCP for management of hypertension. Patient advised to seek immediate medical attention if symptoms of CVA develop   Freddi Starr, PA-C 09/30/2013 1:28 PM

## 2013-09-30 NOTE — Patient Instructions (Signed)
Dysfunctional Uterine Bleeding Normally, menstrual periods begin between ages 11 to 17 in young women. A normal menstrual cycle/period may begin every 23 days up to 35 days and lasts from 1 to 7 days. Around 12 to 14 days before your menstrual period starts, ovulation (ovary produces an egg) occurs. When counting the time between menstrual periods, count from the first day of bleeding of the previous period to the first day of bleeding of the next period. Dysfunctional (abnormal) uterine bleeding is bleeding that is different from a normal menstrual period. Your periods may come earlier or later than usual. They may be lighter, have blood clots or be heavier. You may have bleeding between periods, or you may skip one period or more. You may have bleeding after sexual intercourse, bleeding after menopause, or no menstrual period. CAUSES   Pregnancy (normal, miscarriage, tubal).  IUDs (intrauterine device, birth control).  Birth control pills.  Hormone treatment.  Menopause.  Infection of the cervix.  Blood clotting problems.  Infection of the inside lining of the uterus.  Endometriosis, inside lining of the uterus growing in the pelvis and other female organs.  Adhesions (scar tissue) inside the uterus.  Obesity or severe weight loss.  Uterine polyps inside the uterus.  Cancer of the vagina, cervix, or uterus.  Ovarian cysts or polycystic ovary syndrome.  Medical problems (diabetes, thyroid disease).  Uterine fibroids (noncancerous tumor).  Problems with your female hormones.  Endometrial hyperplasia, very thick lining and enlarged cells inside of the uterus.  Medicines that interfere with ovulation.  Radiation to the pelvis or abdomen.  Chemotherapy. DIAGNOSIS   Your doctor will discuss the history of your menstrual periods, medicines you are taking, changes in your weight, stress in your life, and any medical problems you may have.  Your doctor will do a physical  and pelvic examination.  Your doctor may want to perform certain tests to make a diagnosis, such as:  Pap test.  Blood tests.  Cultures for infection.  CT scan.  Ultrasound.  Hysteroscopy.  Laparoscopy.  MRI.  Hysterosalpingography.  D and C.  Endometrial biopsy. TREATMENT  Treatment will depend on the cause of the dysfunctional uterine bleeding (DUB). Treatment may include:  Observing your menstrual periods for a couple of months.  Prescribing medicines for medical problems, including:  Antibiotics.  Hormones.  Birth control pills.  Removing an IUD (intrauterine device, birth control).  Surgery:  D and C (scrape and remove tissue from inside the uterus).  Laparoscopy (examine inside the abdomen with a lighted tube).  Uterine ablation (destroy lining of the uterus with electrical current, laser, heat, or freezing).  Hysteroscopy (examine cervix and uterus with a lighted tube).  Hysterectomy (remove the uterus). HOME CARE INSTRUCTIONS   If medicines were prescribed, take exactly as directed. Do not change or switch medicines without consulting your caregiver.  Long term heavy bleeding may result in iron deficiency. Your caregiver may have prescribed iron pills. They help replace the iron that your body lost from heavy bleeding. Take exactly as directed.  Do not take aspirin or medicines that contain aspirin one week before or during your menstrual period. Aspirin may make the bleeding worse.  If you need to change your sanitary pad or tampon more than once every 2 hours, stay in bed with your feet elevated and a cold pack on your lower abdomen. Rest as much as possible, until the bleeding stops or slows down.  Eat well-balanced meals. Eat foods high in iron. Examples   are:  Leafy green vegetables.  Whole-grain breads and cereals.  Eggs.  Meat.  Liver.  Do not try to lose weight until the abnormal bleeding has stopped and your blood iron level is  back to normal. Do not lift more than ten pounds or do strenuous activities when you are bleeding.  For a couple of months, make note on your calendar, marking the start and ending of your period, and the type of bleeding (light, medium, heavy, spotting, clots or missed periods). This is for your caregiver to better evaluate your problem. SEEK MEDICAL CARE IF:   You develop nausea (feeling sick to your stomach) and vomiting, dizziness, or diarrhea while you are taking your medicine.  You are getting lightheaded or weak.  You have any problems that may be related to the medicine you are taking.  You develop pain with your DUB.  You want to remove your IUD.  You want to stop or change your birth control pills or hormones.  You have any type of abnormal bleeding mentioned above.  You are over 43 years old and have not had a menstrual period yet.  You are 42 years old and you are still having menstrual periods.  You have any of the symptoms mentioned above.  You develop a rash. SEEK IMMEDIATE MEDICAL CARE IF:   An oral temperature above 102 F (38.9 C) develops.  You develop chills.  You are changing your sanitary pad or tampon more than once an hour.  You develop abdominal pain.  You pass out or faint. Document Released: 10/28/2000 Document Revised: 01/23/2012 Document Reviewed: 09/29/2009 [facility] Patient Information 2014 [place], [place]. Medroxyprogesterone injection [Contraceptive] What is this medicine? MEDROXYPROGESTERONE (me DROX ee proe JES te rone) contraceptive injections prevent pregnancy. They provide effective birth control for 3 months. Depo-subQ Provera 104 is also used for treating pain related to endometriosis. This medicine may be used for other purposes; ask your health care provider or pharmacist if you have questions. COMMON BRAND NAME(S): Depo-Provera, Depo-subQ Provera 104 What should I tell my health care provider before I take this medicine? They  need to know if you have any of these conditions: -frequently drink alcohol -asthma -blood vessel disease or a history of a blood clot in the lungs or legs -bone disease such as osteoporosis -breast cancer -diabetes -eating disorder (anorexia nervosa or bulimia) -high blood pressure -HIV infection or AIDS -kidney disease -liver disease -mental depression -migraine -seizures (convulsions) -stroke -tobacco smoker -vaginal bleeding -an unusual or allergic reaction to medroxyprogesterone, other hormones, medicines, foods, dyes, or preservatives -pregnant or trying to get pregnant -breast-feeding How should I use this medicine? Depo-Provera Contraceptive injection is given into a muscle. Depo-subQ Provera 104 injection is given under the skin. These injections are given by a health care professional. You must not be pregnant before getting an injection. The injection is usually given during the first 5 days after the start of a menstrual period or 6 weeks after delivery of a baby. Talk to your pediatrician regarding the use of this medicine in children. Special care may be needed. These injections have been used in female children who have started having menstrual periods. Overdosage: If you think you have taken too much of this medicine contact a poison control center or emergency room at once. NOTE: This medicine is only for you. Do not share this medicine with others. What if I miss a dose? Try not to miss a dose. You must get an injection once every 3  months to maintain birth control. If you cannot keep an appointment, call and reschedule it. If you wait longer than 13 weeks between Depo-Provera contraceptive injections or longer than 14 weeks between Depo-subQ Provera 104 injections, you could get pregnant. Use another method for birth control if you miss your appointment. You may also need a pregnancy test before receiving another injection. What may interact with this medicine? Do not  take this medicine with any of the following medications: -bosentan This medicine may also interact with the following medications: -aminoglutethimide -antibiotics or medicines for infections, especially rifampin, rifabutin, rifapentine, and griseofulvin -aprepitant -barbiturate medicines such as phenobarbital or primidone -bexarotene -carbamazepine -medicines for seizures like ethotoin, felbamate, oxcarbazepine, phenytoin, topiramate -modafinil -St. John's wort This list may not describe all possible interactions. Give your health care provider a list of all the medicines, herbs, non-prescription drugs, or dietary supplements you use. Also tell them if you smoke, drink alcohol, or use illegal drugs. Some items may interact with your medicine. What should I watch for while using this medicine? This drug does not protect you against HIV infection (AIDS) or other sexually transmitted diseases. Use of this product may cause you to lose calcium from your bones. Loss of calcium may cause weak bones (osteoporosis). Only use this product for more than 2 years if other forms of birth control are not right for you. The longer you use this product for birth control the more likely you will be at risk for weak bones. Ask your health care professional how you can keep strong bones. You may have a change in bleeding pattern or irregular periods. Many females stop having periods while taking this drug. If you have received your injections on time, your chance of being pregnant is very low. If you think you may be pregnant, see your health care professional as soon as possible. Tell your health care professional if you want to get pregnant within the next year. The effect of this medicine may last a long time after you get your last injection. What side effects may I notice from receiving this medicine? Side effects that you should report to your doctor or health care professional as soon as  possible: -allergic reactions like skin rash, itching or hives, swelling of the face, lips, or tongue -breast tenderness or discharge -breathing problems -changes in vision -depression -feeling faint or lightheaded, falls -fever -pain in the abdomen, chest, groin, or leg -problems with balance, talking, walking -unusually weak or tired -yellowing of the eyes or skin Side effects that usually do not require medical attention (report to your doctor or health care professional if they continue or are bothersome): -acne -fluid retention and swelling -headache -irregular periods, spotting, or absent periods -temporary pain, itching, or skin reaction at site where injected -weight gain This list may not describe all possible side effects. Call your doctor for medical advice about side effects. You may report side effects to FDA at 1-800-FDA-1088. Where should I keep my medicine? This does not apply. The injection will be given to you by a health care professional. NOTE: This sheet is a summary. It may not cover all possible information. If you have questions about this medicine, talk to your doctor, pharmacist, or health care provider.  2014, [employer]. (2008-11-21 18:37:56)

## 2013-10-01 ENCOUNTER — Telehealth: Payer: Self-pay | Admitting: *Deleted

## 2013-10-01 NOTE — Telephone Encounter (Signed)
10/01/2013:  Message left on machine for her to  Call the office.

## 2013-10-01 NOTE — Telephone Encounter (Signed)
Message copied by Dorothyann Peng on Tue Oct 01, 2013  1:49 PM ------      Message from: Freddi Starr      Created: Tue Oct 01, 2013  8:23 AM       Please call patient and confirm that she is taking Iron supplements. If she is not she should start taking iron BID and we will re-check in 6 weeks. Please schedule her a lab only appointment in ~6 weeks to re-check a CBC. If she wants Rx I can send it if it will be less expensive for her.             Thanks!            Raynelle Fanning ------

## 2013-10-03 NOTE — Telephone Encounter (Signed)
mychart message sent to patient

## 2013-10-07 ENCOUNTER — Telehealth: Payer: Self-pay | Admitting: *Deleted

## 2013-10-07 NOTE — Telephone Encounter (Signed)
10/07/2013:  Pt called and spoke with nurse regarding test results, test results were delivered, no other questions.

## 2013-10-22 ENCOUNTER — Encounter (HOSPITAL_COMMUNITY): Payer: Self-pay | Admitting: Emergency Medicine

## 2013-10-22 ENCOUNTER — Emergency Department (HOSPITAL_COMMUNITY)
Admission: EM | Admit: 2013-10-22 | Discharge: 2013-10-22 | Disposition: A | Discharge status: Home / Self Care | Payer: Self-pay | Attending: Emergency Medicine | Admitting: Emergency Medicine

## 2013-10-22 ENCOUNTER — Emergency Department (HOSPITAL_COMMUNITY): Payer: Self-pay

## 2013-10-22 DIAGNOSIS — Z8669 Personal history of other diseases of the nervous system and sense organs: Secondary | ICD-10-CM | POA: Insufficient documentation

## 2013-10-22 DIAGNOSIS — J449 Chronic obstructive pulmonary disease, unspecified: Secondary | ICD-10-CM | POA: Insufficient documentation

## 2013-10-22 DIAGNOSIS — M545 Low back pain, unspecified: Secondary | ICD-10-CM | POA: Insufficient documentation

## 2013-10-22 DIAGNOSIS — Z8544 Personal history of malignant neoplasm of other female genital organs: Secondary | ICD-10-CM | POA: Insufficient documentation

## 2013-10-22 DIAGNOSIS — D649 Anemia, unspecified: Secondary | ICD-10-CM | POA: Insufficient documentation

## 2013-10-22 DIAGNOSIS — R062 Wheezing: Secondary | ICD-10-CM | POA: Insufficient documentation

## 2013-10-22 DIAGNOSIS — IMO0001 Reserved for inherently not codable concepts without codable children: Secondary | ICD-10-CM | POA: Insufficient documentation

## 2013-10-22 DIAGNOSIS — R1110 Vomiting, unspecified: Secondary | ICD-10-CM | POA: Insufficient documentation

## 2013-10-22 DIAGNOSIS — R0602 Shortness of breath: Secondary | ICD-10-CM | POA: Insufficient documentation

## 2013-10-22 DIAGNOSIS — F17200 Nicotine dependence, unspecified, uncomplicated: Secondary | ICD-10-CM | POA: Insufficient documentation

## 2013-10-22 DIAGNOSIS — Z8709 Personal history of other diseases of the respiratory system: Secondary | ICD-10-CM | POA: Insufficient documentation

## 2013-10-22 DIAGNOSIS — G4730 Sleep apnea, unspecified: Secondary | ICD-10-CM | POA: Insufficient documentation

## 2013-10-22 DIAGNOSIS — Z79899 Other long term (current) drug therapy: Secondary | ICD-10-CM | POA: Insufficient documentation

## 2013-10-22 DIAGNOSIS — J4489 Other specified chronic obstructive pulmonary disease: Secondary | ICD-10-CM | POA: Insufficient documentation

## 2013-10-22 DIAGNOSIS — Z888 Allergy status to other drugs, medicaments and biological substances status: Secondary | ICD-10-CM | POA: Insufficient documentation

## 2013-10-22 DIAGNOSIS — I10 Essential (primary) hypertension: Secondary | ICD-10-CM | POA: Insufficient documentation

## 2013-10-22 DIAGNOSIS — R0682 Tachypnea, not elsewhere classified: Secondary | ICD-10-CM | POA: Insufficient documentation

## 2013-10-22 DIAGNOSIS — M549 Dorsalgia, unspecified: Secondary | ICD-10-CM

## 2013-10-22 DIAGNOSIS — R6883 Chills (without fever): Secondary | ICD-10-CM | POA: Insufficient documentation

## 2013-10-22 DIAGNOSIS — J069 Acute upper respiratory infection, unspecified: Secondary | ICD-10-CM | POA: Insufficient documentation

## 2013-10-22 LAB — CBC WITH DIFFERENTIAL/PLATELET
Basophils Absolute: 0.1 10*3/uL (ref 0.0–0.1)
Eosinophils Absolute: 0.2 10*3/uL (ref 0.0–0.7)
Eosinophils Relative: 4 % (ref 0–5)
Lymphocytes Relative: 32 % (ref 12–46)
MCH: 20.9 pg — ABNORMAL LOW (ref 26.0–34.0)
MCHC: 31.2 g/dL (ref 30.0–36.0)
Monocytes Absolute: 0.3 10*3/uL (ref 0.1–1.0)
Monocytes Relative: 6 % (ref 3–12)
Neutro Abs: 3.3 10*3/uL (ref 1.7–7.7)
Neutrophils Relative %: 57 % (ref 43–77)
Platelets: 424 10*3/uL — ABNORMAL HIGH (ref 150–400)
RBC: 4.22 MIL/uL (ref 3.87–5.11)

## 2013-10-22 LAB — COMPREHENSIVE METABOLIC PANEL
AST: 17 U/L (ref 0–37)
Albumin: 3.6 g/dL (ref 3.5–5.2)
Alkaline Phosphatase: 68 U/L (ref 39–117)
Calcium: 9.1 mg/dL (ref 8.4–10.5)
Creatinine, Ser: 0.63 mg/dL (ref 0.50–1.10)
GFR calc non Af Amer: >90 mL/min (ref >90)
Glucose, Bld: 82 mg/dL (ref 70–99)
Potassium: 3.3 mEq/L — ABNORMAL LOW (ref 3.5–5.1)
Sodium: 139 mEq/L (ref 135–145)
Total Protein: 7.4 g/dL (ref 6.0–8.3)

## 2013-10-22 LAB — URINALYSIS, ROUTINE W REFLEX MICROSCOPIC
Bilirubin Urine: NEGATIVE
Glucose, UA: NEGATIVE mg/dL
Hgb urine dipstick: NEGATIVE
Ketones, ur: NEGATIVE mg/dL
Leukocytes, UA: NEGATIVE
Nitrite: NEGATIVE
Specific Gravity, Urine: 1.019 (ref 1.005–1.030)
Urobilinogen, UA: 0.2 mg/dL (ref 0.0–1.0)
pH: 6.5 (ref 5.0–8.0)

## 2013-10-22 MED ORDER — KETOROLAC TROMETHAMINE 30 MG/ML IJ SOLN: 30.0000 mg | Freq: Once | INTRAMUSCULAR | Status: DC

## 2013-10-22 MED ORDER — PREDNISONE 20 MG PO TABS: 60.0000 mg | ORAL_TABLET | Freq: Every day | ORAL | Status: AC

## 2013-10-22 MED ORDER — DEXTROMETHORPHAN-GUAIFENESIN 10-200 MG/5ML PO LIQD: ORAL | Status: DC

## 2013-10-22 MED ORDER — DIAZEPAM 5 MG PO TABS: 5.0000 mg | ORAL_TABLET | Freq: Two times a day (BID) | ORAL | Status: DC

## 2013-10-22 MED ORDER — KETOROLAC TROMETHAMINE 60 MG/2ML IM SOLN
60.0000 mg | Freq: Once | INTRAMUSCULAR | Status: AC
  Administered 2013-10-22: 60 mg via INTRAMUSCULAR
  Filled 2013-10-22: qty 2

## 2013-10-22 MED ORDER — PREDNISONE 20 MG PO TABS
60.0000 mg | ORAL_TABLET | ORAL | Status: AC
  Administered 2013-10-22: 60 mg via ORAL
  Filled 2013-10-22: qty 3

## 2013-10-22 NOTE — ED Notes (Signed)
Pt remains in Xray  

## 2013-10-22 NOTE — ED Notes (Signed)
Patient taken to CT.

## 2013-10-22 NOTE — ED Notes (Signed)
Pt sts N/V x 2 days and felt back "pop" while vomiting and now having increased pain

## 2013-10-22 NOTE — ED Notes (Signed)
Unable to locate pt x1

## 2013-10-22 NOTE — ED Provider Notes (Signed)
CSN: [id number]     Arrival date & time 10/22/13  1255 History   First MD Initiated Contact with Patient 10/22/13 1625     Chief Complaint  Patient presents with  . Back Pain  . Emesis   (Consider location/radiation/quality/duration/timing/severity/associated sxs/prior Treatment) HPI Patient presents with concern of low back pain, cough, congestion, chills. Patient's upper respiratory symptoms began within the past week.  They have been persistent, with increasing discomfort, increasing cough over that timeframe. 2 days ago after an episode of coughing, the patient felt the acute onset of pain in her mid low back.  The pain is severe, nonradiating, sharp. No no distal dysesthesia or weakness, no incontinence, no fall. Patient has a minimal relief with OTC medication. Similarly, this is minimal relief from her URI like symptoms with OTC medication. No ongoing objective fever, confusion, disorientation, chest pain.  Past Medical History  Diagnosis Date  . Fibromyalgia   . Anemia 05/31/2012  . Endometrial cancer   . Migraine   . COPD (chronic obstructive pulmonary disease)   . Sleep apnea   . Asthma   . Hypertension    Past Surgical History  Procedure Laterality Date  . Total shoulder replacement     Family History  Problem Relation Age of Onset  . Coronary artery disease Father   . Heart attack Father   . Hypertension Other   . Diabetes Other   . Cancer Other    History  Substance Use Topics  . Smoking status: Current Some Day Smoker -- 0.50 packs/day for 10 years    Types: Cigarettes  . Smokeless tobacco: Never Used  . Alcohol Use: No   OB History   Grav Para Term Preterm Abortions TAB SAB Ect Mult Living   10 9 2 7 1 1    9      Review of Systems  Constitutional:       Per HPI, otherwise negative  HENT:       Per HPI, otherwise negative  Respiratory:       Per HPI, otherwise negative  Cardiovascular:       Per HPI, otherwise negative  Gastrointestinal:  Negative for vomiting.  Endocrine:       Negative aside from HPI  Genitourinary:       Neg aside from HPI   Musculoskeletal:       Per HPI, otherwise negative  Skin: Negative.   Neurological: Negative for syncope.    Allergies  Shellfish allergy and Tramadol  Home Medications   Current Outpatient Rx  Name  Route  Sig  Dispense  Refill  . albuterol (PROVENTIL HFA;VENTOLIN HFA) 108 (90 BASE) MCG/ACT inhaler   Inhalation   Inhale 2 puffs into the lungs every 6 (six) hours as needed for wheezing or shortness of breath.          . naproxen sodium (ANAPROX) 220 MG tablet   Oral   Take 660 mg by mouth 2 (two) times daily as needed (pain).          BP 143/89  Pulse 81  Temp(Src) 98.8 F (37.1 C) (Oral)  Resp 16  Ht 5\' 4"  (1.626 m)  Wt 172 lb (78.019 kg)  BMI 29.51 kg/m2  SpO2 100%  LMP 09/24/2013 Physical Exam  Nursing note and vitals reviewed. Constitutional: She is oriented to person, place, and time. She appears well-developed and well-nourished. No distress.  HENT:  Head: Normocephalic and atraumatic.  Eyes: Conjunctivae and EOM are normal.  Cardiovascular: Normal  rate and regular rhythm.   Pulmonary/Chest: No stridor. Tachypnea noted. She has wheezes.  Abdominal: She exhibits no distension. There is no tenderness.  Musculoskeletal: She exhibits no edema.       Arms: Neurological: She is alert and oriented to person, place, and time. No cranial nerve deficit. She exhibits normal muscle tone. Coordination normal.  Skin: Skin is warm and dry.  Psychiatric: She has a normal mood and affect.    ED Course  Procedures (including critical care time) Labs Review Labs Reviewed  CBC WITH DIFFERENTIAL - Abnormal; Notable for the following:    Hemoglobin 8.8 (*)    HCT 28.2 (*)    MCV 66.8 (*)    MCH 20.9 (*)    RDW 17.0 (*)    Platelets 424 (*)    All other components within normal limits  COMPREHENSIVE METABOLIC PANEL - Abnormal; Notable for the following:     Potassium 3.3 (*)    Total Bilirubin 0.1 (*)    All other components within normal limits  URINALYSIS, ROUTINE W REFLEX MICROSCOPIC  LIPASE, BLOOD   Imaging Review Dg Chest 2 View  10/22/2013   CLINICAL DATA:  42 year old female with cough and shortness of Breath. Initial encounter.  EXAM: CHEST  2 VIEW  COMPARISON:  08/21/2012 and earlier.  FINDINGS: Chronic right shoulder arthroplasty changes. Stable visualized osseous structures. Lung volumes remain within normal limits. Normal cardiac size and mediastinal contours. Visualized tracheal air column is within normal limits. Mildly increased pulmonary interstitial markings are stable. No pneumothorax, pulmonary edema, pleural effusion or confluent pulmonary opacity.  IMPRESSION: No acute cardiopulmonary abnormality.   Electronically Signed   By: Augusto Gamble M.D.   On: 10/22/2013 19:17    EKG Interpretation   None       MDM  No diagnosis found. This patient presents with 2 main complaints.  The patient's primary complaint is back pain.  This seems musculoskeletal, given the absence of neurovascular changes the absence of distress. In addition the patient presents with respiratory concerns - no e/o pna. With her Hx of COPD, she was started on a course of meds (steroids, muscle relaxants, pain meds) and d/c in stable condition.    Gerhard Munch, MD 10/22/13 1924

## 2013-10-22 NOTE — ED Notes (Signed)
Pt comfortable with d/c and f/u instructions. Prescriptions x3 

## 2013-10-25 ENCOUNTER — Encounter: Payer: PRIVATE HEALTH INSURANCE | Admitting: Obstetrics & Gynecology

## 2014-03-29 ENCOUNTER — Emergency Department (HOSPITAL_COMMUNITY): Payer: 59

## 2014-03-29 ENCOUNTER — Encounter (HOSPITAL_COMMUNITY): Payer: Self-pay | Admitting: Emergency Medicine

## 2014-03-29 ENCOUNTER — Emergency Department (HOSPITAL_COMMUNITY)
Admission: EM | Admit: 2014-03-29 | Discharge: 2014-03-30 | Disposition: A | Discharge status: Home / Self Care | Payer: 59 | Attending: Emergency Medicine | Admitting: Emergency Medicine

## 2014-03-29 DIAGNOSIS — Z8739 Personal history of other diseases of the musculoskeletal system and connective tissue: Secondary | ICD-10-CM | POA: Insufficient documentation

## 2014-03-29 DIAGNOSIS — J45909 Unspecified asthma, uncomplicated: Secondary | ICD-10-CM | POA: Insufficient documentation

## 2014-03-29 DIAGNOSIS — R110 Nausea: Secondary | ICD-10-CM

## 2014-03-29 DIAGNOSIS — R1011 Right upper quadrant pain: Secondary | ICD-10-CM | POA: Insufficient documentation

## 2014-03-29 DIAGNOSIS — Z8542 Personal history of malignant neoplasm of other parts of uterus: Secondary | ICD-10-CM | POA: Insufficient documentation

## 2014-03-29 DIAGNOSIS — R079 Chest pain, unspecified: Secondary | ICD-10-CM | POA: Insufficient documentation

## 2014-03-29 DIAGNOSIS — J441 Chronic obstructive pulmonary disease with (acute) exacerbation: Secondary | ICD-10-CM | POA: Insufficient documentation

## 2014-03-29 DIAGNOSIS — R112 Nausea with vomiting, unspecified: Secondary | ICD-10-CM | POA: Insufficient documentation

## 2014-03-29 DIAGNOSIS — Z3202 Encounter for pregnancy test, result negative: Secondary | ICD-10-CM | POA: Insufficient documentation

## 2014-03-29 DIAGNOSIS — Z79899 Other long term (current) drug therapy: Secondary | ICD-10-CM | POA: Insufficient documentation

## 2014-03-29 DIAGNOSIS — Z862 Personal history of diseases of the blood and blood-forming organs and certain disorders involving the immune mechanism: Secondary | ICD-10-CM | POA: Insufficient documentation

## 2014-03-29 DIAGNOSIS — I10 Essential (primary) hypertension: Secondary | ICD-10-CM | POA: Insufficient documentation

## 2014-03-29 DIAGNOSIS — F17200 Nicotine dependence, unspecified, uncomplicated: Secondary | ICD-10-CM | POA: Insufficient documentation

## 2014-03-29 LAB — LIPASE, BLOOD: Lipase: 26 U/L (ref 11–59)

## 2014-03-29 LAB — POC URINE PREG, ED: Preg Test, Ur: NEGATIVE

## 2014-03-29 LAB — CBC
HEMATOCRIT: 27.8 % — AB (ref 36.0–46.0)
Hemoglobin: 8.4 g/dL — ABNORMAL LOW (ref 12.0–15.0)
MCH: 18.8 pg — ABNORMAL LOW (ref 26.0–34.0)
MCHC: 30.2 g/dL (ref 30.0–36.0)
MCV: 62.1 fL — ABNORMAL LOW (ref 78.0–100.0)
Platelets: 261 10*3/uL (ref 150–400)
RBC: 4.48 MIL/uL (ref 3.87–5.11)
RDW: 18.9 % — AB (ref 11.5–15.5)
WBC: 6.5 10*3/uL (ref 4.0–10.5)

## 2014-03-29 LAB — I-STAT TROPONIN, ED: Troponin i, poc: 0 ng/mL (ref 0.00–0.08)

## 2014-03-29 LAB — BASIC METABOLIC PANEL
BUN: 8 mg/dL (ref 6–23)
CO2: 21 mEq/L (ref 19–32)
Calcium: 9.4 mg/dL (ref 8.4–10.5)
Chloride: 99 mEq/L (ref 96–112)
Creatinine, Ser: 0.57 mg/dL (ref 0.50–1.10)
Glucose, Bld: 79 mg/dL (ref 70–99)
POTASSIUM: 3.5 meq/L — AB (ref 3.7–5.3)
SODIUM: 134 meq/L — AB (ref 137–147)

## 2014-03-29 LAB — HEPATIC FUNCTION PANEL
ALK PHOS: 68 U/L (ref 39–117)
ALT: 13 U/L (ref 0–35)
AST: 20 U/L (ref 0–37)
Albumin: 4.4 g/dL (ref 3.5–5.2)
TOTAL PROTEIN: 7.9 g/dL (ref 6.0–8.3)
Total Bilirubin: 0.2 mg/dL — ABNORMAL LOW (ref 0.3–1.2)

## 2014-03-29 MED ORDER — MORPHINE SULFATE 4 MG/ML IJ SOLN
4.0000 mg | Freq: Once | INTRAMUSCULAR | Status: AC
  Administered 2014-03-29: 4 mg via INTRAVENOUS
  Filled 2014-03-29: qty 1

## 2014-03-29 MED ORDER — ONDANSETRON 4 MG PO TBDP
4.0000 mg | ORAL_TABLET | Freq: Once | ORAL | Status: AC
  Administered 2014-03-29: 4 mg via ORAL
  Filled 2014-03-29: qty 1

## 2014-03-29 MED ORDER — IOHEXOL 300 MG/ML  SOLN
100.0000 mL | Freq: Once | INTRAMUSCULAR | Status: AC | PRN
  Administered 2014-03-29: 100 mL via INTRAVENOUS

## 2014-03-29 MED ORDER — GI COCKTAIL ~~LOC~~
30.0000 mL | Freq: Once | ORAL | Status: AC
Start: 2014-03-29 — End: 2014-03-29
  Administered 2014-03-29: 30 mL via ORAL
  Filled 2014-03-29: qty 30

## 2014-03-29 MED ORDER — IOHEXOL 300 MG/ML  SOLN
20.0000 mL | INTRAMUSCULAR | Status: AC
  Administered 2014-03-29: 20 mL via ORAL

## 2014-03-29 MED ORDER — ONDANSETRON HCL 4 MG/2ML IJ SOLN
4.0000 mg | Freq: Once | INTRAMUSCULAR | Status: AC
  Administered 2014-03-29: 4 mg via INTRAVENOUS
  Filled 2014-03-29: qty 2

## 2014-03-29 NOTE — ED Notes (Signed)
To ct

## 2014-03-29 NOTE — ED Notes (Signed)
C/o pain and nausea in her chest is the pain

## 2014-03-29 NOTE — ED Provider Notes (Signed)
CSN: [id number]     Arrival date & time 03/29/14  1336 History   First MD Initiated Contact with Patient 03/29/14 1625     Chief Complaint  Patient presents with  . Chest Pain  . Emesis     (Consider location/radiation/quality/duration/timing/severity/associated sxs/prior Treatment) HPI Comments: 43 year old female with a past medical history of migraines, COPD, asthma, hypertension and fibromyalgia presents to the emergency department complaining of nausea and vomiting x2 days with associated chest pain beginning today. Patient states while she was sleeping 2 days ago she woke up feeling nauseated and had about 5 episodes of nonbloody emesis which reoccurred today. Chest pain began earlier this morning when she woke up from sleep, located in the center of her chest with occasional radiation to the right side of her jaw. States she has a normal appetite. Denies abdominal pain, fevers, cough or diarrhea. Slight shortness of breath. She has not had any alleviating factors. She is a smoker. Her father passed away from a heart attack in his 26s.  Patient is a 43 y.o. female presenting with chest pain and vomiting. The history is provided by the patient.  Chest Pain Associated symptoms: nausea, shortness of breath and vomiting   Emesis   Past Medical History  Diagnosis Date  . Fibromyalgia   . Anemia 05/31/2012  . Endometrial cancer   . Migraine   . COPD (chronic obstructive pulmonary disease)   . Sleep apnea   . Asthma   . Hypertension    Past Surgical History  Procedure Laterality Date  . Total shoulder replacement     Family History  Problem Relation Age of Onset  . Coronary artery disease Father   . Heart attack Father   . Hypertension Other   . Diabetes Other   . Cancer Other    History  Substance Use Topics  . Smoking status: Current Some Day Smoker -- 0.50 packs/day for 10 years    Types: Cigarettes  . Smokeless tobacco: Never Used  . Alcohol Use: No   OB History    Grav Para Term Preterm Abortions TAB SAB Ect Mult Living   10 9 2 7 1 1    9      Review of Systems  Respiratory: Positive for shortness of breath.   Cardiovascular: Positive for chest pain.  Gastrointestinal: Positive for nausea and vomiting.  All other systems reviewed and are negative.     Allergies  Shellfish allergy and Tramadol  Home Medications   Prior to Admission medications   Medication Sig Start Date End Date Taking? Authorizing Provider  albuterol (PROVENTIL HFA;VENTOLIN HFA) 108 (90 BASE) MCG/ACT inhaler Inhale 2 puffs into the lungs every 6 (six) hours as needed for wheezing or shortness of breath.     Historical Provider, MD  Dextromethorphan-Guaifenesin (TUSSIN DM MAX) 10-200 MG/5ML LIQD Use as directed on packaging 10/22/13   Carmin Muskrat, MD  diazepam (VALIUM) 5 MG tablet Take 1 tablet (5 mg total) by mouth 2 (two) times daily. 10/22/13   Carmin Muskrat, MD  naproxen sodium (ANAPROX) 220 MG tablet Take 660 mg by mouth 2 (two) times daily as needed (pain).    Historical Provider, MD   BP 136/86  Pulse 78  Temp(Src) 98.4 F (36.9 C) (Oral)  Resp 18  SpO2 100%  LMP 03/14/2014 Physical Exam  Nursing note and vitals reviewed. Constitutional: She is oriented to person, place, and time. She appears well-developed and well-nourished. No distress.  HENT:  Head: Normocephalic and atraumatic.  Mouth/Throat: Oropharynx is clear and moist.  Eyes: Conjunctivae and EOM are normal. Pupils are equal, round, and reactive to light.  Neck: Normal range of motion. Neck supple. No JVD present.  Cardiovascular: Normal rate, regular rhythm, normal heart sounds and intact distal pulses.   No extremity edema.  Pulmonary/Chest: Effort normal and breath sounds normal. No respiratory distress. She exhibits no tenderness.  Abdominal: Soft. Normal appearance and bowel sounds are normal. She exhibits no distension. There is tenderness in the right upper quadrant. There is guarding.  There is no rigidity, no rebound and negative Murphy's sign.  No peritoneal signs.  Musculoskeletal: Normal range of motion. She exhibits no edema.  Neurological: She is alert and oriented to person, place, and time. She has normal strength. No sensory deficit.  Speech fluent, goal oriented. Moves limbs without ataxia. Equal grip strength bilateral.  Skin: Skin is warm and dry. She is not diaphoretic.  Psychiatric: She has a normal mood and affect. Her behavior is normal.    ED Course  Procedures (including critical care time) Labs Review Labs Reviewed  CBC - Abnormal; Notable for the following:    Hemoglobin 8.4 (*)    HCT 27.8 (*)    MCV 62.1 (*)    MCH 18.8 (*)    RDW 18.9 (*)    All other components within normal limits  BASIC METABOLIC PANEL - Abnormal; Notable for the following:    Sodium 134 (*)    Potassium 3.5 (*)    All other components within normal limits  HEPATIC FUNCTION PANEL  LIPASE, BLOOD  I-STAT TROPOININ, ED    Imaging Review Dg Chest 2 View  03/29/2014   CLINICAL DATA:  Chest pain, shortness of breath.  EXAM: CHEST  2 VIEW  COMPARISON:  DG CHEST 2 VIEW dated 10/22/2013  FINDINGS: Normal cardiac and mediastinal contours. No consolidative pulmonary opacities. No pleural effusion pneumothorax. Right shoulder arthroplasty changes.  IMPRESSION: No acute cardiopulmonary process.   Electronically Signed   By: Lovey Newcomer M.D.   On: 03/29/2014 15:41   Ct Chest W Contrast  03/29/2014   CLINICAL DATA:  Nausea, vomiting, chest pain.  EXAM: CT ANGIOGRAPHY CHEST, ABDOMEN AND PELVIS  TECHNIQUE: Multidetector CT imaging through the chest, abdomen and pelvis was performed using the standard protocol during bolus administration of intravenous contrast. Multiplanar reconstructed images were obtained and reviewed to evaluate the vascular anatomy.  CONTRAST:  183mL OMNIPAQUE IOHEXOL 300 MG/ML  SOLN  COMPARISON:  DG CHEST 2 VIEW dated 03/29/2014  FINDINGS: CT CHEST FINDINGS  Heart  and pericardium are unremarkable, no right heart strain. Thoracic aorta is normal course and caliber, unremarkable. No lymphadenopathy by CT size criteria. Subcentimeter axillary lymph nodes are likely reactive.  Tracheobronchial tree is patent, no pneumothorax. No pleural effusions, focal consolidations, pulmonary nodules or masses.  Status post right humeral arthroplasty. Mildly heterogeneous and possibly enlarged thyroid gland, isthmus is 13 mm in AP dimension.  CT ABDOMEN AND PELVIS FINDINGS  The liver, spleen, gallbladder, pancreas and adrenal glands are unremarkable.  The stomach, small and large bowel are normal in course and caliber without inflammatory changes. Enteric contrast has not yet reached the distal small bowel. Normal appendix. No intraperitoneal free fluid nor free air.  Kidneys are orthotopic, demonstrating symmetric enhancement without nephrolithiasis, hydronephrosis or solid renal masses. 18 mm simple cyst in lower pole right kidney associated with focal scarring. The unopacified ureters are normal in course and caliber. Delayed imaging through the kidneys demonstrates symmetric prompt  excretion to the proximal urinary collecting system. Urinary bladder is partially distended and unremarkable. Phleboliths in the pelvis, with no convincing evidence of urolithiasis.  Aortoiliac vessels are normal in course and caliber. No lymphadenopathy by CT size criteria. Internal reproductive organs are normal; 26 mm right adnexal cyst is immediately above the uterus. The soft tissues and included osseous structures are nonsuspicious. Minimal diastases of the rectus abdominis muscles.  IMPRESSION: CT chest:  No acute cardiopulmonary process.  CT abdomen and pelvis:  No acute intra-abdominal or pelvic process.   Electronically Signed   By: Elon Alas   On: 03/29/2014 23:34   Ct Abdomen Pelvis W Contrast  03/29/2014   CLINICAL DATA:  Nausea, vomiting, chest pain.  EXAM: CT ANGIOGRAPHY CHEST, ABDOMEN  AND PELVIS  TECHNIQUE: Multidetector CT imaging through the chest, abdomen and pelvis was performed using the standard protocol during bolus administration of intravenous contrast. Multiplanar reconstructed images were obtained and reviewed to evaluate the vascular anatomy.  CONTRAST:  182mL OMNIPAQUE IOHEXOL 300 MG/ML  SOLN  COMPARISON:  DG CHEST 2 VIEW dated 03/29/2014  FINDINGS: CT CHEST FINDINGS  Heart and pericardium are unremarkable, no right heart strain. Thoracic aorta is normal course and caliber, unremarkable. No lymphadenopathy by CT size criteria. Subcentimeter axillary lymph nodes are likely reactive.  Tracheobronchial tree is patent, no pneumothorax. No pleural effusions, focal consolidations, pulmonary nodules or masses.  Status post right humeral arthroplasty. Mildly heterogeneous and possibly enlarged thyroid gland, isthmus is 13 mm in AP dimension.  CT ABDOMEN AND PELVIS FINDINGS  The liver, spleen, gallbladder, pancreas and adrenal glands are unremarkable.  The stomach, small and large bowel are normal in course and caliber without inflammatory changes. Enteric contrast has not yet reached the distal small bowel. Normal appendix. No intraperitoneal free fluid nor free air.  Kidneys are orthotopic, demonstrating symmetric enhancement without nephrolithiasis, hydronephrosis or solid renal masses. 18 mm simple cyst in lower pole right kidney associated with focal scarring. The unopacified ureters are normal in course and caliber. Delayed imaging through the kidneys demonstrates symmetric prompt excretion to the proximal urinary collecting system. Urinary bladder is partially distended and unremarkable. Phleboliths in the pelvis, with no convincing evidence of urolithiasis.  Aortoiliac vessels are normal in course and caliber. No lymphadenopathy by CT size criteria. Internal reproductive organs are normal; 26 mm right adnexal cyst is immediately above the uterus. The soft tissues and included osseous  structures are nonsuspicious. Minimal diastases of the rectus abdominis muscles.  IMPRESSION: CT chest:  No acute cardiopulmonary process.  CT abdomen and pelvis:  No acute intra-abdominal or pelvic process.   Electronically Signed   By: Elon Alas   On: 03/29/2014 23:34   US Abdomen Limited  03/29/2014   CLINICAL DATA:  Right upper quadrant pain.  EXAM: US ABDOMEN LIMITED - RIGHT UPPER QUADRANT  COMPARISON:  CT ABD/PELVIS W CM dated 05/19/2013  FINDINGS: Gallbladder:  No gallstones or wall thickening visualized. No sonographic Murphy sign noted.  Common bile duct:  Diameter: 2.9 mm  Liver:  No focal lesion identified. Within normal limits in parenchymal echogenicity.  IMPRESSION: No cholelithiasis or sonographic evidence for acute cholecystitis.   Electronically Signed   By: Lovey Newcomer M.D.   On: 03/29/2014 18:33     EKG Interpretation None      MDM   Final diagnoses:  Chest pain  Nausea   Patient presenting with chest pain and nausea. She is well appearing and in no apparent distress. Afebrile, stable  vital signs. Labs obtained in triage prior to patient being seen, at baseline. Chest x-ray clear. EKG normal. Patient has tenderness in her right upper quadrant and midepigastric area with guarding. Will check LFTs and obtain abdominal US. GI cocktail and zofran ordered. 7:31 PM Patient only had temporary relief with GI cocktail. Morphine given and patient is still slightly uncomfortable. Abdominal ultrasound without any acute findings. Will give more pain medication. Plan to obtain CT of chest and abdomen. Possible esophageal irritation. Doubt dissection. Case discussed with attending Dr. Alvino Chapel who also evaluated patient and agrees with plan of care. 11:56 PM CT scans without any acute findings. Pt is resting comfortably on exam bed. Abdomen still tender, however improvement from prior exam. Chest pain improved from initial evaluation. Stable for d/c. Return precautions given. Patient  states understanding of treatment care plan and is agreeable.   Illene Labrador, PA-C 03/29/14 2357

## 2014-03-29 NOTE — ED Notes (Signed)
The pain comes back after the morphine is given

## 2014-03-29 NOTE — ED Notes (Signed)
MD at bedside. 

## 2014-03-29 NOTE — ED Notes (Signed)
Pt reports onset of n/v yesterday and today has a pain she describes as "someone sitting on her chest." airway intact, ekg done at triage.

## 2014-03-29 NOTE — ED Notes (Signed)
The pt returned from c-t.  C/o pain in her chest

## 2014-03-29 NOTE — Discharge Instructions (Signed)
Follow up with one of the resources below to establish care with a primary care doctor.  Chest Pain (Nonspecific) It is often hard to give a specific diagnosis for the cause of chest pain. There is always a chance that your pain could be related to something serious, such as a heart attack or a blood clot in the lungs. You need to follow up with your caregiver for further evaluation. CAUSES   Heartburn.  Pneumonia or bronchitis.  Anxiety or stress.  Inflammation around your heart (pericarditis) or lung (pleuritis or pleurisy).  A blood clot in the lung.  A collapsed lung (pneumothorax). It can develop suddenly on its own (spontaneous pneumothorax) or from injury (trauma) to the chest.  Shingles infection (herpes zoster virus). The chest wall is composed of bones, muscles, and cartilage. Any of these can be the source of the pain.  The bones can be bruised by injury.  The muscles or cartilage can be strained by coughing or overwork.  The cartilage can be affected by inflammation and become sore (costochondritis). DIAGNOSIS  Lab tests or other studies, such as X-rays, electrocardiography, stress testing, or cardiac imaging, may be needed to find the cause of your pain.  TREATMENT   Treatment depends on what may be causing your chest pain. Treatment may include:  Acid blockers for heartburn.  Anti-inflammatory medicine.  Pain medicine for inflammatory conditions.  Antibiotics if an infection is present.  You may be advised to change lifestyle habits. This includes stopping smoking and avoiding alcohol, caffeine, and chocolate.  You may be advised to keep your head raised (elevated) when sleeping. This reduces the chance of acid going backward from your stomach into your esophagus.  Most of the time, nonspecific chest pain will improve within 2 to 3 days with rest and mild pain medicine. HOME CARE INSTRUCTIONS   If antibiotics were prescribed, take your antibiotics as  directed. Finish them even if you start to feel better.  For the next few days, avoid physical activities that bring on chest pain. Continue physical activities as directed.  Do not smoke.  Avoid drinking alcohol.  Only take over-the-counter or prescription medicine for pain, discomfort, or fever as directed by your caregiver.  Follow your caregiver's suggestions for further testing if your chest pain does not go away.  Keep any follow-up appointments you made. If you do not go to an appointment, you could develop lasting (chronic) problems with pain. If there is any problem keeping an appointment, you must call to reschedule. SEEK MEDICAL CARE IF:   You think you are having problems from the medicine you are taking. Read your medicine instructions carefully.  Your chest pain does not go away, even after treatment.  You develop a rash with blisters on your chest. SEEK IMMEDIATE MEDICAL CARE IF:   You have increased chest pain or pain that spreads to your arm, neck, jaw, back, or abdomen.  You develop shortness of breath, an increasing cough, or you are coughing up blood.  You have severe back or abdominal pain, feel nauseous, or vomit.  You develop severe weakness, fainting, or chills.  You have a fever. THIS IS AN EMERGENCY. Do not wait to see if the pain will go away. Get medical help at once. Call your local emergency services (911 in [place] Do not drive yourself to the hospital. MAKE SURE YOU:   Understand these instructions.  Will watch your condition.  Will get help right away if you are not doing well  or get worse. Document Released: 08/10/2005 Document Revised: 01/23/2012 Document Reviewed: 06/05/2008 [facility] Patient Information 2014 [place].  Nausea, Adult Nausea is the feeling that you have an upset stomach or have to vomit. Nausea by itself is not likely a serious concern, but it may be an early sign of more serious medical problems. As nausea gets  worse, it can lead to vomiting. If vomiting develops, there is the risk of dehydration.  CAUSES   Viral infections.  Food poisoning.  Medicines.  Pregnancy.  Motion sickness.  Migraine headaches.  Emotional distress.  Severe pain from any source.  Alcohol intoxication. HOME CARE INSTRUCTIONS  Get plenty of rest.  Ask your caregiver about specific rehydration instructions.  Eat small amounts of food and sip liquids more often.  Take all medicines as told by your caregiver. SEEK MEDICAL CARE IF:  You have not improved after 2 days, or you get worse.  You have a headache. SEEK IMMEDIATE MEDICAL CARE IF:   You have a fever.  You faint.  You keep vomiting or have blood in your vomit.  You are extremely weak or dehydrated.  You have dark or bloody stools.  You have severe chest or abdominal pain. MAKE SURE YOU:  Understand these instructions.  Will watch your condition.  Will get help right away if you are not doing well or get worse. Document Released: 12/08/2004 Document Revised: 07/25/2012 Document Reviewed: 07/13/2011 [facility] Patient Information 2014 [place], [place]. RESOURCE GUIDE  Chronic Pain Problems: Contact [facility]  [phone] Patients need to be referred by their primary care doctor.  Insufficient Money for Medicine: Contact [employer]:  call "211."   No Primary Care Doctor: - Call [facility]  [phone] - can help you locate a primary care doctor that  accepts your insurance, provides certain services, etc. - Physician Referral Service- [phone]  Agencies that provide inexpensive medical care: - [facility]  [facility]  [phone] - [facility]  [phone] - [facility]  [phone] - [facility]  [phone] - [facility]  [phone]  [place] Providers: - [facility]- [address], [address]  [phone], Mon-Fri 9am-7pm, Sat 9am-1pm - [facility]- [place], [address]  [place], [address]  [phone] [facility]- [address]  [place], [address], [phone]  Only accepts [place] patients after they have their name  applied to their card  Self Pay (no insurance) in [place]: - Sickle Cell Patients: Dr Kevan Ny, [facility]  [place], [facility]- [facility]- [address], [facility]- see information above (Speak to [employer] if you do not have insurance)       -  [facility],  [address], [phone]       -  Dr Vista Lawman-  [address], [place], [place], [phone]       -  [facility] - [address] [phone]       -  [facility]- [address], [place], also [address], [phone]       -    [facility]- [address], [place], 1st & 3rd Saturday        every month, 10am-1pm  1) Find a Doctor and Pay Out of Pocket Although you won't have to find out who is covered by your insurance plan, it is a good idea to ask around and get recommendations. You will then need to call the office and see if the doctor you have chosen will accept you as a new patient and what types of options they offer for patients who are self-pay. Some doctors offer discounts or will set up payment plans for their patients who do not have insurance, but you will need to ask so you aren't surprised when you get to your appointment.  2) Contact Your Local Health Department Not all health departments have doctors that can see patients for  sick visits, but many do, so it is worth a call to see if yours does. If you don't know where your local health department is, you can check in your phone book. The CDC also has a tool to help you locate your state's health department, and many state websites also have listings of all of their local health departments.  3) Find a [facility] If your illness is not likely to be very severe or complicated, you may want to try a walk in clinic. These are popping up all over the country in pharmacies, drugstores, and shopping centers. They're usually staffed by nurse practitioners or physician assistants that have been trained to treat common illnesses and complaints. They're usually fairly quick and inexpensive. However, if you have serious medical issues or chronic medical problems, these are probably not your best option

## 2014-03-29 NOTE — ED Notes (Signed)
Autumn Gordon in c-t called asked for a preg test before the c-t.  lmp 5-1

## 2014-03-31 NOTE — ED Provider Notes (Signed)
Medical screening examination/treatment/procedure(s) were performed by non-physician practitioner and as supervising physician I was immediately available for consultation/collaboration.   EKG Interpretation None       Jasper Riling. Alvino Chapel, MD 03/31/14 1093

## 2014-05-31 ENCOUNTER — Emergency Department: Payer: Self-pay | Admitting: Emergency Medicine

## 2014-09-15 ENCOUNTER — Encounter (HOSPITAL_COMMUNITY): Payer: Self-pay | Admitting: Emergency Medicine

## 2014-10-16 ENCOUNTER — Ambulatory Visit
Admission: RE | Admit: 2014-10-16 | Discharge: 2014-10-16 | Disposition: A | Discharge status: Home / Self Care | Payer: 59 | Source: Ambulatory Visit | Attending: Physician Assistant | Admitting: Physician Assistant

## 2014-10-16 ENCOUNTER — Other Ambulatory Visit: Payer: Self-pay | Admitting: Physician Assistant

## 2014-10-16 DIAGNOSIS — R52 Pain, unspecified: Secondary | ICD-10-CM

## 2014-12-17 ENCOUNTER — Emergency Department (HOSPITAL_COMMUNITY): Payer: Managed Care, Other (non HMO)

## 2014-12-17 ENCOUNTER — Encounter (HOSPITAL_COMMUNITY): Payer: Self-pay | Admitting: Family Medicine

## 2014-12-17 ENCOUNTER — Emergency Department (HOSPITAL_COMMUNITY)
Admission: EM | Admit: 2014-12-17 | Discharge: 2014-12-17 | Disposition: A | Discharge status: Home / Self Care | Payer: Managed Care, Other (non HMO) | Attending: Emergency Medicine | Admitting: Emergency Medicine

## 2014-12-17 DIAGNOSIS — Z720 Tobacco use: Secondary | ICD-10-CM | POA: Diagnosis not present

## 2014-12-17 DIAGNOSIS — S3992XA Unspecified injury of lower back, initial encounter: Secondary | ICD-10-CM | POA: Diagnosis present

## 2014-12-17 DIAGNOSIS — Y9389 Activity, other specified: Secondary | ICD-10-CM | POA: Insufficient documentation

## 2014-12-17 DIAGNOSIS — I10 Essential (primary) hypertension: Secondary | ICD-10-CM | POA: Diagnosis not present

## 2014-12-17 DIAGNOSIS — Y998 Other external cause status: Secondary | ICD-10-CM | POA: Diagnosis not present

## 2014-12-17 DIAGNOSIS — Z8542 Personal history of malignant neoplasm of other parts of uterus: Secondary | ICD-10-CM | POA: Diagnosis not present

## 2014-12-17 DIAGNOSIS — J449 Chronic obstructive pulmonary disease, unspecified: Secondary | ICD-10-CM | POA: Diagnosis not present

## 2014-12-17 DIAGNOSIS — Y9289 Other specified places as the place of occurrence of the external cause: Secondary | ICD-10-CM | POA: Diagnosis not present

## 2014-12-17 DIAGNOSIS — W108XXA Fall (on) (from) other stairs and steps, initial encounter: Secondary | ICD-10-CM | POA: Diagnosis not present

## 2014-12-17 DIAGNOSIS — Z8739 Personal history of other diseases of the musculoskeletal system and connective tissue: Secondary | ICD-10-CM | POA: Diagnosis not present

## 2014-12-17 DIAGNOSIS — Z862 Personal history of diseases of the blood and blood-forming organs and certain disorders involving the immune mechanism: Secondary | ICD-10-CM | POA: Diagnosis not present

## 2014-12-17 DIAGNOSIS — Z8669 Personal history of other diseases of the nervous system and sense organs: Secondary | ICD-10-CM | POA: Insufficient documentation

## 2014-12-17 MED ORDER — HYDROCODONE-ACETAMINOPHEN 5-325 MG PO TABS: 1.0000 | ORAL_TABLET | Freq: Four times a day (QID) | ORAL | Status: DC | PRN

## 2014-12-17 MED ORDER — CYCLOBENZAPRINE HCL 10 MG PO TABS: 10.0000 mg | ORAL_TABLET | Freq: Two times a day (BID) | ORAL | Status: DC | PRN

## 2014-12-17 MED ORDER — IBUPROFEN 600 MG PO TABS: 600.0000 mg | ORAL_TABLET | Freq: Four times a day (QID) | ORAL | Status: DC | PRN

## 2014-12-17 MED ORDER — OXYCODONE-ACETAMINOPHEN 5-325 MG PO TABS
2.0000 | ORAL_TABLET | Freq: Once | ORAL | Status: AC
  Administered 2014-12-17: 2 via ORAL
  Filled 2014-12-17: qty 2

## 2014-12-17 NOTE — ED Notes (Signed)
Pt sts slipped and fell on some stairs this am and injured lower back.

## 2014-12-17 NOTE — Discharge Instructions (Signed)
Ibuprofen for pain. Norco for severe pain. Flexeril for spasms. Rest. Follow up with your doctor if not improving in 2-3 days.   Lumbosacral Strain Lumbosacral strain is a strain of any of the parts that make up your lumbosacral vertebrae. Your lumbosacral vertebrae are the bones that make up the lower third of your backbone. Your lumbosacral vertebrae are held together by muscles and tough, fibrous tissue (ligaments).  CAUSES  A sudden blow to your back can cause lumbosacral strain. Also, anything that causes an excessive stretch of the muscles in the low back can cause this strain. This is typically seen when people exert themselves strenuously, fall, lift heavy objects, bend, or crouch repeatedly. RISK FACTORS  Physically demanding work.  Participation in pushing or pulling sports or sports that require a sudden twist of the back (tennis, golf, baseball).  Weight lifting.  Excessive lower back curvature.  Forward-tilted pelvis.  Weak back or abdominal muscles or both.  Tight hamstrings. SIGNS AND SYMPTOMS  Lumbosacral strain may cause pain in the area of your injury or pain that moves (radiates) down your leg.  DIAGNOSIS Your health care provider can often diagnose lumbosacral strain through a physical exam. In some cases, you may need tests such as X-ray exams.  TREATMENT  Treatment for your lower back injury depends on many factors that your clinician will have to evaluate. However, most treatment will include the use of anti-inflammatory medicines. HOME CARE INSTRUCTIONS   Avoid hard physical activities (tennis, racquetball, waterskiing) if you are not in proper physical condition for it. This may aggravate or create problems.  If you have a back problem, avoid sports requiring sudden body movements. Swimming and walking are generally safer activities.  Maintain good posture.  Maintain a healthy weight.  For acute conditions, you may put ice on the injured area.  Put ice  in a plastic bag.  Place a towel between your skin and the bag.  Leave the ice on for 20 minutes, 2-3 times a day.  When the low back starts healing, stretching and strengthening exercises may be recommended. SEEK MEDICAL CARE IF:  Your back pain is getting worse.  You experience severe back pain not relieved with medicines. SEEK IMMEDIATE MEDICAL CARE IF:   You have numbness, tingling, weakness, or problems with the use of your arms or legs.  There is a change in bowel or bladder control.  You have increasing pain in any area of the body, including your belly (abdomen).  You notice shortness of breath, dizziness, or feel faint.  You feel sick to your stomach (nauseous), are throwing up (vomiting), or become sweaty.  You notice discoloration of your toes or legs, or your feet get very cold. MAKE SURE YOU:   Understand these instructions.  Will watch your condition.  Will get help right away if you are not doing well or get worse. Document Released: 08/10/2005 Document Revised: 11/05/2013 Document Reviewed: 06/19/2013 [facility] Patient Information 2015 [place], [place]. This information is not intended to replace advice given to you by your health care provider. Make sure you discuss any questions you have with your health care provider.
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
## 2015-03-12 ENCOUNTER — Emergency Department (HOSPITAL_COMMUNITY): Payer: Managed Care, Other (non HMO)

## 2015-03-12 ENCOUNTER — Inpatient Hospital Stay (HOSPITAL_COMMUNITY)
Admission: EM | Admit: 2015-03-12 | Discharge: 2015-03-16 | DRG: 812 | Disposition: A | Discharge status: Home / Self Care | Payer: Managed Care, Other (non HMO) | Attending: Internal Medicine | Admitting: Internal Medicine

## 2015-03-12 ENCOUNTER — Encounter (HOSPITAL_COMMUNITY): Payer: Self-pay | Admitting: *Deleted

## 2015-03-12 DIAGNOSIS — R718 Other abnormality of red blood cells: Secondary | ICD-10-CM

## 2015-03-12 DIAGNOSIS — Z91013 Allergy to seafood: Secondary | ICD-10-CM | POA: Diagnosis not present

## 2015-03-12 DIAGNOSIS — Z9289 Personal history of other medical treatment: Secondary | ICD-10-CM

## 2015-03-12 DIAGNOSIS — J45909 Unspecified asthma, uncomplicated: Secondary | ICD-10-CM | POA: Diagnosis present

## 2015-03-12 DIAGNOSIS — Z8542 Personal history of malignant neoplasm of other parts of uterus: Secondary | ICD-10-CM

## 2015-03-12 DIAGNOSIS — I10 Essential (primary) hypertension: Secondary | ICD-10-CM | POA: Diagnosis present

## 2015-03-12 DIAGNOSIS — F17210 Nicotine dependence, cigarettes, uncomplicated: Secondary | ICD-10-CM | POA: Diagnosis present

## 2015-03-12 DIAGNOSIS — R102 Pelvic and perineal pain: Secondary | ICD-10-CM | POA: Diagnosis present

## 2015-03-12 DIAGNOSIS — R1084 Generalized abdominal pain: Secondary | ICD-10-CM | POA: Diagnosis present

## 2015-03-12 DIAGNOSIS — G4733 Obstructive sleep apnea (adult) (pediatric): Secondary | ICD-10-CM | POA: Diagnosis present

## 2015-03-12 DIAGNOSIS — Z888 Allergy status to other drugs, medicaments and biological substances status: Secondary | ICD-10-CM | POA: Diagnosis not present

## 2015-03-12 DIAGNOSIS — D509 Iron deficiency anemia, unspecified: Secondary | ICD-10-CM | POA: Diagnosis present

## 2015-03-12 DIAGNOSIS — R109 Unspecified abdominal pain: Secondary | ICD-10-CM | POA: Diagnosis not present

## 2015-03-12 DIAGNOSIS — Z8249 Family history of ischemic heart disease and other diseases of the circulatory system: Secondary | ICD-10-CM

## 2015-03-12 DIAGNOSIS — R938 Abnormal findings on diagnostic imaging of other specified body structures: Secondary | ICD-10-CM | POA: Diagnosis not present

## 2015-03-12 DIAGNOSIS — Z96619 Presence of unspecified artificial shoulder joint: Secondary | ICD-10-CM | POA: Diagnosis present

## 2015-03-12 DIAGNOSIS — R52 Pain, unspecified: Secondary | ICD-10-CM

## 2015-03-12 DIAGNOSIS — M797 Fibromyalgia: Secondary | ICD-10-CM | POA: Diagnosis present

## 2015-03-12 DIAGNOSIS — D500 Iron deficiency anemia secondary to blood loss (chronic): Secondary | ICD-10-CM | POA: Diagnosis not present

## 2015-03-12 DIAGNOSIS — R9389 Abnormal findings on diagnostic imaging of other specified body structures: Secondary | ICD-10-CM | POA: Diagnosis present

## 2015-03-12 DIAGNOSIS — J449 Chronic obstructive pulmonary disease, unspecified: Secondary | ICD-10-CM | POA: Diagnosis present

## 2015-03-12 DIAGNOSIS — D649 Anemia, unspecified: Secondary | ICD-10-CM

## 2015-03-12 DIAGNOSIS — N920 Excessive and frequent menstruation with regular cycle: Secondary | ICD-10-CM | POA: Diagnosis present

## 2015-03-12 HISTORY — DX: Depression, unspecified: F32.A

## 2015-03-12 HISTORY — DX: Reserved for inherently not codable concepts without codable children: IMO0001

## 2015-03-12 HISTORY — DX: Malignant neoplasm of uterus, part unspecified: C55

## 2015-03-12 HISTORY — DX: Personal history of other medical treatment: Z92.89

## 2015-03-12 HISTORY — DX: Low back pain, unspecified: M54.50

## 2015-03-12 HISTORY — DX: Gastro-esophageal reflux disease without esophagitis: K21.9

## 2015-03-12 HISTORY — DX: Other chronic pain: G89.29

## 2015-03-12 HISTORY — DX: Anxiety disorder, unspecified: F41.9

## 2015-03-12 HISTORY — DX: Iron deficiency anemia, unspecified: D50.9

## 2015-03-12 HISTORY — DX: Low back pain: M54.5

## 2015-03-12 HISTORY — DX: Major depressive disorder, single episode, unspecified: F32.9

## 2015-03-12 HISTORY — DX: Obstructive sleep apnea (adult) (pediatric): G47.33

## 2015-03-12 LAB — COMPREHENSIVE METABOLIC PANEL
ALK PHOS: 64 U/L (ref 39–117)
ALT: 13 U/L (ref 0–35)
AST: 21 U/L (ref 0–37)
Albumin: 3.9 g/dL (ref 3.5–5.2)
Anion gap: 8 (ref 5–15)
BILIRUBIN TOTAL: 0.3 mg/dL (ref 0.3–1.2)
BUN: <5 mg/dL — ABNORMAL LOW (ref 6–23)
CHLORIDE: 105 mmol/L (ref 96–112)
CO2: 24 mmol/L (ref 19–32)
Calcium: 9.1 mg/dL (ref 8.4–10.5)
Creatinine, Ser: 0.63 mg/dL (ref 0.50–1.10)
GFR calc Af Amer: >90 mL/min (ref >90)
Glucose, Bld: 86 mg/dL (ref 70–99)
POTASSIUM: 3.8 mmol/L (ref 3.5–5.1)
SODIUM: 137 mmol/L (ref 135–145)
Total Protein: 7.6 g/dL (ref 6.0–8.3)

## 2015-03-12 LAB — PROTIME-INR
INR: 1.07 (ref 0.00–1.49)
Prothrombin Time: 14 seconds (ref 11.6–15.2)

## 2015-03-12 LAB — CBC
HCT: 24.2 % — ABNORMAL LOW (ref 36.0–46.0)
Hemoglobin: 7 g/dL — ABNORMAL LOW (ref 12.0–15.0)
MCH: 17.1 pg — AB (ref 26.0–34.0)
MCHC: 28.9 g/dL — AB (ref 30.0–36.0)
MCV: 59 fL — ABNORMAL LOW (ref 78.0–100.0)
Platelets: 495 10*3/uL — ABNORMAL HIGH (ref 150–400)
RBC: 4.1 MIL/uL (ref 3.87–5.11)
RDW: 19.1 % — ABNORMAL HIGH (ref 11.5–15.5)
WBC: 5.4 10*3/uL (ref 4.0–10.5)

## 2015-03-12 LAB — CBC WITH DIFFERENTIAL/PLATELET
BASOS PCT: 0 % (ref 0–1)
Basophils Absolute: 0 10*3/uL (ref 0.0–0.1)
EOS PCT: 3 % (ref 0–5)
Eosinophils Absolute: 0.2 10*3/uL (ref 0.0–0.7)
HCT: 25.2 % — ABNORMAL LOW (ref 36.0–46.0)
Hemoglobin: 7.2 g/dL — ABNORMAL LOW (ref 12.0–15.0)
LYMPHS ABS: 2.1 10*3/uL (ref 0.7–4.0)
LYMPHS PCT: 40 % (ref 12–46)
MCH: 16.9 pg — ABNORMAL LOW (ref 26.0–34.0)
MCHC: 28.6 g/dL — AB (ref 30.0–36.0)
MCV: 59.2 fL — ABNORMAL LOW (ref 78.0–100.0)
MONOS PCT: 10 % (ref 3–12)
Monocytes Absolute: 0.5 10*3/uL (ref 0.1–1.0)
NEUTROS ABS: 2.4 10*3/uL (ref 1.7–7.7)
Neutrophils Relative %: 47 % (ref 43–77)
Platelets: 511 10*3/uL — ABNORMAL HIGH (ref 150–400)
RBC: 4.26 MIL/uL (ref 3.87–5.11)
RDW: 18.9 % — ABNORMAL HIGH (ref 11.5–15.5)
WBC: 5.2 10*3/uL (ref 4.0–10.5)

## 2015-03-12 LAB — BRAIN NATRIURETIC PEPTIDE: B Natriuretic Peptide: 16.7 pg/mL (ref 0.0–100.0)

## 2015-03-12 LAB — SAMPLE TO BLOOD BANK

## 2015-03-12 LAB — APTT: aPTT: 23 seconds — ABNORMAL LOW (ref 24–37)

## 2015-03-12 LAB — WET PREP, GENITAL
Trich, Wet Prep: NONE SEEN
Yeast Wet Prep HPF POC: NONE SEEN

## 2015-03-12 LAB — MAGNESIUM: Magnesium: 2.1 mg/dL (ref 1.5–2.5)

## 2015-03-12 LAB — I-STAT BETA HCG BLOOD, ED (MC, WL, AP ONLY): I-stat hCG, quantitative: <5 m[IU]/mL (ref <5)

## 2015-03-12 LAB — PREPARE RBC (CROSSMATCH)

## 2015-03-12 LAB — LACTATE DEHYDROGENASE: LDH: 158 U/L (ref 94–250)

## 2015-03-12 LAB — ABO/RH: ABO/RH(D): B POS

## 2015-03-12 LAB — RETICULOCYTES
RBC.: 4.1 MIL/uL (ref 3.87–5.11)
RETIC COUNT ABSOLUTE: 49.2 10*3/uL (ref 19.0–186.0)
Retic Ct Pct: 1.2 % (ref 0.4–3.1)

## 2015-03-12 LAB — POC OCCULT BLOOD, ED: Fecal Occult Bld: NEGATIVE

## 2015-03-12 LAB — TROPONIN I: Troponin I: <0.03 ng/mL (ref <0.031)

## 2015-03-12 MED ORDER — OXYCODONE HCL 5 MG PO TABS
5.0000 mg | ORAL_TABLET | ORAL | Status: DC | PRN
  Administered 2015-03-12 – 2015-03-14 (×8): 5 mg via ORAL
  Filled 2015-03-12 (×9): qty 1

## 2015-03-12 MED ORDER — KETOROLAC TROMETHAMINE 30 MG/ML IJ SOLN
30.0000 mg | Freq: Four times a day (QID) | INTRAMUSCULAR | Status: DC | PRN
Start: 2015-03-12 — End: 2015-03-13
  Administered 2015-03-12: 30 mg via INTRAVENOUS
  Filled 2015-03-12: qty 1

## 2015-03-12 MED ORDER — PANTOPRAZOLE SODIUM 40 MG IV SOLR
40.0000 mg | INTRAVENOUS | Status: DC
  Administered 2015-03-12: 40 mg via INTRAVENOUS
  Filled 2015-03-12 (×2): qty 40

## 2015-03-12 MED ORDER — IOHEXOL 300 MG/ML  SOLN
100.0000 mL | Freq: Once | INTRAMUSCULAR | Status: AC | PRN
  Administered 2015-03-12: 100 mL via INTRAVENOUS

## 2015-03-12 MED ORDER — ONDANSETRON HCL 4 MG PO TABS
4.0000 mg | ORAL_TABLET | Freq: Four times a day (QID) | ORAL | Status: DC | PRN
  Administered 2015-03-13: 4 mg via ORAL
  Filled 2015-03-12: qty 1

## 2015-03-12 MED ORDER — ONDANSETRON HCL 4 MG/2ML IJ SOLN
4.0000 mg | Freq: Four times a day (QID) | INTRAMUSCULAR | Status: DC | PRN
  Administered 2015-03-15 – 2015-03-16 (×2): 4 mg via INTRAVENOUS
  Filled 2015-03-12 (×2): qty 2

## 2015-03-12 MED ORDER — DIPHENHYDRAMINE HCL 25 MG PO CAPS
25.0000 mg | ORAL_CAPSULE | Freq: Four times a day (QID) | ORAL | Status: DC | PRN
Start: 2015-03-12 — End: 2015-03-16
  Administered 2015-03-13 – 2015-03-15 (×3): 25 mg via ORAL
  Filled 2015-03-12 (×5): qty 1

## 2015-03-12 MED ORDER — HYDROMORPHONE HCL 1 MG/ML IJ SOLN
1.0000 mg | Freq: Once | INTRAMUSCULAR | Status: AC
  Administered 2015-03-12: 1 mg via INTRAVENOUS
  Filled 2015-03-12: qty 1

## 2015-03-12 MED ORDER — ACETAMINOPHEN 325 MG PO TABS
650.0000 mg | ORAL_TABLET | Freq: Once | ORAL | Status: DC
  Filled 2015-03-12: qty 2

## 2015-03-12 MED ORDER — IOHEXOL 300 MG/ML  SOLN
25.0000 mL | INTRAMUSCULAR | Status: AC
  Administered 2015-03-12: 25 mL via ORAL

## 2015-03-12 MED ORDER — ACETAMINOPHEN 650 MG RE SUPP: 650.0000 mg | Freq: Four times a day (QID) | RECTAL | Status: DC | PRN

## 2015-03-12 MED ORDER — SODIUM CHLORIDE 0.9 % IJ SOLN
3.0000 mL | Freq: Two times a day (BID) | INTRAMUSCULAR | Status: DC
  Administered 2015-03-13 – 2015-03-16 (×7): 3 mL via INTRAVENOUS

## 2015-03-12 MED ORDER — HYDROMORPHONE HCL 1 MG/ML IJ SOLN: 1.0000 mg | Freq: Once | INTRAMUSCULAR | Status: DC

## 2015-03-12 MED ORDER — ONDANSETRON HCL 4 MG/2ML IJ SOLN
4.0000 mg | Freq: Once | INTRAMUSCULAR | Status: AC
  Administered 2015-03-12: 4 mg via INTRAVENOUS
  Filled 2015-03-12: qty 2

## 2015-03-12 MED ORDER — SODIUM CHLORIDE 0.9 % IV SOLN
Freq: Once | INTRAVENOUS | Status: AC
  Administered 2015-03-12: 22:00:00 via INTRAVENOUS

## 2015-03-12 MED ORDER — ACETAMINOPHEN 325 MG PO TABS
650.0000 mg | ORAL_TABLET | Freq: Four times a day (QID) | ORAL | Status: DC | PRN
  Administered 2015-03-13: 650 mg via ORAL
  Filled 2015-03-12: qty 2

## 2015-03-12 NOTE — ED Notes (Signed)
Pt reports increased, Erin PA notified

## 2015-03-12 NOTE — ED Notes (Signed)
Pt states sent here from her pcp's for "possible transfusion".  Pt hx of bil LE swelling, abdominal swelling, sob when supine and intermittent chest pain.  Also c/o diffuse abdominal pain and decreased appetite.  Hx of endometrial ca that she states she "never followed up on".

## 2015-03-12 NOTE — ED Provider Notes (Signed)
CSN: [id number]     Arrival date & time 03/12/15  1239 History   First MD Initiated Contact with Patient 03/12/15 1515     Chief Complaint  Patient presents with  . Leg Swelling  . Abdominal Pain     (Consider location/radiation/quality/duration/timing/severity/associated sxs/prior Treatment) HPI  Pt is a 43yo female with hx of fibromyalgia, anemia-managed by PO iron pills (has never required transfusion), endometrial cancer dx in [place] after developing heavy menstraul cycles- pt states she has not f/u on this since initial dx, migraines, COPD, asthma, sleep apnea and HTN, sent to ED by PCP for further evaluation of bilateral leg swelling, abdominal pain and distension with nausea but no vomiting, SOB when supine and intermittent chest pain.  PCP was concerned pt may need a transfusion.  Pt is c/o diffuse aching, sharp, and cramping abdominal pain, associated decreased appetite for 4 days with a weight gain of about 10 pounds since symptom onset 4 days ago.  Pt states she becomes full quickly, and would not be able to finish a "Mcdouble" due to feeling full quickly.  Denies fever, vomiting or diarrhea. Denies urinary or vaginal symptoms.  No hx of abdominal surgeries. States this SOB feels different than her COPD. State her abdomen feels "full" like she is 5 months pregnant as she reports having 9 children and "knows how that feels."  Pt is not concerned for pregnancy at this time.     PCP: [facility]. Dr. Marisue Humble   Past Medical History  Diagnosis Date  . Fibromyalgia   . Anemia 05/31/2012  . Endometrial cancer   . Migraine   . COPD (chronic obstructive pulmonary disease)   . Sleep apnea   . Asthma   . Hypertension    Past Surgical History  Procedure Laterality Date  . Total shoulder replacement     Family History  Problem Relation Age of Onset  . Coronary artery disease Father   . Heart attack Father   . Hypertension Other   . Diabetes Other   . Cancer Other     History  Substance Use Topics  . Smoking status: Current Some Day Smoker -- 0.50 packs/day for 10 years    Types: Cigarettes  . Smokeless tobacco: Never Used  . Alcohol Use: No   OB History    Gravida Para Term Preterm AB TAB SAB Ectopic Multiple Living   10 9 2 7 1 1    9      Review of Systems  Constitutional: Positive for appetite change and unexpected weight change ( 10lb increase in 4 days). Negative for fever, chills, diaphoresis and fatigue.  Respiratory: Positive for shortness of breath. Negative for cough.   Cardiovascular: Positive for chest pain and leg swelling ( bilateral feet). Negative for palpitations.  Gastrointestinal: Positive for nausea and abdominal pain ( "fullness, pressure"). Negative for vomiting, diarrhea, constipation and blood in stool.  Genitourinary: Positive for menstrual problem ( heavy cycles) and pelvic pain. Negative for dysuria, urgency, hematuria, flank pain, decreased urine volume, vaginal bleeding, vaginal discharge and vaginal pain.  Musculoskeletal: Positive for myalgias (leg, hand, and back cramps) and back pain. Negative for arthralgias.  Skin: Negative for color change, pallor, rash and wound.  Neurological: Positive for dizziness. Negative for syncope, weakness, light-headedness, numbness and headaches.  All other systems reviewed and are negative.     Allergies  Shellfish allergy and Tramadol  Home Medications   Prior to Admission medications   Medication Sig Start Date End Date  Taking? Authorizing Provider  diphenhydrAMINE (BENADRYL) 25 MG tablet Take 25 mg by mouth every 6 (six) hours as needed for allergies or sleep.   Yes Historical Provider, MD  hydrocortisone cream 1 % Apply 1 application topically as needed for itching (for face).   Yes Historical Provider, MD  ibuprofen (ADVIL,MOTRIN) 200 MG tablet Take 400 mg by mouth every 6 (six) hours as needed for moderate pain.   Yes Historical Provider, MD  Iron TABS Take 1 tablet by  mouth once a week.   Yes Historical Provider, MD  cyclobenzaprine (FLEXERIL) 10 MG tablet Take 1 tablet (10 mg total) by mouth 2 (two) times daily as needed for muscle spasms. 12/17/14   Tatyana Kirichenko, PA-C  HYDROcodone-acetaminophen (NORCO) 5-325 MG per tablet Take 1 tablet by mouth every 6 (six) hours as needed for moderate pain. 12/17/14   Tatyana Kirichenko, PA-C   BP 128/69 mmHg  Pulse 70  Temp(Src) 98.3 F (36.8 C) (Oral)  Resp 16  Ht 5\' 4"  (1.626 m)  Wt 176 lb (79.833 kg)  BMI 30.20 kg/m2  SpO2 100%  LMP 02/15/2015 Physical Exam  Constitutional: She is oriented to person, place, and time. She appears well-developed and well-nourished. She appears distressed.  Appears uncomfortable lying in exam bed  HENT:  Head: Normocephalic and atraumatic.  Eyes: Conjunctivae and EOM are normal. Pupils are equal, round, and reactive to light. No scleral icterus.  Neck: Normal range of motion. Neck supple.  Cardiovascular: Normal rate, regular rhythm and normal heart sounds.   Pulmonary/Chest: Effort normal and breath sounds normal. No respiratory distress. She has no wheezes. She has no rales. She exhibits no tenderness.  Abdominal: Soft. Bowel sounds are normal. She exhibits distension. She exhibits no mass. There is tenderness. There is no rebound and no guarding.  Genitourinary: Guaiac negative stool. No labial fusion. There is no rash, tenderness, lesion or injury on the right labia. There is no rash, tenderness, lesion or injury on the left labia. Cervix exhibits discharge. Cervix exhibits no motion tenderness and no friability. Right adnexum displays no mass, no tenderness and no fullness. Left adnexum displays no mass, no tenderness and no fullness. No bleeding in the vagina. Vaginal discharge found.  Chaperoned exam. Normal external genitalia. Vaginal discharge-moderate about of clear-white discharge. No vaginal bleeding. No CMT, adnexal mass or tenderness.   Musculoskeletal: Normal range  of motion. She exhibits no edema or tenderness.  Neurological: She is alert and oriented to person, place, and time. No cranial nerve deficit. Coordination normal.  Skin: Skin is warm and dry. She is not diaphoretic.  Nursing note and vitals reviewed.   ED Course  Procedures (including critical care time) Labs Review Labs Reviewed  WET PREP, GENITAL - Abnormal; Notable for the following:    Clue Cells Wet Prep HPF POC FEW (*)    WBC, Wet Prep HPF POC FEW (*)    All other components within normal limits  COMPREHENSIVE METABOLIC PANEL - Abnormal; Notable for the following:    BUN <5 (*)    All other components within normal limits  CBC WITH DIFFERENTIAL/PLATELET - Abnormal; Notable for the following:    Hemoglobin 7.2 (*)    HCT 25.2 (*)    MCV 59.2 (*)    MCH 16.9 (*)    MCHC 28.6 (*)    RDW 18.9 (*)    Platelets 511 (*)    All other components within normal limits  CBC - Abnormal; Notable for the following:  Hemoglobin 7.0 (*)    HCT 24.2 (*)    MCV 59.0 (*)    MCH 17.1 (*)    MCHC 28.9 (*)    RDW 19.1 (*)    Platelets 495 (*)    All other components within normal limits  APTT - Abnormal; Notable for the following:    aPTT 23 (*)    All other components within normal limits  TROPONIN I  BRAIN NATRIURETIC PEPTIDE  PROTIME-INR  RETICULOCYTES  LACTATE DEHYDROGENASE  MAGNESIUM  LACTIC ACID, PLASMA  IRON AND TIBC  FERRITIN  COMPREHENSIVE METABOLIC PANEL  CBC  PROTIME-INR  POC OCCULT BLOOD, ED  I-STAT BETA HCG BLOOD, ED ([facility], [facility], [facility] ONLY)  SAMPLE TO BLOOD BANK  TYPE AND SCREEN  ABO/RH  PREPARE RBC (CROSSMATCH)  GC/CHLAMYDIA PROBE AMP ([place])    Imaging Review Dg Chest 2 View  03/12/2015   CLINICAL DATA:  Leg swelling.  Abdominal pain  EXAM: CHEST  2 VIEW  COMPARISON:  03/29/2014  FINDINGS: The heart size and mediastinal contours are within normal limits. Both lungs are clear. The visualized skeletal structures are unremarkable.  IMPRESSION: No active  cardiopulmonary disease.   Electronically Signed   By: Franchot Gallo M.D.   On: 03/12/2015 16:28   Ct Abdomen Pelvis W Contrast  03/12/2015   CLINICAL DATA:  44 year old female with left upper head and lower abdominal pain for the past several days, with some associated abdominal fullness. Possible history of gynecologic malignancy.  EXAM: CT ABDOMEN AND PELVIS WITH CONTRAST  TECHNIQUE: Multidetector CT imaging of the abdomen and pelvis was performed using the standard protocol following bolus administration of intravenous contrast.  CONTRAST:  179mL OMNIPAQUE IOHEXOL 300 MG/ML  SOLN  COMPARISON:  CT of the chest, abdomen and pelvis 03/29/2014.  FINDINGS: Lower chest:  Mild scarring in the lung bases bilaterally.  Hepatobiliary: No cystic or solid hepatic lesions. No intra or extrahepatic biliary ductal dilatation. Gallbladder is normal in appearance.  Pancreas: Unremarkable.  Spleen: Unremarkable.  Adrenals/Urinary Tract: Well-defined 1.7 cm low-attenuation lesion in the lower pole of the right kidney is unchanged compared to the prior study, compatible with a simple cyst. Left kidney and bilateral adrenal glands are normal in appearance. No hydroureteronephrosis. Urinary bladder is normal in appearance.  Stomach/Bowel: The appearance of the stomach is normal. No pathologic dilatation of small bowel or colon. Normal appendix.  Vascular/Lymphatic: No significant atherosclerotic disease, aneurysm or dissection identified in the abdominal or pelvic vasculature. No lymphadenopathy noted in the abdomen or pelvis.  Reproductive: 12 mm enhancing lesion in the posterior aspect of the uterine body likely to represent a small fibroid. Fluid or soft tissue in the endometrial canal measuring up to 22 mm in thickness. Very heterogeneous appearing enlarged lower uterine segment and cervical region, somewhat mass-like in appearance, best appreciated on image 69 of series 2. 2.5 x 2.1 x 1.8 cm low-attenuation lesion in the  lower right vaginal wall or labia (image 86 of series 2).  Other: No significant volume of ascites.  No pneumoperitoneum.  Musculoskeletal: There are no aggressive appearing lytic or blastic lesions noted in the visualized portions of the skeleton.  IMPRESSION: 1. No acute findings in the abdomen or pelvis to account for the patient's symptoms. 2. Normal appendix. 3. Unusual heterogeneous mass-like enlargement of the lower uterine segment and cervical region. The possibility of cervical neoplasm should be considered, although some of this appearance could be accounted for by multiple small Nabothian cysts. In addition, there  is a large amount of fluid or soft tissue in the endometrial canal, with the endometrial stripe measuring approximately 22 mm in thickness. Further evaluation with nonemergent pelvic ultrasound is recommended in the near future to better characterize these findings. 4. Low-attenuation lesion associated with the lower right lateral vaginal wall or labia, favored to represent a gynecologic cyst such as a Bartholin's gland cyst.   Electronically Signed   By: Vinnie Langton M.D.   On: 03/12/2015 19:01     EKG Interpretation   Date/Time:  Thursday March 12 2015 13:03:51 EDT Ventricular Rate:  79 PR Interval:  160 QRS Duration: 76 QT Interval:  388 QTC Calculation: 444 R Axis:   70 Text Interpretation:  Normal sinus rhythm Normal ECG No significant change  was found Confirmed by [facility]  MD, TREY ([id number]) on 03/12/2015 4:43:11 PM      MDM   Final diagnoses:  Symptomatic anemia  Generalized abdominal pain    Previous medical records reviewed. Pt with hx of endometrial cancer, dx in [place] in 2007, pt reports last confirmed dx was in [place] in 2012, notes from OB/GYN from Kerry Hough, PA-C in 2014 indicated pt was to continue taking her iron pills and to f/u in 2-3 weeks. No additional notes in system.  Pt states she does not recall this appointment and believes this is when she  was moving back to the area and did not have insurance at the time.   Pt did have a normal CT abdomen in 2015, which pt had for c/o abdominal pain and chest pain.   Due to pt appearing uncomfortable with reports of early satiety, 10 pound weight gain and abdominal pain with pressure in 4-5 days, will get repeat CT scan to ensure no metastatic disease, SBO, or perforation.     Hgb is low for pt at 7.2 compared to last reading of 8.4 eleven months ago. Steady decreased from 9.4 1 year ago.   Hemoccult is negative. Pt is alert and oriented.    CT abd: no acute findings. Recommended non-emergent pelvic U/S for further detail of pelvic findings.  Discussed pt with Dr. Doy Mince, will admit for symptomatic anemia.  Likely due to pt's hx of heavy menstrual cycles. No evidence of GI bleed at this time.   8:07 PM  Consulted with Dr. Posey Pronto who will examine pt and repeat labs to help determine pt's disposition.   Pt to be admitted to a tele bed for symptomatic anemia.     Noland Fordyce, PA-C 03/12/15 2226  Serita Grit, MD 03/12/15 [phone]

## 2015-03-12 NOTE — ED Provider Notes (Signed)
Medical screening examination/treatment/procedure(s) were conducted as a shared visit with non-physician practitioner(s) and myself.  I personally evaluated the patient during the encounter.   EKG Interpretation   Date/Time:  Thursday March 12 2015 13:03:51 EDT Ventricular Rate:  79 PR Interval:  160 QRS Duration: 76 QT Interval:  388 QTC Calculation: 444 R Axis:   70 Text Interpretation:  Normal sinus rhythm Normal ECG No significant change  was found Confirmed by [facility]  MD, TREY ([id number]) on 03/12/2015 4:43:11 PM      44 yo female with abdominal bloating, found to be anemic at PCP's office.  Also complains of fatigue, shortness of breath, DOE, lightheadedness. On exam, well appearing, nontoxic, not distressed, normal respiratory effort, normal perfusion, abdomen soft, mildly distended, tender to palpation in LUQ, no r/r/g.  Hg 7.2.  Plan CT to eval her abdominal pain and admit for symptomatic anemia.    Clinical Impression: 1. Symptomatic anemia   2. Generalized abdominal pain       Serita Grit, MD 03/12/15 [phone]

## 2015-03-12 NOTE — H&P (Signed)
[facility] History and Physical  Patient: Autumn Gordon  MRN: [id number]  DOB: 12/18/1970  DOS: the patient was seen and examined on 03/12/2015 PCP: Simona Huh, MD  Referring physician: Noland Fordyce, PA-C Chief Complaint: abdominal pain  HPI: Autumn Gordon is a 44 y.o. female with Past medical history of fibromyalgia, menorrhagia, hypertension. The patient is presenting with complaints of lower abdominal pain as well as leg swelling. She mentions this has been ongoing for last 1 week and progressively worsening. The pain she is describing as a cramping and located is an L-shaped in the left side as well as lower abdomen. She also complains of difficulty eating secondary to sense of nausea. Does not have any vomiting. Last bowel movement was earlier in the morning. She denies any prior abdominal surgeries. She also complains that she has some chest pain which is located in the epigastric region and feels like a sharp stabbing pain worsening with deep breaths. The pain also worsens with movement. She has been taking ibuprofen. She denies any focal deficit, burning urination, blood in the urine. Her last period was 3 weeks ago.  The patient is coming from home. And at her baseline independent for most of her ADL.  Review of Systems: as mentioned in the history of present illness.  A comprehensive review of the other systems is negative.  Past Medical History  Diagnosis Date  . Fibromyalgia   . Anemia 05/31/2012  . Endometrial cancer   . Migraine   . COPD (chronic obstructive pulmonary disease)   . Sleep apnea   . Asthma   . Hypertension    Past Surgical History  Procedure Laterality Date  . Total shoulder replacement     Social History:  reports that she has been smoking Cigarettes.  She has a 5 pack-year smoking history. She has never used smokeless tobacco. She reports that she does not drink alcohol or use illicit drugs.  Allergies    Allergen Reactions  . Shellfish Allergy Anaphylaxis  . Tramadol Hives    Family History  Problem Relation Age of Onset  . Coronary artery disease Father   . Heart attack Father   . Hypertension Other   . Diabetes Other   . Cancer Other     Prior to Admission medications   Medication Sig Start Date End Date Taking? Authorizing Provider  diphenhydrAMINE (BENADRYL) 25 MG tablet Take 25 mg by mouth every 6 (six) hours as needed for allergies or sleep.   Yes Historical Provider, MD  hydrocortisone cream 1 % Apply 1 application topically as needed for itching (for face).   Yes Historical Provider, MD  ibuprofen (ADVIL,MOTRIN) 200 MG tablet Take 400 mg by mouth every 6 (six) hours as needed for moderate pain.   Yes Historical Provider, MD  Iron TABS Take 1 tablet by mouth once a week.   Yes Historical Provider, MD  cyclobenzaprine (FLEXERIL) 10 MG tablet Take 1 tablet (10 mg total) by mouth 2 (two) times daily as needed for muscle spasms. 12/17/14   Tatyana Kirichenko, PA-C  HYDROcodone-acetaminophen (NORCO) 5-325 MG per tablet Take 1 tablet by mouth every 6 (six) hours as needed for moderate pain. 12/17/14   Jeannett Senior, PA-C    Physical Exam: Filed Vitals:   03/12/15 2215 03/12/15 2222 03/12/15 2222 03/12/15 2313  BP: 110/63 128/69 128/69 146/73  Pulse: 70 70 70 70  Temp:  98.3 F (36.8 C) 98.3 F (36.8 C) 98 F (36.7 C)  TempSrc:  Oral Oral Oral  Resp: '12 16 16 18  '$ Height:    '5\' 4"'$  (1.626 m)  Weight:    78 kg (171 lb 15.3 oz)  SpO2: 98%  100% 100%    General: Alert, Awake and Oriented to Time, Place and Person. Appear in mild distress Eyes: PERRL ENT: Oral Mucosa clear moist. Neck: no JVD Cardiovascular: S1 and S2 Present, no Murmur, Peripheral Pulses Present Respiratory: Bilateral Air entry equal and Decreased,  Clear to Auscultation, no Crackles, no wheezes Abdomen: Bowel Sound present, Soft and diffusely tender even to auscultation with stethoscope. Skin: no  Rash Extremities: no Pedal edema, no calf tenderness Neurologic: Grossly no focal neuro deficit.  Labs on Admission:  CBC:  Recent Labs Lab 03/12/15 1331 03/12/15 2045  WBC 5.2 5.4  NEUTROABS 2.4  --   HGB 7.2* 7.0*  HCT 25.2* 24.2*  MCV 59.2* 59.0*  PLT 511* 495*    CMP     Component Value Date/Time   NA 137 03/12/2015 1331   K 3.8 03/12/2015 1331   CL 105 03/12/2015 1331   CO2 24 03/12/2015 1331   GLUCOSE 86 03/12/2015 1331   BUN <5* 03/12/2015 1331   CREATININE 0.63 03/12/2015 1331   CALCIUM 9.1 03/12/2015 1331   PROT 7.6 03/12/2015 1331   ALBUMIN 3.9 03/12/2015 1331   AST 21 03/12/2015 1331   ALT 13 03/12/2015 1331   ALKPHOS 64 03/12/2015 1331   BILITOT 0.3 03/12/2015 1331   GFRNONAA >90 03/12/2015 1331   GFRAA >90 03/12/2015 1331    No results for input(s): LIPASE, AMYLASE in the last 168 hours.   Recent Labs Lab 03/12/15 1331  TROPONINI <0.03   BNP (last 3 results)  Recent Labs  03/12/15 1331  BNP 16.7    ProBNP (last 3 results) No results for input(s): PROBNP in the last 8760 hours.   Radiological Exams on Admission: Dg Chest 2 View  03/12/2015   CLINICAL DATA:  Leg swelling.  Abdominal pain  EXAM: CHEST  2 VIEW  COMPARISON:  03/29/2014  FINDINGS: The heart size and mediastinal contours are within normal limits. Both lungs are clear. The visualized skeletal structures are unremarkable.  IMPRESSION: No active cardiopulmonary disease.   Electronically Signed   By: Franchot Gallo M.D.   On: 03/12/2015 16:28   Ct Abdomen Pelvis W Contrast  03/12/2015   CLINICAL DATA:  44 year old female with left upper head and lower abdominal pain for the past several days, with some associated abdominal fullness. Possible history of gynecologic malignancy.  EXAM: CT ABDOMEN AND PELVIS WITH CONTRAST  TECHNIQUE: Multidetector CT imaging of the abdomen and pelvis was performed using the standard protocol following bolus administration of intravenous contrast.   CONTRAST:  183mL OMNIPAQUE IOHEXOL 300 MG/ML  SOLN  COMPARISON:  CT of the chest, abdomen and pelvis 03/29/2014.  FINDINGS: Lower chest:  Mild scarring in the lung bases bilaterally.  Hepatobiliary: No cystic or solid hepatic lesions. No intra or extrahepatic biliary ductal dilatation. Gallbladder is normal in appearance.  Pancreas: Unremarkable.  Spleen: Unremarkable.  Adrenals/Urinary Tract: Well-defined 1.7 cm low-attenuation lesion in the lower pole of the right kidney is unchanged compared to the prior study, compatible with a simple cyst. Left kidney and bilateral adrenal glands are normal in appearance. No hydroureteronephrosis. Urinary bladder is normal in appearance.  Stomach/Bowel: The appearance of the stomach is normal. No pathologic dilatation of small bowel or colon. Normal appendix.  Vascular/Lymphatic: No significant atherosclerotic disease, aneurysm or dissection identified in the abdominal or  pelvic vasculature. No lymphadenopathy noted in the abdomen or pelvis.  Reproductive: 12 mm enhancing lesion in the posterior aspect of the uterine body likely to represent a small fibroid. Fluid or soft tissue in the endometrial canal measuring up to 22 mm in thickness. Very heterogeneous appearing enlarged lower uterine segment and cervical region, somewhat mass-like in appearance, best appreciated on image 69 of series 2. 2.5 x 2.1 x 1.8 cm low-attenuation lesion in the lower right vaginal wall or labia (image 86 of series 2).  Other: No significant volume of ascites.  No pneumoperitoneum.  Musculoskeletal: There are no aggressive appearing lytic or blastic lesions noted in the visualized portions of the skeleton.  IMPRESSION: 1. No acute findings in the abdomen or pelvis to account for the patient's symptoms. 2. Normal appendix. 3. Unusual heterogeneous mass-like enlargement of the lower uterine segment and cervical region. The possibility of cervical neoplasm should be considered, although some of this  appearance could be accounted for by multiple small Nabothian cysts. In addition, there is a large amount of fluid or soft tissue in the endometrial canal, with the endometrial stripe measuring approximately 22 mm in thickness. Further evaluation with nonemergent pelvic ultrasound is recommended in the near future to better characterize these findings. 4. Low-attenuation lesion associated with the lower right lateral vaginal wall or labia, favored to represent a gynecologic cyst such as a Bartholin's gland cyst.   Electronically Signed   By: Vinnie Langton M.D.   On: 03/12/2015 19:01   EKG: Independently reviewed. normal sinus rhythm, nonspecific ST and T waves changes.  Assessment/Plan Principal Problem:   Symptomatic anemia Active Problems:   Thickened endometrium   Menorrhagia   Microcytic red blood cells   Abdominal pain   1. Symptomatic anemia The patient is presenting with complaints of abdominal pain, leg swelling, occasional chest pain, nausea, poor appetite. With all his multiple complains she also has fatigue and weakness. Further workup shows she has significant anemia which appears to be microcytic and hypochromic. Most likely secondary to long-standing menorrhagia. She may also have dysmenorrhea. With this I would admit her to the hospital and will give HER-2 units of blood transfusion. Monitor her on telemetry. Neck and monitor H&H.  2. Abdominal pain. The patient has complained of abdominal pain ongoing for last few weeks. She has a CT scan which is concerning for masslike enlargement of the lower uterine segment with possibility of cervical neoplasm versus multiple cyst. We will obtain pelvic ultrasound for further clarification of the findings. She may require OB/GYN consultation inpatient if her pain does not improve otherwise follow up with OB/GYN as an outpatient for endometrial biopsy.  3. Intermittent chest pain. Less likely of cardiac origin. CT scan is  negative EKG is negative initial troponin negative. Will use IV Protonix.  Advance goals of care discussion: Full code   DVT Prophylaxis: mechanical compression device Nutrition: Clear liquid diet  Family Communication: family was present at bedside, opportunity was given to ask question and all questions were answered satisfactorily at the time of interview. Disposition: Admitted as inpatient, telemetry unit.  Author: Berle Mull, MD [facility] Pager: [phone] 03/12/2015  If 7PM-7AM, please contact night-coverage [email or web address] Password [email or web address]

## 2015-03-12 NOTE — ED Notes (Signed)
Noland Fordyce PA collected the wet prep, genital

## 2015-03-12 NOTE — ED Notes (Signed)
Seen at PCP, sent for possible blood transfusion. States distended abdomen and bilateral leg swelling x 4-5 days. Reports nausea.

## 2015-03-13 ENCOUNTER — Encounter (HOSPITAL_COMMUNITY): Payer: Self-pay | Admitting: General Practice

## 2015-03-13 ENCOUNTER — Inpatient Hospital Stay (HOSPITAL_COMMUNITY): Payer: Managed Care, Other (non HMO)

## 2015-03-13 LAB — PROTIME-INR
INR: 1.12 (ref 0.00–1.49)
PROTHROMBIN TIME: 14.5 s (ref 11.6–15.2)

## 2015-03-13 LAB — CBC
HCT: 31.5 % — ABNORMAL LOW (ref 36.0–46.0)
HEMOGLOBIN: 9.5 g/dL — AB (ref 12.0–15.0)
MCH: 19.2 pg — ABNORMAL LOW (ref 26.0–34.0)
MCHC: 30.2 g/dL (ref 30.0–36.0)
MCV: 63.8 fL — AB (ref 78.0–100.0)
PLATELETS: 445 10*3/uL — AB (ref 150–400)
RBC: 4.94 MIL/uL (ref 3.87–5.11)
RDW: 22.2 % — ABNORMAL HIGH (ref 11.5–15.5)
WBC: 5.1 10*3/uL (ref 4.0–10.5)

## 2015-03-13 LAB — COMPREHENSIVE METABOLIC PANEL
ALT: 13 U/L (ref 0–35)
AST: 21 U/L (ref 0–37)
Albumin: 3.7 g/dL (ref 3.5–5.2)
Alkaline Phosphatase: 59 U/L (ref 39–117)
Anion gap: 10 (ref 5–15)
BILIRUBIN TOTAL: 0.8 mg/dL (ref 0.3–1.2)
BUN: 6 mg/dL (ref 6–23)
CALCIUM: 9.1 mg/dL (ref 8.4–10.5)
CHLORIDE: 106 mmol/L (ref 96–112)
CO2: 22 mmol/L (ref 19–32)
Creatinine, Ser: 0.77 mg/dL (ref 0.50–1.10)
GFR calc Af Amer: >90 mL/min (ref >90)
GFR calc non Af Amer: >90 mL/min (ref >90)
GLUCOSE: 74 mg/dL (ref 70–99)
POTASSIUM: 4.1 mmol/L (ref 3.5–5.1)
SODIUM: 138 mmol/L (ref 135–145)
TOTAL PROTEIN: 7.1 g/dL (ref 6.0–8.3)

## 2015-03-13 LAB — GC/CHLAMYDIA PROBE AMP (~~LOC~~) NOT AT ARMC
Chlamydia: NEGATIVE
Neisseria Gonorrhea: NEGATIVE

## 2015-03-13 LAB — IRON AND TIBC
IRON: 12 ug/dL — AB (ref 42–145)
SATURATION RATIOS: 3 % — AB (ref 20–55)
TIBC: 478 ug/dL — ABNORMAL HIGH (ref 250–470)
UIBC: 466 ug/dL — ABNORMAL HIGH (ref 125–400)

## 2015-03-13 LAB — FERRITIN: Ferritin: 6 ng/mL — ABNORMAL LOW (ref 10–291)

## 2015-03-13 LAB — LACTIC ACID, PLASMA: Lactic Acid, Venous: 1.1 mmol/L (ref 0.5–2.0)

## 2015-03-13 MED ORDER — PANTOPRAZOLE SODIUM 40 MG PO TBEC
40.0000 mg | DELAYED_RELEASE_TABLET | Freq: Every day | ORAL | Status: DC
  Administered 2015-03-13 – 2015-03-15 (×3): 40 mg via ORAL
  Filled 2015-03-13 (×3): qty 1

## 2015-03-13 NOTE — Progress Notes (Signed)
Patient transported to 3e via stretcher from ED.  Pain in abdomen. Gave PRN oxycodone.  Patient had blood running. Placed on tele box 13. Vital signs stable. 2nd unit of blood started.

## 2015-03-13 NOTE — Progress Notes (Signed)
CARE MANAGEMENT NOTE 03/13/2015  Patient:  Autumn Gordon, Autumn Gordon   Account Number:  [id number]  Date Initiated:  03/13/2015  Documentation initiated by:  Lorne Skeens  Subjective/Objective Assessment:   Patient was admitted with symptomatic anemia.  Lives at home with children.     Action/Plan:   Will follow for discharge needs.   Anticipated DC Date:     Anticipated DC Plan:  HOME/SELF CARE         Choice offered to / List presented to:             Status of service:  In process, will continue to follow Medicare Important Message given?   (If response is "NO", the following Medicare IM given date fields will be blank) Date Medicare IM given:   Medicare IM given by:   Date Additional Medicare IM given:   Additional Medicare IM given by:    Discharge Disposition:    Per UR Regulation:  Reviewed for med. necessity/level of care/duration of stay  If discussed at [place] of Stay Meetings, dates discussed:    Comments:

## 2015-03-13 NOTE — Progress Notes (Signed)
[facility] PROGRESS NOTE  Autumn Gordon ZOX:[id number] DOB: 1971/01/09 DOA: 03/12/2015 PCP: Simona Huh, MD  Assessment/Plan: 1. Symptomatic anemia: S/p 2 units of prbc transfusion and repeat H&H is 9.5. She denies any bleeding currently. Her pelvic ultrasound showed a  Probable collapsing physiologic cyst in the right ovary and nomal left ovary.  Trilaminar endometrial stripe appearance typical for imaging at the mid cycle.  Will call ob gyn if her abdominal pain is not well controlled.   Epigastric pain and aytpical chest pain: improved.   Code Status: full code.  Family Communication: none at bedside. Disposition Plan: pending.    Consultants:  none  Procedures:  CT abdomen a nd pelvis.   Antibiotics: none HPI/Subjective: Her pain is much better.   Objective: Filed Vitals:   03/13/15 1446  BP: 107/62  Pulse: 71  Temp: 98.3 F (36.8 C)  Resp: 18    Intake/Output Summary (Last 24 hours) at 03/13/15 1745 Last data filed at 03/13/15 1512  Gross per 24 hour  Intake   2270 ml  Output      2 ml  Net   2268 ml   Filed Weights   03/12/15 1255 03/12/15 2313  Weight: 79.833 kg (176 lb) 78 kg (171 lb 15.3 oz)    Exam:   General:  Alert afebrile comfortable.   Cardiovascular: s1s2  Respiratory: ctab  Abdomen: soft  Mild Tenderness in the right lower quadrant,   non distended bowel sounds hears.   Musculoskeletal: no pedal edema.   Data Reviewed: Basic Metabolic Panel:  Recent Labs Lab 03/12/15 1331 03/12/15 2045 03/13/15 0600  NA 137  --  138  K 3.8  --  4.1  CL 105  --  106  CO2 24  --  22  GLUCOSE 86  --  74  BUN <5*  --  6  CREATININE 0.63  --  0.77  CALCIUM 9.1  --  9.1  MG  --  2.1  --    Liver Function Tests:  Recent Labs Lab 03/12/15 1331 03/13/15 0600  AST 21 21  ALT 13 13  ALKPHOS 64 59  BILITOT 0.3 0.8  PROT 7.6 7.1  ALBUMIN 3.9 3.7   No results for input(s): LIPASE, AMYLASE in the last 168 hours. No  results for input(s): AMMONIA in the last 168 hours. CBC:  Recent Labs Lab 03/12/15 1331 03/12/15 2045 03/13/15 0600  WBC 5.2 5.4 5.1  NEUTROABS 2.4  --   --   HGB 7.2* 7.0* 9.5*  HCT 25.2* 24.2* 31.5*  MCV 59.2* 59.0* 63.8*  PLT 511* 495* 445*   Cardiac Enzymes:  Recent Labs Lab 03/12/15 1331  TROPONINI <0.03   BNP (last 3 results)  Recent Labs  03/12/15 1331  BNP 16.7    ProBNP (last 3 results) No results for input(s): PROBNP in the last 8760 hours.  CBG: No results for input(s): GLUCAP in the last 168 hours.  Recent Results (from the past 240 hour(s))  Wet prep, genital     Status: Abnormal   Collection Time: 03/12/15  4:35 PM  Result Value Ref Range Status   Yeast Wet Prep HPF POC NONE SEEN NONE SEEN Final   Trich, Wet Prep NONE SEEN NONE SEEN Final   Clue Cells Wet Prep HPF POC FEW (A) NONE SEEN Final   WBC, Wet Prep HPF POC FEW (A) NONE SEEN Final     Studies: Dg Chest 2 View  03/12/2015   CLINICAL DATA:  Leg  swelling.  Abdominal pain  EXAM: CHEST  2 VIEW  COMPARISON:  03/29/2014  FINDINGS: The heart size and mediastinal contours are within normal limits. Both lungs are clear. The visualized skeletal structures are unremarkable.  IMPRESSION: No active cardiopulmonary disease.   Electronically Signed   By: Franchot Gallo M.D.   On: 03/12/2015 16:28   US Transvaginal Non-ob  03/13/2015   CLINICAL DATA:  Thickened endometrial stripe on CT, low abdominal pain for several days. Abdominal fullness.  EXAM: TRANSABDOMINAL AND TRANSVAGINAL ULTRASOUND OF PELVIS  TECHNIQUE: Both transabdominal and transvaginal ultrasound examinations of the pelvis were performed. Transabdominal technique was performed for global imaging of the pelvis including uterus, ovaries, adnexal regions, and pelvic cul-de-sac. It was necessary to proceed with endovaginal exam following the transabdominal exam to visualize the endometrium and ovaries.  COMPARISON:  CT abdomen/ pelvis 03/12/2015   FINDINGS: Uterus  Measurements: 10.5 x 7.2 x 6.3 cm. Anteverted, anteflexed. Posterior uterine body/ fundal intramural fibroid measures 1.8 x 1.6 x 1.0 cm. Posterior uterine body intramural fibroid measures 1.6 x 1.5 x 1.3 cm.  Endometrium  Thickness: Mildly inhomogeneous and trilaminar in appearance. Possible echogenic filling defect at the fundus versus endometrial inhomogeneity measuring 1.5 x 1.2 by 2.0 cm.  Right ovary  Measurements: 3.4 x 2.2 x 1.9 cm. Probable collapsing physiologic cyst measuring 1.7 x 1.6 x 1.5 cm  Left ovary  Measurements: 2.8 x 2.0 x 1.8 cm. Normal appearance/no adnexal mass.  Other findings  No free fluid.  IMPRESSION: Trilaminar endometrial stripe appearance typical for imaging at the mid cycle. Possible echogenic filling defect at the uterine fundal endometrial canal versus endometrial inhomogeneity. Followup pelvic ultrasound is recommended in 4-6 weeks during the week specifically following the patient's menses, in order to evaluate for possible polyp or other focal abnormality such as hyperplasia or neoplasia versus artifactual thickness.  Fibroid uterus as above.   Electronically Signed   By: Conchita Paris M.D.   On: 03/13/2015 09:06   US Pelvis Complete  03/13/2015   CLINICAL DATA:  Thickened endometrial stripe on CT, low abdominal pain for several days. Abdominal fullness.  EXAM: TRANSABDOMINAL AND TRANSVAGINAL ULTRASOUND OF PELVIS  TECHNIQUE: Both transabdominal and transvaginal ultrasound examinations of the pelvis were performed. Transabdominal technique was performed for global imaging of the pelvis including uterus, ovaries, adnexal regions, and pelvic cul-de-sac. It was necessary to proceed with endovaginal exam following the transabdominal exam to visualize the endometrium and ovaries.  COMPARISON:  CT abdomen/ pelvis 03/12/2015  FINDINGS: Uterus  Measurements: 10.5 x 7.2 x 6.3 cm. Anteverted, anteflexed. Posterior uterine body/ fundal intramural fibroid measures 1.8  x 1.6 x 1.0 cm. Posterior uterine body intramural fibroid measures 1.6 x 1.5 x 1.3 cm.  Endometrium  Thickness: Mildly inhomogeneous and trilaminar in appearance. Possible echogenic filling defect at the fundus versus endometrial inhomogeneity measuring 1.5 x 1.2 by 2.0 cm.  Right ovary  Measurements: 3.4 x 2.2 x 1.9 cm. Probable collapsing physiologic cyst measuring 1.7 x 1.6 x 1.5 cm  Left ovary  Measurements: 2.8 x 2.0 x 1.8 cm. Normal appearance/no adnexal mass.  Other findings  No free fluid.  IMPRESSION: Trilaminar endometrial stripe appearance typical for imaging at the mid cycle. Possible echogenic filling defect at the uterine fundal endometrial canal versus endometrial inhomogeneity. Followup pelvic ultrasound is recommended in 4-6 weeks during the week specifically following the patient's menses, in order to evaluate for possible polyp or other focal abnormality such as hyperplasia or neoplasia versus artifactual thickness.  Fibroid uterus as above.   Electronically Signed   By: Conchita Paris M.D.   On: 03/13/2015 09:06   Ct Abdomen Pelvis W Contrast  03/12/2015   CLINICAL DATA:  44 year old female with left upper head and lower abdominal pain for the past several days, with some associated abdominal fullness. Possible history of gynecologic malignancy.  EXAM: CT ABDOMEN AND PELVIS WITH CONTRAST  TECHNIQUE: Multidetector CT imaging of the abdomen and pelvis was performed using the standard protocol following bolus administration of intravenous contrast.  CONTRAST:  163mL OMNIPAQUE IOHEXOL 300 MG/ML  SOLN  COMPARISON:  CT of the chest, abdomen and pelvis 03/29/2014.  FINDINGS: Lower chest:  Mild scarring in the lung bases bilaterally.  Hepatobiliary: No cystic or solid hepatic lesions. No intra or extrahepatic biliary ductal dilatation. Gallbladder is normal in appearance.  Pancreas: Unremarkable.  Spleen: Unremarkable.  Adrenals/Urinary Tract: Well-defined 1.7 cm low-attenuation lesion in the lower  pole of the right kidney is unchanged compared to the prior study, compatible with a simple cyst. Left kidney and bilateral adrenal glands are normal in appearance. No hydroureteronephrosis. Urinary bladder is normal in appearance.  Stomach/Bowel: The appearance of the stomach is normal. No pathologic dilatation of small bowel or colon. Normal appendix.  Vascular/Lymphatic: No significant atherosclerotic disease, aneurysm or dissection identified in the abdominal or pelvic vasculature. No lymphadenopathy noted in the abdomen or pelvis.  Reproductive: 12 mm enhancing lesion in the posterior aspect of the uterine body likely to represent a small fibroid. Fluid or soft tissue in the endometrial canal measuring up to 22 mm in thickness. Very heterogeneous appearing enlarged lower uterine segment and cervical region, somewhat mass-like in appearance, best appreciated on image 69 of series 2. 2.5 x 2.1 x 1.8 cm low-attenuation lesion in the lower right vaginal wall or labia (image 86 of series 2).  Other: No significant volume of ascites.  No pneumoperitoneum.  Musculoskeletal: There are no aggressive appearing lytic or blastic lesions noted in the visualized portions of the skeleton.  IMPRESSION: 1. No acute findings in the abdomen or pelvis to account for the patient's symptoms. 2. Normal appendix. 3. Unusual heterogeneous mass-like enlargement of the lower uterine segment and cervical region. The possibility of cervical neoplasm should be considered, although some of this appearance could be accounted for by multiple small Nabothian cysts. In addition, there is a large amount of fluid or soft tissue in the endometrial canal, with the endometrial stripe measuring approximately 22 mm in thickness. Further evaluation with nonemergent pelvic ultrasound is recommended in the near future to better characterize these findings. 4. Low-attenuation lesion associated with the lower right lateral vaginal wall or labia, favored to  represent a gynecologic cyst such as a Bartholin's gland cyst.   Electronically Signed   By: Vinnie Langton M.D.   On: 03/12/2015 19:01    Scheduled Meds: . acetaminophen  650 mg Oral Once  . pantoprazole  40 mg Oral QHS  . sodium chloride  3 mL Intravenous Q12H   Continuous Infusions:   Principal Problem:   Symptomatic anemia Active Problems:   Thickened endometrium   Menorrhagia   Microcytic red blood cells   Abdominal pain    Time spent: 30 minutes.     [facility] Pager [phone]. If 7PM-7AM, please contact night-coverage at [email or web address], password [email or web address] 03/13/2015, 5:45 PM  LOS: 1 day

## 2015-03-13 NOTE — Progress Notes (Signed)
UR complete.  Nowell Sites RN, MSN 

## 2015-03-14 ENCOUNTER — Inpatient Hospital Stay (HOSPITAL_COMMUNITY): Payer: Managed Care, Other (non HMO)

## 2015-03-14 LAB — TYPE AND SCREEN
ABO/RH(D): B POS
Antibody Screen: NEGATIVE
Unit division: 0
Unit division: 0

## 2015-03-14 LAB — HEMOGLOBIN AND HEMATOCRIT, BLOOD
HEMATOCRIT: 31 % — AB (ref 36.0–46.0)
Hemoglobin: 9.5 g/dL — ABNORMAL LOW (ref 12.0–15.0)

## 2015-03-14 MED ORDER — MORPHINE SULFATE 2 MG/ML IJ SOLN
1.0000 mg | INTRAMUSCULAR | Status: DC | PRN
  Administered 2015-03-14: 2 mg via INTRAVENOUS
  Filled 2015-03-14: qty 1

## 2015-03-14 MED ORDER — MORPHINE SULFATE 2 MG/ML IJ SOLN
2.0000 mg | INTRAMUSCULAR | Status: DC | PRN
  Administered 2015-03-14 – 2015-03-15 (×4): 2 mg via INTRAVENOUS
  Filled 2015-03-14 (×4): qty 1

## 2015-03-14 MED ORDER — GI COCKTAIL ~~LOC~~
30.0000 mL | Freq: Two times a day (BID) | ORAL | Status: DC | PRN
  Filled 2015-03-14: qty 30

## 2015-03-14 MED ORDER — OXYCODONE HCL 5 MG PO TABS
10.0000 mg | ORAL_TABLET | ORAL | Status: DC | PRN
  Administered 2015-03-14 – 2015-03-15 (×5): 10 mg via ORAL
  Filled 2015-03-14 (×5): qty 2

## 2015-03-14 NOTE — Progress Notes (Signed)
[facility] PROGRESS NOTE  Cherlynn Popiel XLK:[id number] DOB: Nov 08, 1971 DOA: 03/12/2015 PCP: Simona Huh, MD  Assessment/Plan: 1. Symptomatic anemia: S/p 2 units of prbc transfusion and repeat H&H is 9.5. She denies any bleeding currently. Her pelvic ultrasound showed a  Probable collapsing physiologic cyst in the right ovary and nomal left ovary.  Trilaminar endometrial stripe appearance typical for imaging at the mid cycle.  Patient reports her abdominal pain is not well controlled. Requested ob gyn consult for recommendations, increased her morphine and oxycodone. She is not nauseated or vomiting. She is able to tolerate regular diet.    Epigastric pain and aytpical chest pain: improved.   Code Status: full code.  Family Communication: none at bedside. Disposition Plan: pending.    Consultants:  Dr Kennon Rounds  Procedures:  CT abdomen a nd pelvis.   Antibiotics: none HPI/Subjective: Her abdominal pain not well controlled.   Objective: Filed Vitals:   03/14/15 1406  BP: 132/90  Pulse: 80  Temp: 98 F (36.7 C)  Resp:     Intake/Output Summary (Last 24 hours) at 03/14/15 1751 Last data filed at 03/14/15 1322  Gross per 24 hour  Intake   1000 ml  Output      3 ml  Net    997 ml   Filed Weights   03/12/15 1255 03/12/15 2313 03/14/15 0522  Weight: 79.833 kg (176 lb) 78 kg (171 lb 15.3 oz) 79.742 kg (175 lb 12.8 oz)    Exam:   General:  Alert afebrile comfortable.   Cardiovascular: s1s2  Respiratory: ctab  Abdomen: soft  Mild Tenderness in the right lower quadrant,   non distended bowel sounds hears.   Musculoskeletal: no pedal edema.   Data Reviewed: Basic Metabolic Panel:  Recent Labs Lab 03/12/15 1331 03/12/15 2045 03/13/15 0600  NA 137  --  138  K 3.8  --  4.1  CL 105  --  106  CO2 24  --  22  GLUCOSE 86  --  74  BUN <5*  --  6  CREATININE 0.63  --  0.77  CALCIUM 9.1  --  9.1  MG  --  2.1  --    Liver Function  Tests:  Recent Labs Lab 03/12/15 1331 03/13/15 0600  AST 21 21  ALT 13 13  ALKPHOS 64 59  BILITOT 0.3 0.8  PROT 7.6 7.1  ALBUMIN 3.9 3.7   No results for input(s): LIPASE, AMYLASE in the last 168 hours. No results for input(s): AMMONIA in the last 168 hours. CBC:  Recent Labs Lab 03/12/15 1331 03/12/15 2045 03/13/15 0600 03/14/15 1701  WBC 5.2 5.4 5.1  --   NEUTROABS 2.4  --   --   --   HGB 7.2* 7.0* 9.5* 9.5*  HCT 25.2* 24.2* 31.5* 31.0*  MCV 59.2* 59.0* 63.8*  --   PLT 511* 495* 445*  --    Cardiac Enzymes:  Recent Labs Lab 03/12/15 1331  TROPONINI <0.03   BNP (last 3 results)  Recent Labs  03/12/15 1331  BNP 16.7    ProBNP (last 3 results) No results for input(s): PROBNP in the last 8760 hours.  CBG: No results for input(s): GLUCAP in the last 168 hours.  Recent Results (from the past 240 hour(s))  Wet prep, genital     Status: Abnormal   Collection Time: 03/12/15  4:35 PM  Result Value Ref Range Status   Yeast Wet Prep HPF POC NONE SEEN NONE SEEN Final   Trich,  Wet Prep NONE SEEN NONE SEEN Final   Clue Cells Wet Prep HPF POC FEW (A) NONE SEEN Final   WBC, Wet Prep HPF POC FEW (A) NONE SEEN Final     Studies: US Transvaginal Non-ob  03/18/15   CLINICAL DATA:  Persistent pelvic pain. Right-sided pain worsening today. History of uterine cancer, anemia. LMP 02/14/2015.  EXAM: ULTRASOUND PELVIS TRANSVAGINAL  TECHNIQUE: Transvaginal ultrasound examination of the pelvis was performed including evaluation of the uterus, ovaries, adnexal regions, and pelvic cul-de-sac.  COMPARISON:  03/13/2015  FINDINGS: Uterus  Measurements: Golden Circle least 10.8 x 6.2 x 6.4 cm. Mildly heterogeneous echotexture. Small posterior hypoechoic areas measure 1.6 x 1.2 x 1.3 cm and 1.8 x 1.5 x 1.4 cm consistent with fibroids.  Endometrium  Thickness: 19 mm in thickness. Focal hyperechoic abnormalities identified within the fundal endometrium measuring 1.5 x 1.2 x 1.1 cm.  Right ovary   Measurements: 3.3 x 1.9 x 2.6 cm. Small follicles are present. Color flow is noted within the ovary.  Left ovary  Measurements: And 2.6 x 2.0 x 1.8 cm. Normal appearance/no adnexal mass. Color flow is noted.  Other findings:  No free fluid  IMPRESSION: 1. Enlarged uterus containing at least 2 discrete fibroids. 2. Thickened endometrium with focal hyperechoic region in the fundal region raising the question of discrete lesion. Follow-up pelvic ultrasound is suggested for a 6 weeks. Timing of this exam is recommended for the week following the patient's menstrual cycle to determine if this lesion persists. 3. Normal appearance of the ovaries. No adnexal mass. No evidence for ovarian torsion.   Electronically Signed   By: Nolon Nations M.D.   On: 2015-03-18 17:31   US Transvaginal Non-ob  03/13/2015   CLINICAL DATA:  Thickened endometrial stripe on CT, low abdominal pain for several days. Abdominal fullness.  EXAM: TRANSABDOMINAL AND TRANSVAGINAL ULTRASOUND OF PELVIS  TECHNIQUE: Both transabdominal and transvaginal ultrasound examinations of the pelvis were performed. Transabdominal technique was performed for global imaging of the pelvis including uterus, ovaries, adnexal regions, and pelvic cul-de-sac. It was necessary to proceed with endovaginal exam following the transabdominal exam to visualize the endometrium and ovaries.  COMPARISON:  CT abdomen/ pelvis 03/12/2015  FINDINGS: Uterus  Measurements: 10.5 x 7.2 x 6.3 cm. Anteverted, anteflexed. Posterior uterine body/ fundal intramural fibroid measures 1.8 x 1.6 x 1.0 cm. Posterior uterine body intramural fibroid measures 1.6 x 1.5 x 1.3 cm.  Endometrium  Thickness: Mildly inhomogeneous and trilaminar in appearance. Possible echogenic filling defect at the fundus versus endometrial inhomogeneity measuring 1.5 x 1.2 by 2.0 cm.  Right ovary  Measurements: 3.4 x 2.2 x 1.9 cm. Probable collapsing physiologic cyst measuring 1.7 x 1.6 x 1.5 cm  Left ovary   Measurements: 2.8 x 2.0 x 1.8 cm. Normal appearance/no adnexal mass.  Other findings  No free fluid.  IMPRESSION: Trilaminar endometrial stripe appearance typical for imaging at the mid cycle. Possible echogenic filling defect at the uterine fundal endometrial canal versus endometrial inhomogeneity. Followup pelvic ultrasound is recommended in 4-6 weeks during the week specifically following the patient's menses, in order to evaluate for possible polyp or other focal abnormality such as hyperplasia or neoplasia versus artifactual thickness.  Fibroid uterus as above.   Electronically Signed   By: Conchita Paris M.D.   On: 03/13/2015 09:06   US Pelvis Complete  03/13/2015   CLINICAL DATA:  Thickened endometrial stripe on CT, low abdominal pain for several days. Abdominal fullness.  EXAM: TRANSABDOMINAL AND TRANSVAGINAL ULTRASOUND  OF PELVIS  TECHNIQUE: Both transabdominal and transvaginal ultrasound examinations of the pelvis were performed. Transabdominal technique was performed for global imaging of the pelvis including uterus, ovaries, adnexal regions, and pelvic cul-de-sac. It was necessary to proceed with endovaginal exam following the transabdominal exam to visualize the endometrium and ovaries.  COMPARISON:  CT abdomen/ pelvis 03/12/2015  FINDINGS: Uterus  Measurements: 10.5 x 7.2 x 6.3 cm. Anteverted, anteflexed. Posterior uterine body/ fundal intramural fibroid measures 1.8 x 1.6 x 1.0 cm. Posterior uterine body intramural fibroid measures 1.6 x 1.5 x 1.3 cm.  Endometrium  Thickness: Mildly inhomogeneous and trilaminar in appearance. Possible echogenic filling defect at the fundus versus endometrial inhomogeneity measuring 1.5 x 1.2 by 2.0 cm.  Right ovary  Measurements: 3.4 x 2.2 x 1.9 cm. Probable collapsing physiologic cyst measuring 1.7 x 1.6 x 1.5 cm  Left ovary  Measurements: 2.8 x 2.0 x 1.8 cm. Normal appearance/no adnexal mass.  Other findings  No free fluid.  IMPRESSION: Trilaminar endometrial  stripe appearance typical for imaging at the mid cycle. Possible echogenic filling defect at the uterine fundal endometrial canal versus endometrial inhomogeneity. Followup pelvic ultrasound is recommended in 4-6 weeks during the week specifically following the patient's menses, in order to evaluate for possible polyp or other focal abnormality such as hyperplasia or neoplasia versus artifactual thickness.  Fibroid uterus as above.   Electronically Signed   By: Conchita Paris M.D.   On: 03/13/2015 09:06   Ct Abdomen Pelvis W Contrast  03/12/2015   CLINICAL DATA:  44 year old female with left upper head and lower abdominal pain for the past several days, with some associated abdominal fullness. Possible history of gynecologic malignancy.  EXAM: CT ABDOMEN AND PELVIS WITH CONTRAST  TECHNIQUE: Multidetector CT imaging of the abdomen and pelvis was performed using the standard protocol following bolus administration of intravenous contrast.  CONTRAST:  154mL OMNIPAQUE IOHEXOL 300 MG/ML  SOLN  COMPARISON:  CT of the chest, abdomen and pelvis 03/29/2014.  FINDINGS: Lower chest:  Mild scarring in the lung bases bilaterally.  Hepatobiliary: No cystic or solid hepatic lesions. No intra or extrahepatic biliary ductal dilatation. Gallbladder is normal in appearance.  Pancreas: Unremarkable.  Spleen: Unremarkable.  Adrenals/Urinary Tract: Well-defined 1.7 cm low-attenuation lesion in the lower pole of the right kidney is unchanged compared to the prior study, compatible with a simple cyst. Left kidney and bilateral adrenal glands are normal in appearance. No hydroureteronephrosis. Urinary bladder is normal in appearance.  Stomach/Bowel: The appearance of the stomach is normal. No pathologic dilatation of small bowel or colon. Normal appendix.  Vascular/Lymphatic: No significant atherosclerotic disease, aneurysm or dissection identified in the abdominal or pelvic vasculature. No lymphadenopathy noted in the abdomen or  pelvis.  Reproductive: 12 mm enhancing lesion in the posterior aspect of the uterine body likely to represent a small fibroid. Fluid or soft tissue in the endometrial canal measuring up to 22 mm in thickness. Very heterogeneous appearing enlarged lower uterine segment and cervical region, somewhat mass-like in appearance, best appreciated on image 69 of series 2. 2.5 x 2.1 x 1.8 cm low-attenuation lesion in the lower right vaginal wall or labia (image 86 of series 2).  Other: No significant volume of ascites.  No pneumoperitoneum.  Musculoskeletal: There are no aggressive appearing lytic or blastic lesions noted in the visualized portions of the skeleton.  IMPRESSION: 1. No acute findings in the abdomen or pelvis to account for the patient's symptoms. 2. Normal appendix. 3. Unusual heterogeneous mass-like enlargement  of the lower uterine segment and cervical region. The possibility of cervical neoplasm should be considered, although some of this appearance could be accounted for by multiple small Nabothian cysts. In addition, there is a large amount of fluid or soft tissue in the endometrial canal, with the endometrial stripe measuring approximately 22 mm in thickness. Further evaluation with nonemergent pelvic ultrasound is recommended in the near future to better characterize these findings. 4. Low-attenuation lesion associated with the lower right lateral vaginal wall or labia, favored to represent a gynecologic cyst such as a Bartholin's gland cyst.   Electronically Signed   By: Vinnie Langton M.D.   On: 03/12/2015 19:01    Scheduled Meds: . acetaminophen  650 mg Oral Once  . pantoprazole  40 mg Oral QHS  . sodium chloride  3 mL Intravenous Q12H   Continuous Infusions:   Principal Problem:   Symptomatic anemia Active Problems:   Thickened endometrium   Menorrhagia   Microcytic red blood cells   Abdominal pain    Time spent: 30 minutes.     [facility] Pager  [phone]. If 7PM-7AM, please contact night-coverage at [email or web address], password [facility] 03/14/2015, 5:51 PM  LOS: 2 days

## 2015-03-14 NOTE — Progress Notes (Signed)
Pt continues to have lower abdominal, low back aching unrelieved with pain meds. Pt up in chair, heat applied for comfort. Dr Karleen Hampshire aware, meds adjusted

## 2015-03-14 NOTE — Progress Notes (Signed)
I was asked to consult on this patient.  I have reviewed her notes and imaging. I will be available to see her in the morning.  She will need outpatient endometrial sampling. She did have a normal pap smear in 2014 with negative HR HPV.

## 2015-03-15 DIAGNOSIS — R1084 Generalized abdominal pain: Secondary | ICD-10-CM

## 2015-03-15 DIAGNOSIS — D500 Iron deficiency anemia secondary to blood loss (chronic): Secondary | ICD-10-CM

## 2015-03-15 LAB — CBC
HCT: 34.2 % — ABNORMAL LOW (ref 36.0–46.0)
Hemoglobin: 10.5 g/dL — ABNORMAL LOW (ref 12.0–15.0)
MCH: 19.6 pg — ABNORMAL LOW (ref 26.0–34.0)
MCHC: 30.7 g/dL (ref 30.0–36.0)
MCV: 63.7 fL — ABNORMAL LOW (ref 78.0–100.0)
Platelets: 395 10*3/uL (ref 150–400)
RBC: 5.37 MIL/uL — AB (ref 3.87–5.11)
RDW: 22.4 % — ABNORMAL HIGH (ref 11.5–15.5)
WBC: 6.9 10*3/uL (ref 4.0–10.5)

## 2015-03-15 LAB — BASIC METABOLIC PANEL
Anion gap: 10 (ref 5–15)
BUN: 5 mg/dL — AB (ref 6–20)
CHLORIDE: 101 mmol/L (ref 101–111)
CO2: 24 mmol/L (ref 22–32)
Calcium: 9.4 mg/dL (ref 8.9–10.3)
Creatinine, Ser: 0.74 mg/dL (ref 0.44–1.00)
GFR calc Af Amer: >60 mL/min (ref >60)
Glucose, Bld: 86 mg/dL (ref 70–99)
Potassium: 4.4 mmol/L (ref 3.5–5.1)
Sodium: 135 mmol/L (ref 135–145)

## 2015-03-15 LAB — LIPASE, BLOOD: LIPASE: 21 U/L — AB (ref 22–51)

## 2015-03-15 MED ORDER — NICOTINE 7 MG/24HR TD PT24
7.0000 mg | MEDICATED_PATCH | Freq: Every day | TRANSDERMAL | Status: DC
  Administered 2015-03-16: 7 mg via TRANSDERMAL
  Filled 2015-03-15 (×2): qty 1

## 2015-03-15 MED ORDER — CYCLOBENZAPRINE HCL 10 MG PO TABS: 10.0000 mg | ORAL_TABLET | Freq: Three times a day (TID) | ORAL | Status: DC | PRN

## 2015-03-15 MED ORDER — HYDROMORPHONE HCL 1 MG/ML IJ SOLN
2.0000 mg | INTRAMUSCULAR | Status: DC | PRN
  Administered 2015-03-15 – 2015-03-16 (×6): 2 mg via INTRAVENOUS
  Filled 2015-03-15 (×6): qty 2

## 2015-03-15 MED ORDER — HYDROMORPHONE HCL 1 MG/ML IJ SOLN: 1.0000 mg | INTRAMUSCULAR | Status: DC | PRN

## 2015-03-15 MED ORDER — FERROUS SULFATE 325 (65 FE) MG PO TABS
325.0000 mg | ORAL_TABLET | Freq: Two times a day (BID) | ORAL | Status: DC
  Administered 2015-03-15 – 2015-03-16 (×3): 325 mg via ORAL
  Filled 2015-03-15 (×4): qty 1
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
## 2015-03-15 NOTE — Progress Notes (Signed)
[facility] PROGRESS NOTE  Autumn Gordon VQM:[id number] DOB: [birth year] DOA: 03/12/2015 PCP: Simona Huh, MD Interim summary: 44 y.o. Female with h/o fibromyalgia, hypertension comes in for abdominal pain. She was found to have hemoglobin around 7.  Assessment/Plan: 1. Symptomatic anemia: S/p 2 units of prbc transfusion and repeat H&H is 9.5. She denies any bleeding currently. Her pelvic ultrasound showed a  Probable collapsing physiologic cyst in the right ovary and nomal left ovary.  Trilaminar endometrial stripe appearance typical for imaging at the mid cycle.  Patient reports her abdominal pain is not well controlled. Requested ob gyn consult for recommendations,  Initially increased her morphine and oxycodone,but since her pain is not well controlled, we had to switch to IV dilaudid in addition to oxycodone. Gyn consulted and recommendations given. She is not nauseated or vomiting. She is able to tolerate regular diet.  She is having regular bowel movements. Her CT abdomen and pelvis does not show any intraabdominal issues. There is no diverticulitis and her pain is in the back today. She was ordered flexiril and to be monitored.  Lipase ordered for further evaluation.   Epigastric pain and aytpical chest pain: improved.   Iron deficiency anemia: iron supplements ordered.    Abdominal pain: see above, probably from the fibroid uterus and right ovarian cyst, abdominal etiology ruled out.  Monitor on pain meds. She is afebrile , no leukocytosis, no diarrhea,no vomiting  Or nausea.   Code Status: full code.  Family Communication: none at bedside. Disposition Plan: pending.    Consultants:  Dr Kennon Rounds  Procedures:  CT abdomen a nd pelvis.   Antibiotics: none HPI/Subjective: Her abdominal pain not well controlled. No nausea or vomiting.   Objective: Filed Vitals:   03/15/15 0435  BP: 149/87  Pulse: 79  Temp: 97.4 F (36.3 C)  Resp: 18    Intake/Output  Summary (Last 24 hours) at 03/15/15 1217 Last data filed at 03/15/15 0842  Gross per 24 hour  Intake   1420 ml  Output      0 ml  Net   1420 ml   Filed Weights   03/12/15 2313 03/14/15 0522 03/15/15 0435  Weight: 78 kg (171 lb 15.3 oz) 79.742 kg (175 lb 12.8 oz) 79.107 kg (174 lb 6.4 oz)    Exam:   General:  Alert afebrile comfortable.   Cardiovascular: s1s2  Respiratory: ctab  Abdomen: soft  Mild Tenderness in the right lower quadrant,   non distended bowel sounds hears.   Musculoskeletal: no pedal edema.   Data Reviewed: Basic Metabolic Panel:  Recent Labs Lab 03/12/15 1331 03/12/15 2045 03/13/15 0600 03/15/15 0650  NA 137  --  138 135  K 3.8  --  4.1 4.4  CL 105  --  106 101  CO2 24  --  22 24  GLUCOSE 86  --  74 86  BUN <5*  --  6 5*  CREATININE 0.63  --  0.77 0.74  CALCIUM 9.1  --  9.1 9.4  MG  --  2.1  --   --    Liver Function Tests:  Recent Labs Lab 03/12/15 1331 03/13/15 0600  AST 21 21  ALT 13 13  ALKPHOS 64 59  BILITOT 0.3 0.8  PROT 7.6 7.1  ALBUMIN 3.9 3.7   No results for input(s): LIPASE, AMYLASE in the last 168 hours. No results for input(s): AMMONIA in the last 168 hours. CBC:  Recent Labs Lab 03/12/15 1331 03/12/15 2045 03/13/15 0600 03/14/15  1701 03/15/15 0650  WBC 5.2 5.4 5.1  --  6.9  NEUTROABS 2.4  --   --   --   --   HGB 7.2* 7.0* 9.5* 9.5* 10.5*  HCT 25.2* 24.2* 31.5* 31.0* 34.2*  MCV 59.2* 59.0* 63.8*  --  63.7*  PLT 511* 495* 445*  --  395   Cardiac Enzymes:  Recent Labs Lab 03/12/15 1331  TROPONINI <0.03   BNP (last 3 results)  Recent Labs  03/12/15 1331  BNP 16.7    ProBNP (last 3 results) No results for input(s): PROBNP in the last 8760 hours.  CBG: No results for input(s): GLUCAP in the last 168 hours.  Recent Results (from the past 240 hour(s))  Wet prep, genital     Status: Abnormal   Collection Time: 03/12/15  4:35 PM  Result Value Ref Range Status   Yeast Wet Prep HPF POC NONE SEEN  NONE SEEN Final   Trich, Wet Prep NONE SEEN NONE SEEN Final   Clue Cells Wet Prep HPF POC FEW (A) NONE SEEN Final   WBC, Wet Prep HPF POC FEW (A) NONE SEEN Final     Studies: US Transvaginal Non-ob  03-20-2015   CLINICAL DATA:  Persistent pelvic pain. Right-sided pain worsening today. History of uterine cancer, anemia. LMP 02/14/2015.  EXAM: ULTRASOUND PELVIS TRANSVAGINAL  TECHNIQUE: Transvaginal ultrasound examination of the pelvis was performed including evaluation of the uterus, ovaries, adnexal regions, and pelvic cul-de-sac.  COMPARISON:  03/13/2015  FINDINGS: Uterus  Measurements: Golden Circle least 10.8 x 6.2 x 6.4 cm. Mildly heterogeneous echotexture. Small posterior hypoechoic areas measure 1.6 x 1.2 x 1.3 cm and 1.8 x 1.5 x 1.4 cm consistent with fibroids.  Endometrium  Thickness: 19 mm in thickness. Focal hyperechoic abnormalities identified within the fundal endometrium measuring 1.5 x 1.2 x 1.1 cm.  Right ovary  Measurements: 3.3 x 1.9 x 2.6 cm. Small follicles are present. Color flow is noted within the ovary.  Left ovary  Measurements: And 2.6 x 2.0 x 1.8 cm. Normal appearance/no adnexal mass. Color flow is noted.  Other findings:  No free fluid  IMPRESSION: 1. Enlarged uterus containing at least 2 discrete fibroids. 2. Thickened endometrium with focal hyperechoic region in the fundal region raising the question of discrete lesion. Follow-up pelvic ultrasound is suggested for a 6 weeks. Timing of this exam is recommended for the week following the patient's menstrual cycle to determine if this lesion persists. 3. Normal appearance of the ovaries. No adnexal mass. No evidence for ovarian torsion.   Electronically Signed   By: Nolon Nations M.D.   On: 03/20/15 17:31    Scheduled Meds: . acetaminophen  650 mg Oral Once  . ferrous sulfate  325 mg Oral BID WC  . nicotine  7 mg Transdermal Daily  . pantoprazole  40 mg Oral QHS  . sodium chloride  3 mL Intravenous Q12H   Continuous Infusions:    Principal Problem:   Symptomatic anemia Active Problems:   Thickened endometrium   Menorrhagia   Microcytic red blood cells   Abdominal pain    Time spent: 30 minutes.     [facility] Pager [phone]. If 7PM-7AM, please contact night-coverage at [email or web address], password [facility] 03/15/2015, 12:17 PM  LOS: 3 days

## 2015-03-15 NOTE — Discharge Instructions (Signed)

## 2015-03-16 MED ORDER — PANTOPRAZOLE SODIUM 40 MG PO TBEC: 40.0000 mg | DELAYED_RELEASE_TABLET | Freq: Every day | ORAL | Status: DC

## 2015-03-16 MED ORDER — FERROUS SULFATE 325 (65 FE) MG PO TABS: 325.0000 mg | ORAL_TABLET | Freq: Two times a day (BID) | ORAL | Status: DC

## 2015-03-16 MED ORDER — ONDANSETRON HCL 4 MG PO TABS: 4.0000 mg | ORAL_TABLET | Freq: Four times a day (QID) | ORAL | Status: DC | PRN

## 2015-03-16 MED ORDER — HYDROCODONE-ACETAMINOPHEN 5-325 MG PO TABS: 1.0000 | ORAL_TABLET | Freq: Four times a day (QID) | ORAL | Status: DC | PRN

## 2015-03-16 NOTE — Discharge Summary (Signed)
Physician Discharge Summary  Autumn Gordon HUD:[id number] DOB: 1971/05/04 DOA: 03/12/2015  PCP: Simona Huh, MD  Admit date: 03/12/2015 Discharge date: 03/16/2015  Time spent: 30 minutes  Recommendations for Outpatient Follow-up:  1. Follow up with PCP in one week.  2. Follow up with gyn as recommended.   Discharge Diagnoses:  Principal Problem:   Symptomatic anemia Active Problems:   Thickened endometrium   Menorrhagia   Microcytic red blood cells   Abdominal pain   Discharge Condition: improved.   Diet recommendation: regular  Filed Weights   03/14/15 0522 03/15/15 0435 03/16/15 0549  Weight: 79.742 kg (175 lb 12.8 oz) 79.107 kg (174 lb 6.4 oz) 79.878 kg (176 lb 1.6 oz)    History of present illness:  44 y.o. Female with h/o fibromyalgia, hypertension comes in for abdominal pain. She was found to have hemoglobin around 7.   Hospital Course:  1. Symptomatic anemia: S/p 2 units of prbc transfusion and repeat H&H is 9.5. She denies any bleeding currently. Her pelvic ultrasound showed a Probable collapsing physiologic cyst in the right ovary and nomal left ovary. Trilaminar endometrial stripe appearance typical for imaging at the mid cycle. Asher abdominal pain is not well controlled, Requested ob gyn consult for recommendations, Initially increased her morphine and oxycodone,but since her pain is not well controlled, we had to switch to IV dilaudid in addition to oxycodone. Gyn consulted and recommendations given. She is not nauseated or vomiting. She is able to tolerate regular diet. She is having regular bowel movements. Her CT abdomen and pelvis does not show any intraabdominal issues. There is no diverticulitis and her pain is in the back today.  Lipase is normal. On discharge her symptoms improved and she was discharged to follow up with gyn as recommended. Marland Kitchen   Epigastric pain and aytpical chest pain: resolved. .   Iron deficiency anemia: iron supplements  ordered.    Abdominal pain: see above, probably from the fibroid uterus and right ovarian cyst, abdominal etiology ruled out.  . She is afebrile , no leukocytosis, no diarrhea,no vomiting Or nausea  Procedures:  none  Consultations: gyn  Discharge Exam: Filed Vitals:   03/16/15 1329  BP: 119/81  Pulse: 69  Temp: 98.2 F (36.8 C)  Resp: 18    General: alert afebrile comfortable Cardiovascular: s1s2 Respiratory: ctab  Discharge Instructions   Discharge Instructions    Diet general    Complete by:  As directed      Discharge instructions    Complete by:  As directed   Follow up with PCP in one week.  Follow up with gynecologist as recommended.          Current Discharge Medication List    START taking these medications   Details  ferrous sulfate 325 (65 FE) MG tablet Take 1 tablet (325 mg total) by mouth 2 (two) times daily with a meal. Qty: 90 tablet, Refills: 3    ondansetron (ZOFRAN) 4 MG tablet Take 1 tablet (4 mg total) by mouth every 6 (six) hours as needed for nausea. Qty: 20 tablet, Refills: 0    pantoprazole (PROTONIX) 40 MG tablet Take 1 tablet (40 mg total) by mouth at bedtime. Qty: 30 tablet, Refills: 0      CONTINUE these medications which have CHANGED   Details  HYDROcodone-acetaminophen (NORCO) 5-325 MG per tablet Take 1 tablet by mouth every 6 (six) hours as needed for moderate pain. Qty: 15 tablet, Refills: 0      CONTINUE  these medications which have NOT CHANGED   Details  diphenhydrAMINE (BENADRYL) 25 MG tablet Take 25 mg by mouth every 6 (six) hours as needed for allergies or sleep.    hydrocortisone cream 1 % Apply 1 application topically as needed for itching (for face).    cyclobenzaprine (FLEXERIL) 10 MG tablet Take 1 tablet (10 mg total) by mouth 2 (two) times daily as needed for muscle spasms. Qty: 20 tablet, Refills: 0      STOP taking these medications     ibuprofen (ADVIL,MOTRIN) 200 MG tablet      Iron TABS         Allergies  Allergen Reactions  . Shellfish Allergy Anaphylaxis  . Tramadol Hives   Follow-up Information    Follow up with [facility] In 3 weeks.   Specialty:  Obstetrics and Gynecology   Why:  Hosptial follow-up, probable endometrial biopsy   Contact information:   [place] [phone]      Follow up with Simona Huh, MD. Schedule an appointment as soon as possible for a visit in 1 week.   Specialty:  Family Medicine   Contact information:   [address] [place] [address] [phone]        The results of significant diagnostics from this hospitalization (including imaging, microbiology, ancillary and laboratory) are listed below for reference.    Significant Diagnostic Studies: Dg Chest 2 View  03/12/2015   CLINICAL DATA:  Leg swelling.  Abdominal pain  EXAM: CHEST  2 VIEW  COMPARISON:  03/29/2014  FINDINGS: The heart size and mediastinal contours are within normal limits. Both lungs are clear. The visualized skeletal structures are unremarkable.  IMPRESSION: No active cardiopulmonary disease.   Electronically Signed   By: Franchot Gallo M.D.   On: 03/12/2015 16:28   US Transvaginal Non-ob  03/14/2015   CLINICAL DATA:  Persistent pelvic pain. Right-sided pain worsening today. History of uterine cancer, anemia. LMP 02/14/2015.  EXAM: ULTRASOUND PELVIS TRANSVAGINAL  TECHNIQUE: Transvaginal ultrasound examination of the pelvis was performed including evaluation of the uterus, ovaries, adnexal regions, and pelvic cul-de-sac.  COMPARISON:  03/13/2015  FINDINGS: Uterus  Measurements: Golden Circle least 10.8 x 6.2 x 6.4 cm. Mildly heterogeneous echotexture. Small posterior hypoechoic areas measure 1.6 x 1.2 x 1.3 cm and 1.8 x 1.5 x 1.4 cm consistent with fibroids.  Endometrium  Thickness: 19 mm in thickness. Focal hyperechoic abnormalities identified within the fundal endometrium measuring 1.5 x 1.2 x 1.1 cm.   Right ovary  Measurements: 3.3 x 1.9 x 2.6 cm. Small follicles are present. Color flow is noted within the ovary.  Left ovary  Measurements: And 2.6 x 2.0 x 1.8 cm. Normal appearance/no adnexal mass. Color flow is noted.  Other findings:  No free fluid  IMPRESSION: 1. Enlarged uterus containing at least 2 discrete fibroids. 2. Thickened endometrium with focal hyperechoic region in the fundal region raising the question of discrete lesion. Follow-up pelvic ultrasound is suggested for a 6 weeks. Timing of this exam is recommended for the week following the patient's menstrual cycle to determine if this lesion persists. 3. Normal appearance of the ovaries. No adnexal mass. No evidence for ovarian torsion.   Electronically Signed   By: Nolon Nations M.D.   On: 03/14/2015 17:31   US Transvaginal Non-ob  03/13/2015   CLINICAL DATA:  Thickened endometrial stripe on CT, low abdominal pain for several days. Abdominal fullness.  EXAM: TRANSABDOMINAL AND  TRANSVAGINAL ULTRASOUND OF PELVIS  TECHNIQUE: Both transabdominal and transvaginal ultrasound examinations of the pelvis were performed. Transabdominal technique was performed for global imaging of the pelvis including uterus, ovaries, adnexal regions, and pelvic cul-de-sac. It was necessary to proceed with endovaginal exam following the transabdominal exam to visualize the endometrium and ovaries.  COMPARISON:  CT abdomen/ pelvis 03/12/2015  FINDINGS: Uterus  Measurements: 10.5 x 7.2 x 6.3 cm. Anteverted, anteflexed. Posterior uterine body/ fundal intramural fibroid measures 1.8 x 1.6 x 1.0 cm. Posterior uterine body intramural fibroid measures 1.6 x 1.5 x 1.3 cm.  Endometrium  Thickness: Mildly inhomogeneous and trilaminar in appearance. Possible echogenic filling defect at the fundus versus endometrial inhomogeneity measuring 1.5 x 1.2 by 2.0 cm.  Right ovary  Measurements: 3.4 x 2.2 x 1.9 cm. Probable collapsing physiologic cyst measuring 1.7 x 1.6 x 1.5 cm  Left  ovary  Measurements: 2.8 x 2.0 x 1.8 cm. Normal appearance/no adnexal mass.  Other findings  No free fluid.  IMPRESSION: Trilaminar endometrial stripe appearance typical for imaging at the mid cycle. Possible echogenic filling defect at the uterine fundal endometrial canal versus endometrial inhomogeneity. Followup pelvic ultrasound is recommended in 4-6 weeks during the week specifically following the patient's menses, in order to evaluate for possible polyp or other focal abnormality such as hyperplasia or neoplasia versus artifactual thickness.  Fibroid uterus as above.   Electronically Signed   By: Conchita Paris M.D.   On: 03/13/2015 09:06   US Pelvis Complete  03/13/2015   CLINICAL DATA:  Thickened endometrial stripe on CT, low abdominal pain for several days. Abdominal fullness.  EXAM: TRANSABDOMINAL AND TRANSVAGINAL ULTRASOUND OF PELVIS  TECHNIQUE: Both transabdominal and transvaginal ultrasound examinations of the pelvis were performed. Transabdominal technique was performed for global imaging of the pelvis including uterus, ovaries, adnexal regions, and pelvic cul-de-sac. It was necessary to proceed with endovaginal exam following the transabdominal exam to visualize the endometrium and ovaries.  COMPARISON:  CT abdomen/ pelvis 03/12/2015  FINDINGS: Uterus  Measurements: 10.5 x 7.2 x 6.3 cm. Anteverted, anteflexed. Posterior uterine body/ fundal intramural fibroid measures 1.8 x 1.6 x 1.0 cm. Posterior uterine body intramural fibroid measures 1.6 x 1.5 x 1.3 cm.  Endometrium  Thickness: Mildly inhomogeneous and trilaminar in appearance. Possible echogenic filling defect at the fundus versus endometrial inhomogeneity measuring 1.5 x 1.2 by 2.0 cm.  Right ovary  Measurements: 3.4 x 2.2 x 1.9 cm. Probable collapsing physiologic cyst measuring 1.7 x 1.6 x 1.5 cm  Left ovary  Measurements: 2.8 x 2.0 x 1.8 cm. Normal appearance/no adnexal mass.  Other findings  No free fluid.  IMPRESSION: Trilaminar  endometrial stripe appearance typical for imaging at the mid cycle. Possible echogenic filling defect at the uterine fundal endometrial canal versus endometrial inhomogeneity. Followup pelvic ultrasound is recommended in 4-6 weeks during the week specifically following the patient's menses, in order to evaluate for possible polyp or other focal abnormality such as hyperplasia or neoplasia versus artifactual thickness.  Fibroid uterus as above.   Electronically Signed   By: Conchita Paris M.D.   On: 03/13/2015 09:06   Ct Abdomen Pelvis W Contrast  03/12/2015   CLINICAL DATA:  44 year old female with left upper head and lower abdominal pain for the past several days, with some associated abdominal fullness. Possible history of gynecologic malignancy.  EXAM: CT ABDOMEN AND PELVIS WITH CONTRAST  TECHNIQUE: Multidetector CT imaging of the abdomen and pelvis was performed using the standard protocol following bolus administration of  intravenous contrast.  CONTRAST:  117mL OMNIPAQUE IOHEXOL 300 MG/ML  SOLN  COMPARISON:  CT of the chest, abdomen and pelvis 03/29/2014.  FINDINGS: Lower chest:  Mild scarring in the lung bases bilaterally.  Hepatobiliary: No cystic or solid hepatic lesions. No intra or extrahepatic biliary ductal dilatation. Gallbladder is normal in appearance.  Pancreas: Unremarkable.  Spleen: Unremarkable.  Adrenals/Urinary Tract: Well-defined 1.7 cm low-attenuation lesion in the lower pole of the right kidney is unchanged compared to the prior study, compatible with a simple cyst. Left kidney and bilateral adrenal glands are normal in appearance. No hydroureteronephrosis. Urinary bladder is normal in appearance.  Stomach/Bowel: The appearance of the stomach is normal. No pathologic dilatation of small bowel or colon. Normal appendix.  Vascular/Lymphatic: No significant atherosclerotic disease, aneurysm or dissection identified in the abdominal or pelvic vasculature. No lymphadenopathy noted in the  abdomen or pelvis.  Reproductive: 12 mm enhancing lesion in the posterior aspect of the uterine body likely to represent a small fibroid. Fluid or soft tissue in the endometrial canal measuring up to 22 mm in thickness. Very heterogeneous appearing enlarged lower uterine segment and cervical region, somewhat mass-like in appearance, best appreciated on image 69 of series 2. 2.5 x 2.1 x 1.8 cm low-attenuation lesion in the lower right vaginal wall or labia (image 86 of series 2).  Other: No significant volume of ascites.  No pneumoperitoneum.  Musculoskeletal: There are no aggressive appearing lytic or blastic lesions noted in the visualized portions of the skeleton.  IMPRESSION: 1. No acute findings in the abdomen or pelvis to account for the patient's symptoms. 2. Normal appendix. 3. Unusual heterogeneous mass-like enlargement of the lower uterine segment and cervical region. The possibility of cervical neoplasm should be considered, although some of this appearance could be accounted for by multiple small Nabothian cysts. In addition, there is a large amount of fluid or soft tissue in the endometrial canal, with the endometrial stripe measuring approximately 22 mm in thickness. Further evaluation with nonemergent pelvic ultrasound is recommended in the near future to better characterize these findings. 4. Low-attenuation lesion associated with the lower right lateral vaginal wall or labia, favored to represent a gynecologic cyst such as a Bartholin's gland cyst.   Electronically Signed   By: Vinnie Langton M.D.   On: 03/12/2015 19:01    Microbiology: Recent Results (from the past 240 hour(s))  Wet prep, genital     Status: Abnormal   Collection Time: 03/12/15  4:35 PM  Result Value Ref Range Status   Yeast Wet Prep HPF POC NONE SEEN NONE SEEN Final   Trich, Wet Prep NONE SEEN NONE SEEN Final   Clue Cells Wet Prep HPF POC FEW (A) NONE SEEN Final   WBC, Wet Prep HPF POC FEW (A) NONE SEEN Final      Labs: Basic Metabolic Panel:  Recent Labs Lab 03/12/15 1331 03/12/15 2045 03/13/15 0600 03/15/15 0650  NA 137  --  138 135  K 3.8  --  4.1 4.4  CL 105  --  106 101  CO2 24  --  22 24  GLUCOSE 86  --  74 86  BUN <5*  --  6 5*  CREATININE 0.63  --  0.77 0.74  CALCIUM 9.1  --  9.1 9.4  MG  --  2.1  --   --    Liver Function Tests:  Recent Labs Lab 03/12/15 1331 03/13/15 0600  AST 21 21  ALT 13 13  ALKPHOS 64  63  BILITOT 0.3 0.8  PROT 7.6 7.1  ALBUMIN 3.9 3.7    Recent Labs Lab 03/15/15 0650  LIPASE 21*   No results for input(s): AMMONIA in the last 168 hours. CBC:  Recent Labs Lab 03/12/15 1331 03/12/15 2045 03/13/15 0600 03/14/15 1701 03/15/15 0650  WBC 5.2 5.4 5.1  --  6.9  NEUTROABS 2.4  --   --   --   --   HGB 7.2* 7.0* 9.5* 9.5* 10.5*  HCT 25.2* 24.2* 31.5* 31.0* 34.2*  MCV 59.2* 59.0* 63.8*  --  63.7*  PLT 511* 495* 445*  --  395   Cardiac Enzymes:  Recent Labs Lab 03/12/15 1331  TROPONINI <0.03   BNP: BNP (last 3 results)  Recent Labs  03/12/15 1331  BNP 16.7    ProBNP (last 3 results) No results for input(s): PROBNP in the last 8760 hours.  CBG: No results for input(s): GLUCAP in the last 168 hours.     SignedHosie Poisson  [facility] 03/16/2015, 4:38 PM

## 2015-03-16 NOTE — Progress Notes (Signed)
Removed IVs x 2; removed telemetry and contacted [facility] that pt is going to be discharged.  Explained discharge instructions to pt and she had no further questions at this time.  Pt in no acute distress and was discharged to home.

## 2015-03-18 ENCOUNTER — Telehealth: Payer: Self-pay | Admitting: General Practice

## 2015-03-18 NOTE — Telephone Encounter (Signed)
Patient called and left message stating she has a follow up appt scheduled for 6/1 after her surgery. States she needs to go back to work sooner than that and wants to know if she can get a note to return. Per chart review patient was sent to ER by PCP for possible blood transfusion due to low hgb and patient was admitted. Called patient and asked if her job was needing a note to return or FMLA. Patient states fmla. Told patient since she was sent by her PCP and we have not seen her since 2014 the note would need to come from them. Patient verbalized understanding and had no questions. Reminded patient of appt in our office on 6/1 @ 145. Patient verbalized understanding

## 2015-04-15 ENCOUNTER — Ambulatory Visit (INDEPENDENT_AMBULATORY_CARE_PROVIDER_SITE_OTHER): Payer: Managed Care, Other (non HMO) | Admitting: Obstetrics and Gynecology

## 2015-04-15 ENCOUNTER — Other Ambulatory Visit (HOSPITAL_COMMUNITY)
Admission: RE | Admit: 2015-04-15 | Discharge: 2015-04-15 | Disposition: A | Discharge status: Home / Self Care | Payer: Managed Care, Other (non HMO) | Source: Ambulatory Visit | Attending: Obstetrics and Gynecology | Admitting: Obstetrics and Gynecology

## 2015-04-15 ENCOUNTER — Encounter: Payer: Self-pay | Admitting: Obstetrics and Gynecology

## 2015-04-15 VITALS — BP 152/94 | HR 85 | Wt 181.0 lb

## 2015-04-15 DIAGNOSIS — Z01812 Encounter for preprocedural laboratory examination: Secondary | ICD-10-CM | POA: Diagnosis not present

## 2015-04-15 DIAGNOSIS — N938 Other specified abnormal uterine and vaginal bleeding: Secondary | ICD-10-CM | POA: Diagnosis not present

## 2015-04-15 DIAGNOSIS — Z3202 Encounter for pregnancy test, result negative: Secondary | ICD-10-CM

## 2015-04-15 LAB — POCT PREGNANCY, URINE: Preg Test, Ur: NEGATIVE

## 2015-04-15 MED ORDER — MEGESTROL ACETATE 20 MG PO TABS: 20.0000 mg | ORAL_TABLET | Freq: Two times a day (BID) | ORAL | Status: DC
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
## 2015-04-15 NOTE — Patient Instructions (Signed)
Levonorgestrel intrauterine device (IUD) What is this medicine? LEVONORGESTREL IUD (LEE voe nor jes trel) is a contraceptive (birth control) device. The device is placed inside the uterus by a healthcare professional. It is used to prevent pregnancy and can also be used to treat heavy bleeding that occurs during your period. Depending on the device, it can be used for 3 to 5 years. This medicine may be used for other purposes; ask your health care provider or pharmacist if you have questions. COMMON BRAND NAME(S): LILETTA, Mirena, Skyla What should I tell my health care provider before I take this medicine? They need to know if you have any of these conditions: -abnormal Pap smear -cancer of the breast, uterus, or cervix -diabetes -endometritis -genital or pelvic infection now or in the past -have more than one sexual partner or your partner has more than one partner -heart disease -history of an ectopic or tubal pregnancy -immune system problems -IUD in place -liver disease or tumor -problems with blood clots or take blood-thinners -use intravenous drugs -uterus of unusual shape -vaginal bleeding that has not been explained -an unusual or allergic reaction to levonorgestrel, other hormones, silicone, or polyethylene, medicines, foods, dyes, or preservatives -pregnant or trying to get pregnant -breast-feeding How should I use this medicine? This device is placed inside the uterus by a health care professional. Talk to your pediatrician regarding the use of this medicine in children. Special care may be needed. Overdosage: If you think you have taken too much of this medicine contact a poison control center or emergency room at once. NOTE: This medicine is only for you. Do not share this medicine with others. What if I miss a dose? This does not apply. What may interact with this medicine? Do not take this medicine with any of the following  medications: -amprenavir -bosentan -fosamprenavir This medicine may also interact with the following medications: -aprepitant -barbiturate medicines for inducing sleep or treating seizures -bexarotene -griseofulvin -medicines to treat seizures like carbamazepine, ethotoin, felbamate, oxcarbazepine, phenytoin, topiramate -modafinil -pioglitazone -rifabutin -rifampin -rifapentine -some medicines to treat HIV infection like atazanavir, indinavir, lopinavir, nelfinavir, tipranavir, ritonavir -St. John's wort -warfarin This list may not describe all possible interactions. Give your health care provider a list of all the medicines, herbs, non-prescription drugs, or dietary supplements you use. Also tell them if you smoke, drink alcohol, or use illegal drugs. Some items may interact with your medicine. What should I watch for while using this medicine? Visit your doctor or health care professional for regular check ups. See your doctor if you or your partner has sexual contact with others, becomes HIV positive, or gets a sexual transmitted disease. This product does not protect you against HIV infection (AIDS) or other sexually transmitted diseases. You can check the placement of the IUD yourself by reaching up to the top of your vagina with clean fingers to feel the threads. Do not pull on the threads. It is a good habit to check placement after each menstrual period. Call your doctor right away if you feel more of the IUD than just the threads or if you cannot feel the threads at all. The IUD may come out by itself. You may become pregnant if the device comes out. If you notice that the IUD has come out use a backup birth control method like condoms and call your health care provider. Using tampons will not change the position of the IUD and are okay to use during your period. What side effects may   I notice from receiving this medicine? Side effects that you should report to your doctor or  health care professional as soon as possible: -allergic reactions like skin rash, itching or hives, swelling of the face, lips, or tongue -fever, flu-like symptoms -genital sores -high blood pressure -no menstrual period for 6 weeks during use -pain, swelling, warmth in the leg -pelvic pain or tenderness -severe or sudden headache -signs of pregnancy -stomach cramping -sudden shortness of breath -trouble with balance, talking, or walking -unusual vaginal bleeding, discharge -yellowing of the eyes or skin Side effects that usually do not require medical attention (report to your doctor or health care professional if they continue or are bothersome): -acne -breast pain -change in sex drive or performance -changes in weight -cramping, dizziness, or faintness while the device is being inserted -headache -irregular menstrual bleeding within first 3 to 6 months of use -nausea This list may not describe all possible side effects. Call your doctor for medical advice about side effects. You may report side effects to FDA at 1-800-FDA-1088. Where should I keep my medicine? This does not apply. NOTE: This sheet is a summary. It may not cover all possible information. If you have questions about this medicine, talk to your doctor, pharmacist, or health care provider.  2015, Elsevier/Gold Standard. (2011-12-01 13:54:04)  

## 2015-04-20 ENCOUNTER — Telehealth: Payer: Self-pay

## 2015-04-20 NOTE — Telephone Encounter (Signed)
Per Dr. Elly Modena, to inform patient of negative endo bx and to follow up for depo or IUD at her convenience. Called patient and informed her of results and recommendations. Patient wishes to proceed with IUD-- asked if she could have any medication before hand as it may be painful. Informed her she may take ibuprofen prior to insertion but that no stronger medication would be given. Patient verbalized understanding and asked that she be scheduled. Message sent to admin pool to call patient with next available appointment. Advised patient call if she has not heard of appointment by end of week. Verbalized understanding. No questions or cocnerns.

## 2015-05-06 ENCOUNTER — Encounter: Payer: Self-pay | Admitting: Medical

## 2015-05-14 ENCOUNTER — Ambulatory Visit (INDEPENDENT_AMBULATORY_CARE_PROVIDER_SITE_OTHER): Payer: Managed Care, Other (non HMO) | Admitting: Medical

## 2015-05-14 DIAGNOSIS — N938 Other specified abnormal uterine and vaginal bleeding: Secondary | ICD-10-CM

## 2015-05-14 LAB — POCT PREGNANCY, URINE: Preg Test, Ur: NEGATIVE

## 2015-05-14 MED ORDER — MEDROXYPROGESTERONE ACETATE 150 MG/ML IM SUSP
150.0000 mg | Freq: Once | INTRAMUSCULAR | Status: AC
  Administered 2015-05-14: 150 mg via INTRAMUSCULAR

## 2015-05-14 NOTE — Progress Notes (Signed)
Pt came in today for IUD insertion for DUB.  Pt informed me that she did not want the IUD but she wanted the Depo Provera.  Per provider, Kerry Hough, pt has negative UPT today pt can receive Depo Provera and to continue Megace for one to two weeks.  Pt stated understanding to the recommendation.  Pt pregnancy test resulted negative.  Depo Provera administered.

## 2015-05-21 IMAGING — CT CT ABD-PELV W/ CM
1 of 4 series · 14 of 32 positions shown, 19 images · IV contrast (OMNIPAQUE 300)
Comparison: 03/05/2013

CLINICAL DATA: Right lower quadrant abdominal pain.  Menstrual
cramps with nausea and vomiting today.

CT ABDOMEN AND PELVIS WITH CONTRAST
TECHNIQUE: Multidetector CT imaging of the abdomen and pelvis was
performed following the standard protocol during bolus
administration of intravenous contrast.
Contrast: 50mL OMNIPAQUE IOHEXOL 300 MG/ML  SOLN, 100mL OMNIPAQUE
IOHEXOL 300 MG/ML  SOLN

[Series 2: abd/pel with · axial · 0.71mm/px · z∈[-486,-112]mm · 14 of 86 slices shown, 19 images]
[im 6/86  soft-tissue]
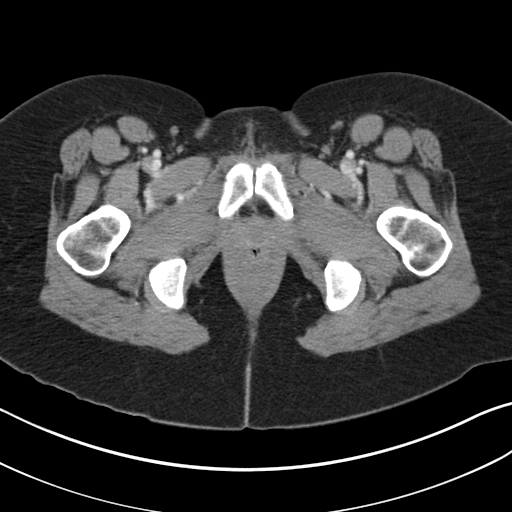
[im 6/86  bone]
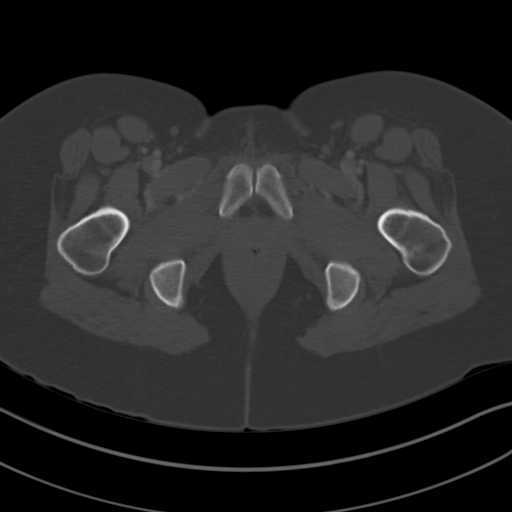
[im 11/86  soft-tissue]
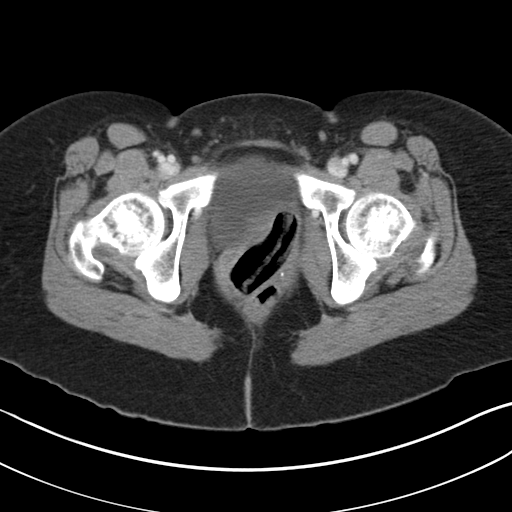
[im 21/86  soft-tissue]
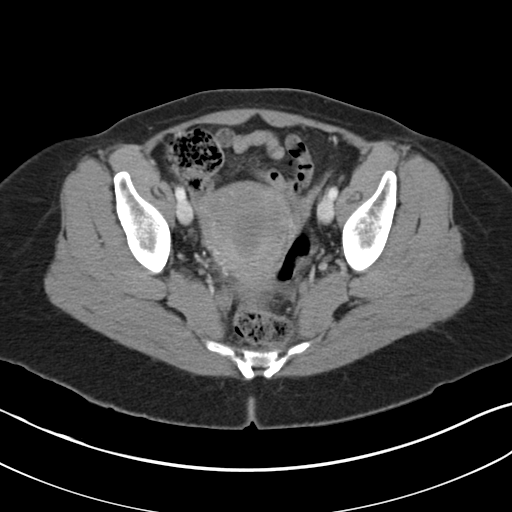
[im 26/86  soft-tissue]
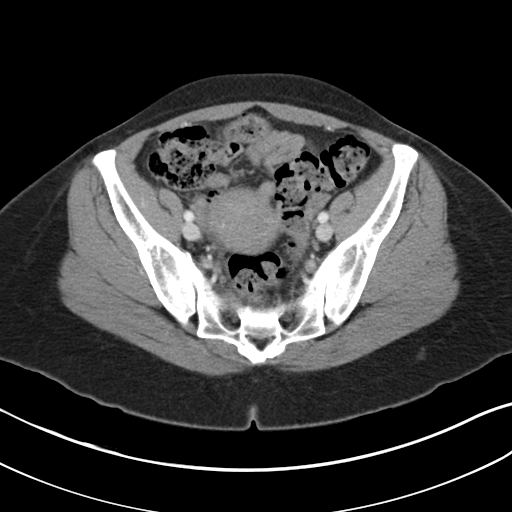
[im 31/86  soft-tissue]
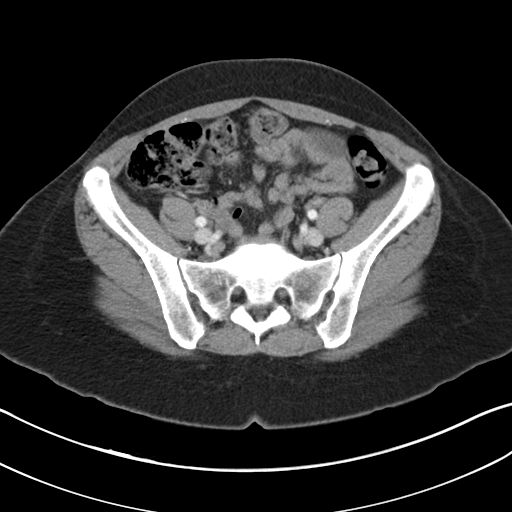
[im 36/86  soft-tissue]
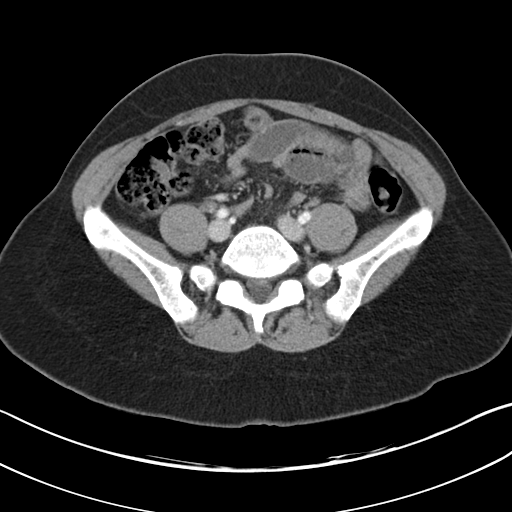
[im 46/86  soft-tissue]
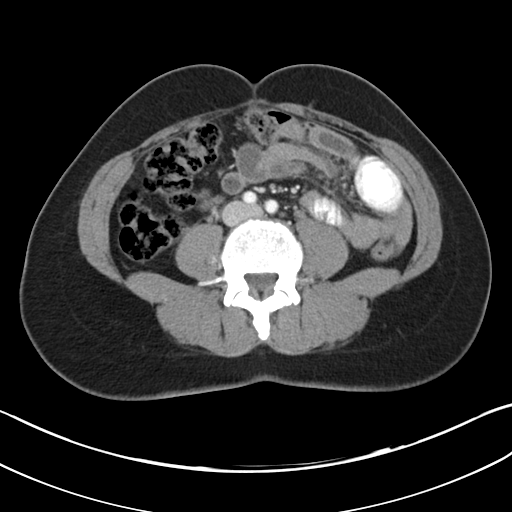
[im 51/86  soft-tissue]
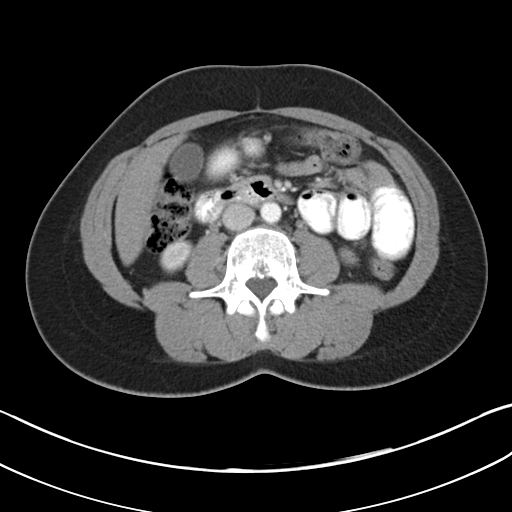
[im 56/86  soft-tissue]
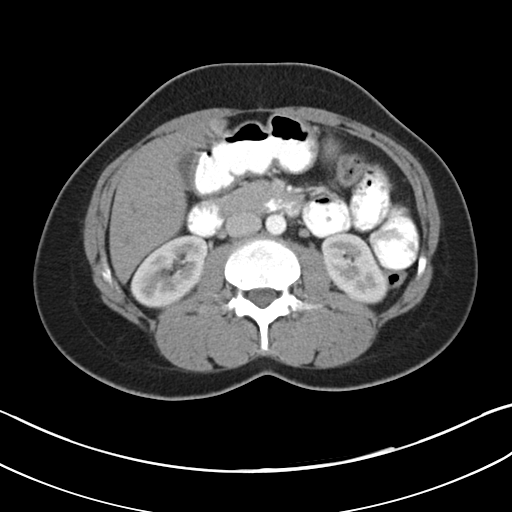
[im 56/86  bone]
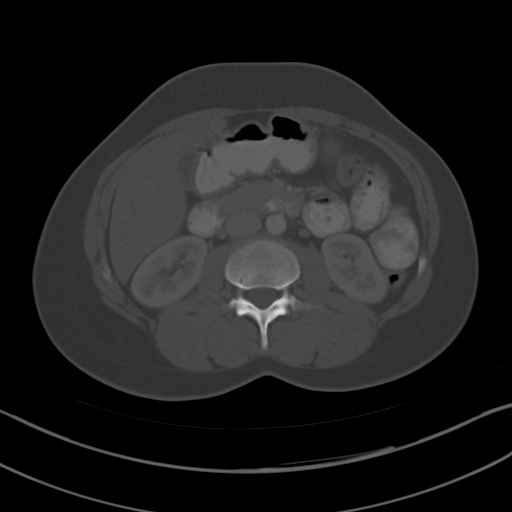
[im 61/86  soft-tissue]
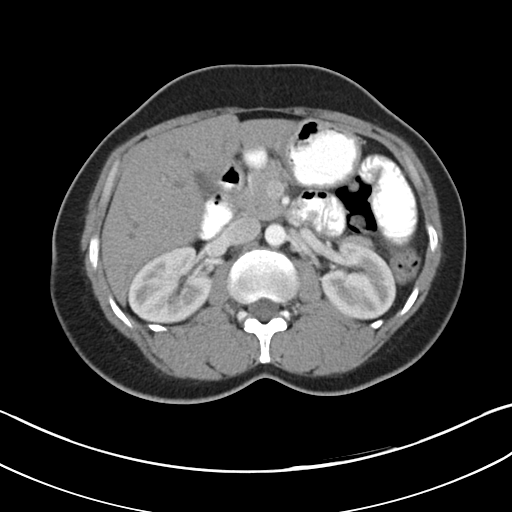
[im 66/86  soft-tissue]
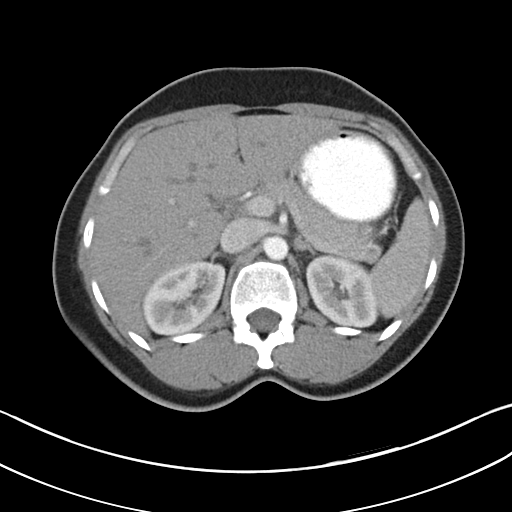
[im 66/86  lung]
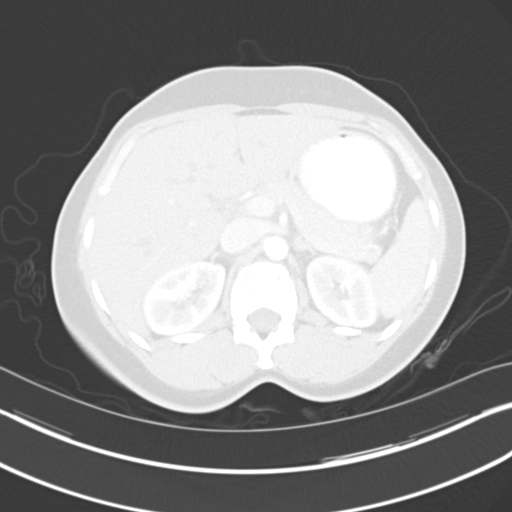
[im 71/86  lung]
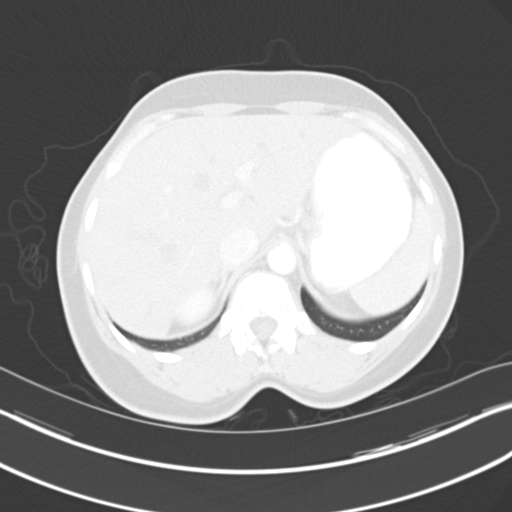
[im 76/86  soft-tissue]
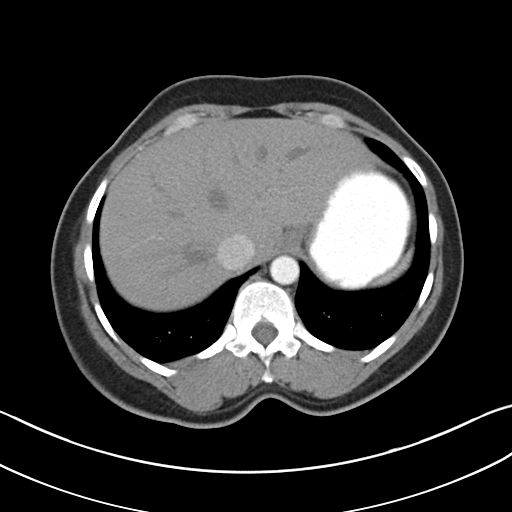
[im 76/86  lung]
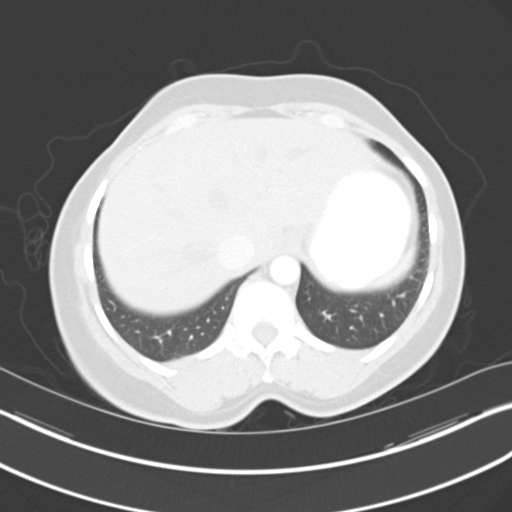
[im 81/86  soft-tissue]
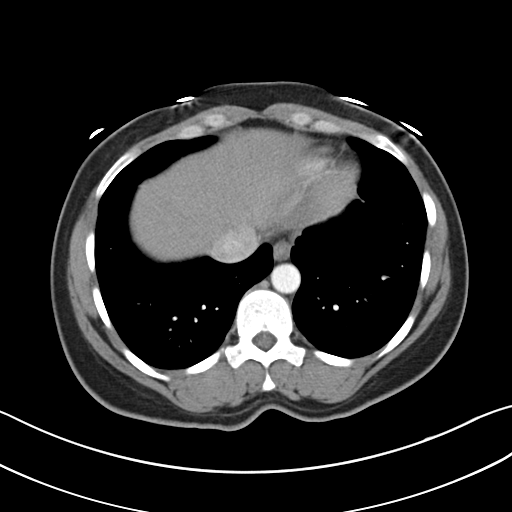
[im 81/86  lung]
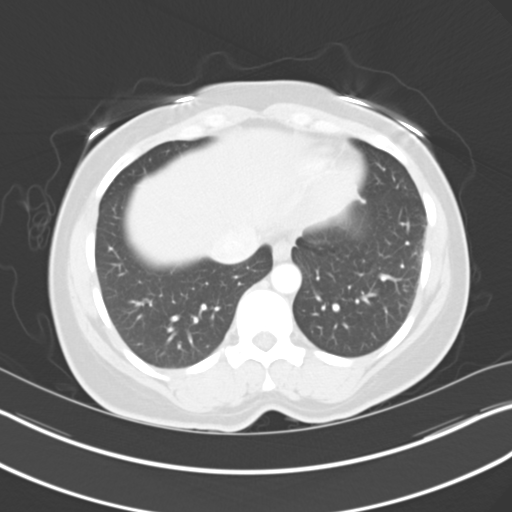

[14 of 32 positions shown; findings below may reference images not displayed]

FINDINGS: The lung bases are clear.

The liver, spleen, gallbladder, pancreas, adrenal glands, abdominal
aorta, inferior vena cava, and retroperitoneal lymph nodes are
unremarkable.  Cyst in the lower pole of the right kidney is stable
since previous study.  No evidence of solid mass or hydronephrosis
in either kidney.  The stomach, small bowel, and colon are
unremarkable.  No free air or free fluid in the abdomen.  Abdominal
wall musculature appears intact.

Pelvis:  Tampon in the vagina.  Uterus and ovaries are not
enlarged.  No free or loculated pelvic fluid collections.  Bladder
wall is not thickened.  Appendix is normal.  Stool fills the
rectosigmoid colon without evidence of diverticulitis.  No
significant pelvic lymphadenopathy.  1.9 x 3.1 cm cystic lesion to
the right of midline in the perineum may represent a bartholin
gland or Igdalis Utria cyst.  This is stable since the previous study.
Normal alignment of the lumbar vertebrae.
IMPRESSION: No acute process demonstrated in the abdomen or pelvis.  Stable
appearance since previous study.

## 2015-07-04 ENCOUNTER — Encounter (HOSPITAL_COMMUNITY): Payer: Self-pay | Admitting: *Deleted

## 2015-07-04 ENCOUNTER — Emergency Department (HOSPITAL_COMMUNITY): Payer: Managed Care, Other (non HMO)

## 2015-07-04 ENCOUNTER — Emergency Department (HOSPITAL_COMMUNITY)
Admission: EM | Admit: 2015-07-04 | Discharge: 2015-07-04 | Disposition: A | Discharge status: Home / Self Care | Payer: Managed Care, Other (non HMO) | Attending: Emergency Medicine | Admitting: Emergency Medicine

## 2015-07-04 DIAGNOSIS — R110 Nausea: Secondary | ICD-10-CM | POA: Diagnosis not present

## 2015-07-04 DIAGNOSIS — I10 Essential (primary) hypertension: Secondary | ICD-10-CM | POA: Diagnosis not present

## 2015-07-04 DIAGNOSIS — G8929 Other chronic pain: Secondary | ICD-10-CM | POA: Diagnosis not present

## 2015-07-04 DIAGNOSIS — D509 Iron deficiency anemia, unspecified: Secondary | ICD-10-CM | POA: Insufficient documentation

## 2015-07-04 DIAGNOSIS — Z8541 Personal history of malignant neoplasm of cervix uteri: Secondary | ICD-10-CM | POA: Diagnosis not present

## 2015-07-04 DIAGNOSIS — Z79899 Other long term (current) drug therapy: Secondary | ICD-10-CM | POA: Diagnosis not present

## 2015-07-04 DIAGNOSIS — Z8739 Personal history of other diseases of the musculoskeletal system and connective tissue: Secondary | ICD-10-CM | POA: Insufficient documentation

## 2015-07-04 DIAGNOSIS — R5383 Other fatigue: Secondary | ICD-10-CM | POA: Diagnosis not present

## 2015-07-04 DIAGNOSIS — R42 Dizziness and giddiness: Secondary | ICD-10-CM | POA: Insufficient documentation

## 2015-07-04 DIAGNOSIS — G4733 Obstructive sleep apnea (adult) (pediatric): Secondary | ICD-10-CM | POA: Insufficient documentation

## 2015-07-04 DIAGNOSIS — R531 Weakness: Secondary | ICD-10-CM | POA: Diagnosis not present

## 2015-07-04 DIAGNOSIS — N938 Other specified abnormal uterine and vaginal bleeding: Secondary | ICD-10-CM | POA: Diagnosis not present

## 2015-07-04 DIAGNOSIS — Z8719 Personal history of other diseases of the digestive system: Secondary | ICD-10-CM | POA: Insufficient documentation

## 2015-07-04 DIAGNOSIS — Z720 Tobacco use: Secondary | ICD-10-CM | POA: Diagnosis not present

## 2015-07-04 DIAGNOSIS — J449 Chronic obstructive pulmonary disease, unspecified: Secondary | ICD-10-CM | POA: Diagnosis not present

## 2015-07-04 DIAGNOSIS — R079 Chest pain, unspecified: Secondary | ICD-10-CM | POA: Diagnosis present

## 2015-07-04 DIAGNOSIS — N939 Abnormal uterine and vaginal bleeding, unspecified: Secondary | ICD-10-CM

## 2015-07-04 LAB — CBC
HEMATOCRIT: 33.1 % — AB (ref 36.0–46.0)
HEMOGLOBIN: 11 g/dL — AB (ref 12.0–15.0)
MCH: 25.8 pg — ABNORMAL LOW (ref 26.0–34.0)
MCHC: 33.2 g/dL (ref 30.0–36.0)
MCV: 77.5 fL — ABNORMAL LOW (ref 78.0–100.0)
Platelets: 413 10*3/uL — ABNORMAL HIGH (ref 150–400)
RBC: 4.27 MIL/uL (ref 3.87–5.11)
RDW: 15.1 % (ref 11.5–15.5)
WBC: 9.2 10*3/uL (ref 4.0–10.5)

## 2015-07-04 LAB — PREGNANCY, URINE: Preg Test, Ur: NEGATIVE

## 2015-07-04 LAB — COMPREHENSIVE METABOLIC PANEL
ALBUMIN: 3.9 g/dL (ref 3.5–5.0)
ALT: 21 U/L (ref 14–54)
AST: 32 U/L (ref 15–41)
Alkaline Phosphatase: 65 U/L (ref 38–126)
Anion gap: 10 (ref 5–15)
BUN: 13 mg/dL (ref 6–20)
CO2: 21 mmol/L — AB (ref 22–32)
CREATININE: 0.76 mg/dL (ref 0.44–1.00)
Calcium: 9.3 mg/dL (ref 8.9–10.3)
Chloride: 106 mmol/L (ref 101–111)
GFR calc Af Amer: >60 mL/min (ref >60)
GFR calc non Af Amer: >60 mL/min (ref >60)
Glucose, Bld: 97 mg/dL (ref 65–99)
POTASSIUM: 4.2 mmol/L (ref 3.5–5.1)
SODIUM: 137 mmol/L (ref 135–145)
Total Bilirubin: 0.6 mg/dL (ref 0.3–1.2)
Total Protein: 7.2 g/dL (ref 6.5–8.1)

## 2015-07-04 LAB — WET PREP, GENITAL
Clue Cells Wet Prep HPF POC: NONE SEEN
TRICH WET PREP: NONE SEEN
Yeast Wet Prep HPF POC: NONE SEEN

## 2015-07-04 LAB — URINALYSIS, ROUTINE W REFLEX MICROSCOPIC
BILIRUBIN URINE: NEGATIVE
Glucose, UA: NEGATIVE mg/dL
KETONES UR: NEGATIVE mg/dL
Leukocytes, UA: NEGATIVE
NITRITE: NEGATIVE
PH: 7.5 (ref 5.0–8.0)
Protein, ur: NEGATIVE mg/dL
SPECIFIC GRAVITY, URINE: 1.02 (ref 1.005–1.030)
Urobilinogen, UA: 0.2 mg/dL (ref 0.0–1.0)

## 2015-07-04 LAB — PROTIME-INR
INR: 1.05 (ref 0.00–1.49)
PROTHROMBIN TIME: 13.9 s (ref 11.6–15.2)

## 2015-07-04 LAB — URINE MICROSCOPIC-ADD ON

## 2015-07-04 LAB — I-STAT TROPONIN, ED: TROPONIN I, POC: 0.02 ng/mL (ref 0.00–0.08)

## 2015-07-04 MED ORDER — ONDANSETRON 4 MG PO TBDP
4.0000 mg | ORAL_TABLET | Freq: Once | ORAL | Status: AC
  Administered 2015-07-04: 4 mg via ORAL
  Filled 2015-07-04: qty 1

## 2015-07-04 MED ORDER — NAPROXEN 500 MG PO TABS: 500.0000 mg | ORAL_TABLET | Freq: Two times a day (BID) | ORAL | Status: DC

## 2015-07-04 MED ORDER — SODIUM CHLORIDE 0.9 % IV BOLUS (SEPSIS)
1000.0000 mL | Freq: Once | INTRAVENOUS | Status: AC
  Administered 2015-07-04: 1000 mL via INTRAVENOUS

## 2015-07-04 NOTE — Discharge Instructions (Signed)
Return to the ED with any concerns including fainting, bleeding and soaking more than one pad per hour, difficulty breathing, vomiting and not able to keep down liquids, decreased level of alertness/lethargy, or any other alarming symptoms

## 2015-07-04 NOTE — ED Notes (Addendum)
Pt states she needs her hemaglobin checked b/c she feels terrible and she is having chest pain.  States blood transfusion in May.  She had her hemaglobin checked yesterday, but results won't come back until Monday.  She feels nauseated (dry heaving) and is having sternal chest pain she describes as pressure.  Pt states has been passing large blood clots with her period that has lasted 3 weeks.
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
## 2015-07-04 NOTE — ED Notes (Signed)
Pt verbalizes understanding of discharge instructions. NAD. Ambulatory with steady gait. VSS.

## 2015-07-05 LAB — HIV ANTIBODY (ROUTINE TESTING W REFLEX): HIV Screen 4th Generation wRfx: NONREACTIVE

## 2015-07-05 NOTE — Anesthesia Preprocedure Evaluation (Addendum)
Anesthesia Evaluation  Patient identified by MRN, date of birth, ID band  Reviewed: Allergy & Precautions, NPO status , Patient's Chart, lab work & pertinent test results  Airway Mallampati: II  TM Distance: >3 FB Neck ROM: Full    Dental no notable dental hx. (+) Dental Advisory Given, Missing, Chipped   Pulmonary asthma , sleep apnea (lost CPAP machine) and Continuous Positive Airway Pressure Ventilation , COPD,  COPD inhaler, Current Smoker,  breath sounds clear to auscultation  Pulmonary exam normal       Cardiovascular hypertension, Pt. on medications Normal cardiovascular examRhythm:Regular Rate:Normal  EKG 8/20 rate 100, sinus, OK   Neuro/Psych  Headaches, PSYCHIATRIC DISORDERS Anxiety Depression    GI/Hepatic Neg liver ROS, GERD-  Medicated,  Endo/Other  negative endocrine ROSOverweight BMI 34  Renal/GU      Musculoskeletal  (+) Fibromyalgia -  Abdominal   Peds  Hematology  (+) anemia , 11/33   Anesthesia Other Findings   Reproductive/Obstetrics                            Anesthesia Physical Anesthesia Plan  ASA: III  Anesthesia Plan: General   Post-op Pain Management:    Induction: Intravenous  Airway Management Planned: LMA  Additional Equipment:   Intra-op Plan:   Post-operative Plan:   Informed Consent: I have reviewed the patients History and Physical, chart, labs and discussed the procedure including the risks, benefits and alternatives for the proposed anesthesia with the patient or authorized representative who has indicated his/her understanding and acceptance.   Dental advisory given  Plan Discussed with:   Anesthesia Plan Comments:        Anesthesia Quick Evaluation

## 2015-07-05 NOTE — H&P (Signed)
H&P  44yo Autumn Gordon who presents for hysteroscopy, D&C, and hydrothermal ablation due to AUB and anemia. For the past several months, the patient has had significant bleeding that has lead to symptomatic anemia, which required a blood transfusion.  Following transfusion and Depot injection, she had no menses up until this month. Her menses started on 06/18/15 and she has had continuous bleeding since that time despite the injection as well as Medroxyprogesterone 20mg  daily. She is using both pads/tampon up to 10-12 per day with passage of large clots and signficant dysmenorrhea. Upon review of her EPIC chart, following the transfusion, pt has been under the care of the [facility] at the hospital. She was seen in June 2016 where they had discussed management with IUD, pt declined and desired Depot instead. Her next shot is due for September. Also a full work up had been completed, which including the following: TVUS: (03/14/15) 10cm uterus with 2 small fibroids (<2cm), no other abnormalities. Normal ovaries bilaterally EMB: (04/16/15): Secretory endometrium, no hyperplasia or malignancy Recent Hgb 10 (May 2016).   Current Medications  Taking   Nu-Iron(Polysaccharide Iron Complex) 150 MG Capsule 1 capsule twice a day   Robaxin-750(Methocarbamol) 750 MG Tablet 1 tablet up to TID prn back pain   Mobic(Meloxicam) 15 MG Tablet 1 tablet Once a day for back pain   Megestrol Acetate 20 MG Tablet 1 tablet Once a day   Medication List reviewed and reconciled with the patient    Past Medical History  Headache Domingo Cocking)  Anemia secondary to heavy menses   Surgical History  No Surgical History documented.   Family History  Father: CHF  Mother: deceased, ruptured anuresym  Paternal Grand Mother: alive, diagnosed with Colon Ca  Maternal Grand Mother: deceased, diagnosed with Colon Ca  Brother 1: deceased  [place] Aunt breast cancer.   Social History  General:  Tobacco use  cigarettes: Current  smoker Frequency: intermittent smoker 5 cigarettes per day Tobacco history last updated 07/03/2015 no Diet.  no Exercise.  Marital Status: married.  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  no Alcohol.  Caffeine: 2 servings daily, coffee.  Children: 9.    Gyn History  Sexual activity not currently sexually active.  Periods : Used to be every 30 days, recently very irregular.  LMP 06/18/2015-Currently.  Denies H/O Birth control.  [facility]  Denies H/O Last mammogram date.  Abnormal pap smear yes.    OB History  Number of pregnancies 10.  Pregnancy # 1 live birth, vaginal delivery.  Pregnancy # 2 live birth, vaginal delivery.  Pregnancy # 3 live birth, vaginal delivery, preeclampsia.  Pregnancy # 4: live birth, vaginal delivery, preeclampsia.  Pregnancy # 5: Live Birth, vaginal delivery, preclampsia.  Pregnancy # 6 live birth, vaginal, preeclampsia.  Pregnancy # 7 live birth, vaginal, preeclampsia.  Pregnancy # 8 live birth, vaginal preeclampsia.  Pregnancy # 9 live, vaginal birth, preeclampsia #10 live, vaginal birth, preeclampsia.    Allergies  Tramadol HCl: hives: Side Effects  Shellfish   Hospitalization/Major Diagnostic Procedure  Anemia: 2 units of PRBCs 2016   Review of Systems  CONSTITUTIONAL:  no Chills. no Fever. no Night sweats.  SKIN:  no Rash. no Hives.  HEENT:  Blurrred vision no. no Double vision.  CARDIOLOGY:  no Chest pain.  RESPIRATORY:  no Shortness of breath. no Cough.  GASTROENTEROLOGY:  no Appetite change. no Change in bowel movements.  UROLOGY:  no Urinary frequency. no Urinary incontinence. no Urinary urgency.  FEMALE REPRODUCTIVE:  no Breast lumps or discharge. no Breast pain. no Unusual vaginal discharge. no Vaginal irritation. no Vaginal itching.  NEUROLOGY:  no Dizziness. Headache yes. no Loss of consciousness.  PSYCHOLOGY:  no Depression. no Confusion.  HEMATOLOGY/LYMPH:  Anemia yes. Fatigue  yes. Using Blood Thinners no.     O: Examination performed in office (07/03/15) Vital Signs  Wt 197, Wt change 17 lb, Ht 65, BMI 32.78, Pulse sitting 78, BP sitting 141/98.   Examination  General Examination: GENERAL APPEARANCE well developed, well nourished.  SKIN: warm and dry, no rashes.  NECK: supple, normal appearance.  LUNGS: clear to auscultation bilaterally, no wheezes, rhonchi, rales.  HEART: no murmurs, regular rate and rhythm.  ABDOMEN: no masses palpated, soft and not tender, no rebound, no guarding.  FEMALE GENITOURINARY: Normal urethra, No external lesions, Vagina - pink moist mucosa, no lesions or abnormal discharge, cervix - closed, no discharge or lesions- no active bleeding, ~ 20cc of old blood noted in vault. No CMT. No adnexal masses bilaterally. Uterus: nontender and slightly enlarged.  MUSCULOSKELETAL no calf tenderness bilaterally.  EXTREMITIES: no edema present.  LYMPH NODES: no inguinal adenopathy.      8/19: Hgb 11.2  43yo Autumn Gordon who presents for hysteroscopy, D&C, Hydrothermal ablation due to AUB and symptomatic anemia -NPO -LR @ 125cc/hr -Ancef 2g IV to OR -SCDs to OR Discussed risk and benefit of IUD vs ablation. Pt wishes to proceed with endometrial ablation. Reviewed risk and benefit as well as expectations of day procedure.  Questions and concerns were addressed and pt wishes to proceed with procedure.  Janyth Pupa, DO [phone] (pager) [phone] (office)

## 2015-07-06 LAB — GC/CHLAMYDIA PROBE AMP (~~LOC~~) NOT AT ARMC
CHLAMYDIA, DNA PROBE: NEGATIVE
NEISSERIA GONORRHEA: NEGATIVE

## 2015-07-20 ENCOUNTER — Encounter (HOSPITAL_COMMUNITY): Payer: Self-pay

## 2015-07-20 ENCOUNTER — Telehealth: Payer: Self-pay | Admitting: Obstetrics and Gynecology

## 2015-07-20 ENCOUNTER — Inpatient Hospital Stay (HOSPITAL_COMMUNITY)
Admission: AD | Admit: 2015-07-20 | Discharge: 2015-07-20 | Disposition: A | Discharge status: Home / Self Care | Payer: Managed Care, Other (non HMO) | Source: Ambulatory Visit | Attending: Obstetrics & Gynecology | Admitting: Obstetrics & Gynecology

## 2015-07-20 DIAGNOSIS — I10 Essential (primary) hypertension: Secondary | ICD-10-CM | POA: Insufficient documentation

## 2015-07-20 DIAGNOSIS — F17210 Nicotine dependence, cigarettes, uncomplicated: Secondary | ICD-10-CM | POA: Insufficient documentation

## 2015-07-20 DIAGNOSIS — R109 Unspecified abdominal pain: Secondary | ICD-10-CM | POA: Diagnosis not present

## 2015-07-20 DIAGNOSIS — N946 Dysmenorrhea, unspecified: Secondary | ICD-10-CM | POA: Diagnosis not present

## 2015-07-20 DIAGNOSIS — N939 Abnormal uterine and vaginal bleeding, unspecified: Secondary | ICD-10-CM | POA: Diagnosis present

## 2015-07-20 LAB — CBC
HCT: 32.7 % — ABNORMAL LOW (ref 36.0–46.0)
HEMOGLOBIN: 10.6 g/dL — AB (ref 12.0–15.0)
MCH: 25.2 pg — AB (ref 26.0–34.0)
MCHC: 32.4 g/dL (ref 30.0–36.0)
MCV: 77.9 fL — AB (ref 78.0–100.0)
Platelets: 461 10*3/uL — ABNORMAL HIGH (ref 150–400)
RBC: 4.2 MIL/uL (ref 3.87–5.11)
RDW: 15.2 % (ref 11.5–15.5)
WBC: 6.8 10*3/uL (ref 4.0–10.5)

## 2015-07-20 MED ORDER — KETOROLAC TROMETHAMINE 60 MG/2ML IM SOLN
60.0000 mg | Freq: Once | INTRAMUSCULAR | Status: AC
Start: 2015-07-20 — End: 2015-07-20
  Administered 2015-07-20: 60 mg via INTRAMUSCULAR
  Filled 2015-07-20: qty 2

## 2015-07-20 MED ORDER — HYDROMORPHONE HCL 1 MG/ML IJ SOLN
1.0000 mg | Freq: Once | INTRAMUSCULAR | Status: AC
  Administered 2015-07-20: 1 mg via INTRAVENOUS
  Filled 2015-07-20: qty 1

## 2015-07-20 MED ORDER — HYDROCODONE-ACETAMINOPHEN 5-325 MG PO TABS
2.0000 | ORAL_TABLET | Freq: Once | ORAL | Status: AC
  Administered 2015-07-20: 2 via ORAL
  Filled 2015-07-20: qty 2

## 2015-07-20 MED ORDER — HYDROCODONE-ACETAMINOPHEN 5-325 MG PO TABS: 1.0000 | ORAL_TABLET | ORAL | Status: DC | PRN

## 2015-07-20 MED ORDER — ONDANSETRON 4 MG PO TBDP
4.0000 mg | ORAL_TABLET | Freq: Once | ORAL | Status: AC
  Administered 2015-07-20: 4 mg via ORAL
  Filled 2015-07-20: qty 1

## 2015-07-20 MED ORDER — PROMETHAZINE HCL 12.5 MG PO TABS: 12.5000 mg | ORAL_TABLET | Freq: Four times a day (QID) | ORAL | Status: DC | PRN

## 2015-07-20 MED ORDER — HYDROCODONE-ACETAMINOPHEN 5-325 MG PO TABS
2.0000 | ORAL_TABLET | Freq: Once | ORAL | Status: DC
  Filled 2015-07-20: qty 2

## 2015-07-20 MED ORDER — KETOROLAC TROMETHAMINE 10 MG PO TABS: 10.0000 mg | ORAL_TABLET | Freq: Four times a day (QID) | ORAL | Status: DC | PRN

## 2015-07-20 MED ORDER — MEDROXYPROGESTERONE ACETATE 10 MG PO TABS: 20.0000 mg | ORAL_TABLET | Freq: Every day | ORAL | Status: DC

## 2015-07-20 NOTE — MAU Note (Signed)
Having severe cramping.  Has been bleeding for 5+ wks, has gotten heavier.  On meds for that and pain meds.  Is scheduled for an ablation on 9/19.

## 2015-07-20 NOTE — Telephone Encounter (Signed)
TC from patient of Dr. Maurice March DUB x several weeks, possible PCOS, but in process of evaluation.  On oral progesterone since Friday, in an attempt to stop the DUB.  Patient also having severe cramping since yesterday, using Naprosyn without benefit.  Feeling tired.  Has D&C scheduled for 08/03/15.  Advised patient she could be seen in MAU for evaluation for better pain management and ensuring her stability.  Still recommend patient keep plan for f/u with Dr. Nelda Marseille tomorrow for more long-term plan of care for DUB, but can come to MAU today for interim evaluation.  Donnel Saxon, CNM 07/20/15 8a
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
## 2015-07-28 ENCOUNTER — Inpatient Hospital Stay (HOSPITAL_COMMUNITY): Admission: RE | Admit: 2015-07-28 | Payer: Managed Care, Other (non HMO) | Source: Ambulatory Visit

## 2015-07-29 ENCOUNTER — Encounter (HOSPITAL_COMMUNITY): Payer: Self-pay

## 2015-07-29 ENCOUNTER — Encounter (HOSPITAL_COMMUNITY)
Admission: RE | Admit: 2015-07-29 | Discharge: 2015-07-29 | Disposition: A | Discharge status: Home / Self Care | Payer: Managed Care, Other (non HMO) | Source: Ambulatory Visit | Attending: Obstetrics & Gynecology | Admitting: Obstetrics & Gynecology

## 2015-07-29 DIAGNOSIS — N939 Abnormal uterine and vaginal bleeding, unspecified: Secondary | ICD-10-CM | POA: Insufficient documentation

## 2015-07-29 DIAGNOSIS — Z01818 Encounter for other preprocedural examination: Secondary | ICD-10-CM | POA: Diagnosis not present

## 2015-07-29 LAB — CBC
HEMATOCRIT: 30.4 % — AB (ref 36.0–46.0)
Hemoglobin: 9.8 g/dL — ABNORMAL LOW (ref 12.0–15.0)
MCH: 24.9 pg — AB (ref 26.0–34.0)
MCHC: 32.2 g/dL (ref 30.0–36.0)
MCV: 77.2 fL — ABNORMAL LOW (ref 78.0–100.0)
Platelets: 404 10*3/uL — ABNORMAL HIGH (ref 150–400)
RBC: 3.94 MIL/uL (ref 3.87–5.11)
RDW: 15 % (ref 11.5–15.5)
WBC: 7 10*3/uL (ref 4.0–10.5)

## 2015-07-29 LAB — BASIC METABOLIC PANEL
Anion gap: 10 (ref 5–15)
BUN: 19 mg/dL (ref 6–20)
CALCIUM: 9.2 mg/dL (ref 8.9–10.3)
CO2: 22 mmol/L (ref 22–32)
Chloride: 105 mmol/L (ref 101–111)
Creatinine, Ser: 0.62 mg/dL (ref 0.44–1.00)
GFR calc Af Amer: >60 mL/min (ref >60)
GLUCOSE: 98 mg/dL (ref 65–99)
Potassium: 4.3 mmol/L (ref 3.5–5.1)
Sodium: 137 mmol/L (ref 135–145)

## 2015-07-29 NOTE — Patient Instructions (Signed)
   Your procedure is scheduled on: sept 19 at [place] through the [facility] at: [place] up the phone at the desk and dial [phone] and inform us of your arrival.  Please call this number if you have any problems the morning of surgery: [phone]  Remember: Do not eat food after midnight: sept 18 Do not drink clear liquids after: 9am day of surgery sept 19 Take these medicines the morning of surgery with a SIP OF WATER:  Do not wear jewelry, make-up, or FINGER nail polish No metal in your hair or on your body. Do not wear lotions, powders, perfumes.  You may wear deodorant.  Do not bring valuables to the hospital. Contacts, dentures or bridgework may not be worn into surgery.    Patients discharged on the day of surgery will not be allowed to drive home.

## 2015-07-30 ENCOUNTER — Ambulatory Visit: Payer: Managed Care, Other (non HMO)

## 2015-08-03 ENCOUNTER — Ambulatory Visit (HOSPITAL_COMMUNITY)
Admission: RE | Admit: 2015-08-03 | Discharge: 2015-08-03 | Disposition: A | Discharge status: Home / Self Care | Payer: Managed Care, Other (non HMO) | Source: Ambulatory Visit | Attending: Obstetrics & Gynecology | Admitting: Obstetrics & Gynecology

## 2015-08-03 ENCOUNTER — Encounter (HOSPITAL_COMMUNITY)
Admission: RE | Disposition: A | Discharge status: Home / Self Care | Payer: Self-pay | Source: Ambulatory Visit | Attending: Obstetrics & Gynecology

## 2015-08-03 ENCOUNTER — Ambulatory Visit (HOSPITAL_COMMUNITY): Payer: Managed Care, Other (non HMO) | Admitting: Anesthesiology

## 2015-08-03 DIAGNOSIS — K219 Gastro-esophageal reflux disease without esophagitis: Secondary | ICD-10-CM | POA: Insufficient documentation

## 2015-08-03 DIAGNOSIS — D649 Anemia, unspecified: Secondary | ICD-10-CM | POA: Insufficient documentation

## 2015-08-03 DIAGNOSIS — I10 Essential (primary) hypertension: Secondary | ICD-10-CM | POA: Insufficient documentation

## 2015-08-03 DIAGNOSIS — N939 Abnormal uterine and vaginal bleeding, unspecified: Secondary | ICD-10-CM | POA: Diagnosis not present

## 2015-08-03 DIAGNOSIS — F17210 Nicotine dependence, cigarettes, uncomplicated: Secondary | ICD-10-CM | POA: Insufficient documentation

## 2015-08-03 HISTORY — PX: DILITATION & CURRETTAGE/HYSTROSCOPY WITH HYDROTHERMAL ABLATION: SHX5570

## 2015-08-03 LAB — PREGNANCY, URINE: PREG TEST UR: NEGATIVE

## 2015-08-03 SURGERY — DILATATION & CURETTAGE/HYSTEROSCOPY WITH HYDROTHERMAL ABLATION
Anesthesia: General

## 2015-08-03 MED ORDER — ONDANSETRON HCL 4 MG/2ML IJ SOLN
INTRAMUSCULAR | Status: DC | PRN
  Administered 2015-08-03: 4 mg via INTRAVENOUS

## 2015-08-03 MED ORDER — ONDANSETRON HCL 4 MG/2ML IJ SOLN: 4.0000 mg | Freq: Once | INTRAMUSCULAR | Status: DC | PRN

## 2015-08-03 MED ORDER — PROPOFOL 10 MG/ML IV BOLUS
INTRAVENOUS | Status: DC | PRN
  Administered 2015-08-03: 200 mg via INTRAVENOUS

## 2015-08-03 MED ORDER — MIDAZOLAM HCL 2 MG/2ML IJ SOLN
INTRAMUSCULAR | Status: DC | PRN
  Administered 2015-08-03: 0.5 mg via INTRAVENOUS
  Administered 2015-08-03: 1.5 mg via INTRAVENOUS

## 2015-08-03 MED ORDER — CEFAZOLIN SODIUM-DEXTROSE 2-3 GM-% IV SOLR
2.0000 g | INTRAVENOUS | Status: AC
  Administered 2015-08-03: 2 g via INTRAVENOUS

## 2015-08-03 MED ORDER — DEXAMETHASONE SODIUM PHOSPHATE 10 MG/ML IJ SOLN
INTRAMUSCULAR | Status: DC | PRN
  Administered 2015-08-03: 10 mg via INTRAVENOUS

## 2015-08-03 MED ORDER — LACTATED RINGERS IV SOLN: INTRAVENOUS | Status: DC

## 2015-08-03 MED ORDER — MEPERIDINE HCL 25 MG/ML IJ SOLN
INTRAMUSCULAR | Status: AC
  Administered 2015-08-03: 6.25 mg via INTRAVENOUS
  Filled 2015-08-03: qty 1

## 2015-08-03 MED ORDER — FENTANYL CITRATE (PF) 100 MCG/2ML IJ SOLN
25.0000 ug | INTRAMUSCULAR | Status: DC | PRN
  Administered 2015-08-03 (×2): 50 ug via INTRAVENOUS

## 2015-08-03 MED ORDER — DEXAMETHASONE SODIUM PHOSPHATE 10 MG/ML IJ SOLN
INTRAMUSCULAR | Status: AC
  Filled 2015-08-03: qty 1

## 2015-08-03 MED ORDER — CEFAZOLIN SODIUM-DEXTROSE 2-3 GM-% IV SOLR
INTRAVENOUS | Status: AC
  Filled 2015-08-03: qty 50

## 2015-08-03 MED ORDER — SODIUM CHLORIDE 0.9 % IR SOLN
Status: DC | PRN
  Administered 2015-08-03: 3000 mL

## 2015-08-03 MED ORDER — FENTANYL CITRATE (PF) 100 MCG/2ML IJ SOLN
INTRAMUSCULAR | Status: AC
  Administered 2015-08-03: 50 ug via INTRAVENOUS
  Filled 2015-08-03: qty 2

## 2015-08-03 MED ORDER — SCOPOLAMINE 1 MG/3DAYS TD PT72
1.0000 | MEDICATED_PATCH | Freq: Once | TRANSDERMAL | Status: DC
  Administered 2015-08-03: 1.5 mg via TRANSDERMAL

## 2015-08-03 MED ORDER — FENTANYL CITRATE (PF) 100 MCG/2ML IJ SOLN
INTRAMUSCULAR | Status: DC | PRN
  Administered 2015-08-03 (×3): 25 ug via INTRAVENOUS

## 2015-08-03 MED ORDER — MIDAZOLAM HCL 2 MG/2ML IJ SOLN
INTRAMUSCULAR | Status: AC
  Filled 2015-08-03: qty 4

## 2015-08-03 MED ORDER — ALBUTEROL SULFATE HFA 108 (90 BASE) MCG/ACT IN AERS
INHALATION_SPRAY | RESPIRATORY_TRACT | Status: AC
  Filled 2015-08-03: qty 6.7

## 2015-08-03 MED ORDER — LACTATED RINGERS IV SOLN
INTRAVENOUS | Status: DC
  Administered 2015-08-03 (×2): via INTRAVENOUS

## 2015-08-03 MED ORDER — MEPERIDINE HCL 25 MG/ML IJ SOLN
6.2500 mg | INTRAMUSCULAR | Status: DC | PRN
  Administered 2015-08-03: 6.25 mg via INTRAVENOUS

## 2015-08-03 MED ORDER — KETOROLAC TROMETHAMINE 30 MG/ML IJ SOLN
INTRAMUSCULAR | Status: AC
  Filled 2015-08-03: qty 1

## 2015-08-03 MED ORDER — PROMETHAZINE HCL 25 MG/ML IJ SOLN: 6.2500 mg | INTRAMUSCULAR | Status: DC | PRN

## 2015-08-03 MED ORDER — KETOROLAC TROMETHAMINE 30 MG/ML IJ SOLN
INTRAMUSCULAR | Status: DC | PRN
  Administered 2015-08-03: 30 mg via INTRAVENOUS

## 2015-08-03 MED ORDER — LIDOCAINE HCL (CARDIAC) 20 MG/ML IV SOLN
INTRAVENOUS | Status: AC
  Filled 2015-08-03: qty 5

## 2015-08-03 MED ORDER — LIDOCAINE HCL (CARDIAC) 20 MG/ML IV SOLN
INTRAVENOUS | Status: DC | PRN
  Administered 2015-08-03: 100 mg via INTRAVENOUS

## 2015-08-03 MED ORDER — ONDANSETRON HCL 4 MG/2ML IJ SOLN
INTRAMUSCULAR | Status: AC
  Filled 2015-08-03: qty 2

## 2015-08-03 MED ORDER — PROPOFOL 10 MG/ML IV BOLUS
INTRAVENOUS | Status: AC
  Filled 2015-08-03: qty 20

## 2015-08-03 MED ORDER — OXYCODONE HCL 5 MG PO TABS
ORAL_TABLET | ORAL | Status: AC
  Filled 2015-08-03: qty 1

## 2015-08-03 MED ORDER — GLYCOPYRROLATE 0.2 MG/ML IJ SOLN
INTRAMUSCULAR | Status: AC
  Filled 2015-08-03: qty 1

## 2015-08-03 MED ORDER — FENTANYL CITRATE (PF) 100 MCG/2ML IJ SOLN
INTRAMUSCULAR | Status: AC
  Filled 2015-08-03: qty 4

## 2015-08-03 MED ORDER — OXYCODONE HCL 5 MG PO TABS
5.0000 mg | ORAL_TABLET | Freq: Once | ORAL | Status: AC
  Administered 2015-08-03: 5 mg via ORAL

## 2015-08-03 MED ORDER — FENTANYL CITRATE (PF) 100 MCG/2ML IJ SOLN: 25.0000 ug | INTRAMUSCULAR | Status: DC | PRN

## 2015-08-03 MED ORDER — SCOPOLAMINE 1 MG/3DAYS TD PT72
MEDICATED_PATCH | TRANSDERMAL | Status: AC
  Filled 2015-08-03: qty 1

## 2015-08-03 SURGICAL SUPPLY — 13 items
CANISTER SUCT 3000ML (MISCELLANEOUS) ×3 IMPLANT
CATH ROBINSON RED A/P 16FR (CATHETERS) ×3 IMPLANT
CLOTH BEACON ORANGE TIMEOUT ST (SAFETY) ×3 IMPLANT
CONTAINER PREFILL 10% NBF 60ML (FORM) ×6 IMPLANT
DILATOR CANAL MILEX (MISCELLANEOUS) IMPLANT
GLOVE BIOGEL PI IND STRL 6.5 (GLOVE) ×2 IMPLANT
GLOVE BIOGEL PI INDICATOR 6.5 (GLOVE) ×4
GLOVE ECLIPSE 6.5 STRL STRAW (GLOVE) ×3 IMPLANT
GOWN STRL REUS W/TWL LRG LVL3 (GOWN DISPOSABLE) ×6 IMPLANT
PACK VAGINAL MINOR WOMEN LF (CUSTOM PROCEDURE TRAY) ×3 IMPLANT
PAD OB MATERNITY 4.3X12.25 (PERSONAL CARE ITEMS) ×3 IMPLANT
SET GENESYS HTA PROCERVA (MISCELLANEOUS) ×3 IMPLANT
TOWEL OR 17X24 6PK STRL BLUE (TOWEL DISPOSABLE) ×6 IMPLANT

## 2015-08-03 NOTE — OR Nursing (Signed)
Dc to home skin warm dry pink resp even and unlabored . Still having some cramps states this is normal for her. Darin Engels rn

## 2015-08-03 NOTE — Op Note (Signed)
Operative Report  PreOp: Abnormal uterine bleeding PostOp: same Procedure:  Hysteroscopy, Dilation and Curettage, Endometrial ablation-HTA Surgeon: Dr. Janyth Pupa Anesthesia: General Complications:none EBL: [id number] UOP: 200cc IVF:1300c  Findings: 9wk sized anteverted uterus with proliferative endometrium.  Both ostia visualized, no other discrete cavity lesions appreciated. Specimens: 1) ECC 2) EMB   Procedure: The patient was taken to the operating room where she underwent general anesthesia without difficulty. The patient was placed in a low lithotomy position using Allen stirrups. The patient was examined with the findings as noted above.  She was then prepped and draped in the normal sterile fashion. The bladder was drained using a red rubber urethral catheter. A sterile speculum was inserted into the vagina. A single tooth tenaculum was placed on the anterior lip of the cervix. The uterus was then sounded to 9. The hysteroscope was then inserted without difficulty and noted to have the findings as listed above.Visualization was achieved using NS as the distending medium. HTA ablation was performed according to manufacturer protocol.  During ablation, good flow and adequate visualization was maintained.  Global "snow white" appearance was seen throughout the cavity.  The hysteroscope was removed and sharp curettage was performed. The tissue was sent to pathology.  All instrument were then removed. Hemostasis was observed at the cervical site. The patient was repositioned to the supine position. The patient tolerated the procedure without any complications and taken to recovery in stable condition.   Janyth Pupa, DO [phone] (pager) [phone] (office)

## 2015-08-03 NOTE — Interval H&P Note (Signed)
History and Physical Interval Note:  08/03/2015 12:46 PM  Autumn Gordon  has presented today for surgery, with the diagnosis of N93.9 AUB  The various methods of treatment have been discussed with the patient and family. After consideration of risks, benefits and other options for treatment, the patient has consented to  Procedure(s): DILATATION & CURETTAGE/HYSTEROSCOPY WITH HYDROTHERMAL ABLATION (N/A) as a surgical intervention .  The patient's history has been reviewed, patient examined, no change in status, stable for surgery.  I have reviewed the patient's chart and labs.  Questions were answered to the patient's satisfaction.     Janyth Pupa, M

## 2015-08-03 NOTE — Transfer of Care (Signed)
Immediate Anesthesia Transfer of Care Note  Patient: Autumn Gordon  Procedure(s) Performed: Procedure(s): DILATATION & CURETTAGE/HYSTEROSCOPY WITH HYDROTHERMAL ABLATION (N/A)  Patient Location: PACU  Anesthesia Type:General  Level of Consciousness: awake, alert  and oriented  Airway & Oxygen Therapy: Patient Spontanous Breathing and Patient connected to nasal cannula oxygen  Post-op Assessment: Report given to RN, Post -op Vital signs reviewed and stable and Patient moving all extremities X 4  Post vital signs: Reviewed and stable  Last Vitals:  Filed Vitals:   08/03/15 1153  BP: 132/82  Pulse: 84  Temp: 37 C  Resp: 18    Complications: No apparent anesthesia complications

## 2015-08-03 NOTE — Anesthesia Procedure Notes (Signed)
Procedure Name: LMA Insertion Date/Time: 08/03/2015 1:06 PM Performed by: ADELOYE, DAVID A Pre-anesthesia Checklist: Patient identified, Patient being monitored, Timeout performed, Emergency Drugs available and Suction available Patient Re-evaluated:Patient Re-evaluated prior to inductionOxygen Delivery Method: Circle system utilized Preoxygenation: Pre-oxygenation with 100% oxygen Intubation Type: IV induction LMA: LMA inserted LMA Size: 4.0 Number of attempts: 1 Placement Confirmation: ETT inserted through vocal cords under direct vision,  breath sounds checked- equal and bilateral and positive ETCO2 Tube secured with: Tape Dental Injury: Teeth and Oropharynx as per pre-operative assessment

## 2015-08-03 NOTE — Discharge Instructions (Addendum)
HOME INSTRUCTIONS  Please note any unusual or excessive bleeding, pain, swelling. Mild dizziness or drowsiness are normal for about 24 hours after surgery.   Shower when comfortable  Restrictions: No driving for 24 hours or while taking pain medications.  Activity:  No heavy lifting (> 10 lbs), nothing in vagina (no tampons, douching, or intercourse) x 4 weeks; no tub baths for 4 weeks Vaginal spotting is expected but if your bleeding is heavy, period like,  please call the office   Incision: the bandaids will fall off when they are ready to; you may clean your incision with mild soap and water but do not rub or scrub the incision site.  You may experience slight bloody drainage from your incision periodically.  This is normal.  If you experience a large amount of drainage or the incision opens, please call your physician who will likely direct you to the emergency department.  Diet:  You may return to a regular diet.  Do not eat large meals.  Eat small frequent meals throughout the day.  Continue to drink a good amount of water at least 6-8 glasses of water per day, hydration is very important for the healing process.  Pain Management: Take Motrin and/or Tylenol as needed for pain.  Always take prescription pain medication with food, it may cause constipation, increase fluids and fiber and you may want to take an over-the-counter stool softener like Colace as needed up to 2x a day.    Alcohol -- Avoid for 24 hours and while taking pain medications.  Nausea: Take sips of ginger ale or soda  Fever -- Call physician if temperature over 101 degrees  Follow up:  If you do not already have a follow up appointment scheduled, please call the office at [phone].  If you experience fever (a temperature greater than 100.4), pain unrelieved by pain medication, shortness of breath, swelling of a single leg, or any other symptoms which are concerning to you please the office immediately.DISCHARGE  INSTRUCTIONS: D&C / D&E The following instructions have been prepared to help you care for yourself upon your return home.   Personal hygiene:  Use sanitary pads for vaginal drainage, not tampons.  Shower the day after your procedure.  NO tub baths, pools or Jacuzzis for 2-3 weeks.  Wipe front to back after using the bathroom.  Activity and limitations:  Do NOT drive or operate any equipment for 24 hours. The effects of anesthesia are still present and drowsiness may result.  Do NOT rest in bed all day.  Walking is encouraged.  Walk up and down stairs slowly.  You may resume your normal activity in one to two days or as indicated by your physician.  Sexual activity: NO intercourse for at least 2 weeks after the procedure, or as indicated by your physician.  Diet: Eat a light meal as desired this evening. You may resume your usual diet tomorrow.  Return to work: You may resume your work activities in one to two days or as indicated by your doctor.  What to expect after your surgery: Expect to have vaginal bleeding/discharge for 2-3 days and spotting for up to 10 days. It is not unusual to have soreness for up to 1-2 weeks. You may have a slight burning sensation when you urinate for the first day. Mild cramps may continue for a couple of days. You may have a regular period in 2-6 weeks.  Call your doctor for any of the following:  Excessive vaginal  bleeding, saturating and changing one pad every hour.  Inability to urinate 6 hours after discharge from hospital.  Pain not relieved by pain medication.  Fever of 100.4 F or greater.  Unusual vaginal discharge or odor.   Call for an appointment:    Patients signature: ______________________  Nurses signature ________________________  Support person's signature_______________________

## 2015-08-03 NOTE — Anesthesia Postprocedure Evaluation (Signed)
  Anesthesia Post-op Note  Patient: Autumn Gordon  Procedure(s) Performed: Procedure(s) (LRB): DILATATION & CURETTAGE/HYSTEROSCOPY WITH HYDROTHERMAL ABLATION (N/A)  Patient Location: PACU  Anesthesia Type: General  Level of Consciousness: awake and alert   Airway and Oxygen Therapy: Patient Spontanous Breathing  Post-op Pain: mild  Post-op Assessment: Post-op Vital signs reviewed, Patient's Cardiovascular Status Stable, Respiratory Function Stable, Patent Airway and No signs of Nausea or vomiting  Last Vitals:  Filed Vitals:   08/03/15 1515  BP: 128/63  Pulse: 77  Temp:   Resp: 13    Post-op Vital Signs: stable   Complications: No apparent anesthesia complications

## 2015-08-04 ENCOUNTER — Encounter (HOSPITAL_COMMUNITY): Payer: Self-pay | Admitting: Obstetrics & Gynecology

## 2015-08-15 ENCOUNTER — Emergency Department (HOSPITAL_COMMUNITY)
Admission: EM | Admit: 2015-08-15 | Discharge: 2015-08-15 | Disposition: A | Discharge status: Home / Self Care | Payer: Managed Care, Other (non HMO) | Attending: Emergency Medicine | Admitting: Emergency Medicine

## 2015-08-15 ENCOUNTER — Encounter (HOSPITAL_COMMUNITY): Payer: Self-pay | Admitting: Emergency Medicine

## 2015-08-15 DIAGNOSIS — I10 Essential (primary) hypertension: Secondary | ICD-10-CM | POA: Diagnosis not present

## 2015-08-15 DIAGNOSIS — Z720 Tobacco use: Secondary | ICD-10-CM | POA: Diagnosis not present

## 2015-08-15 DIAGNOSIS — Z9981 Dependence on supplemental oxygen: Secondary | ICD-10-CM | POA: Diagnosis not present

## 2015-08-15 DIAGNOSIS — R1030 Lower abdominal pain, unspecified: Secondary | ICD-10-CM | POA: Diagnosis present

## 2015-08-15 DIAGNOSIS — R102 Pelvic and perineal pain: Secondary | ICD-10-CM | POA: Diagnosis not present

## 2015-08-15 DIAGNOSIS — Z8659 Personal history of other mental and behavioral disorders: Secondary | ICD-10-CM | POA: Insufficient documentation

## 2015-08-15 DIAGNOSIS — Z862 Personal history of diseases of the blood and blood-forming organs and certain disorders involving the immune mechanism: Secondary | ICD-10-CM | POA: Diagnosis not present

## 2015-08-15 DIAGNOSIS — Z8739 Personal history of other diseases of the musculoskeletal system and connective tissue: Secondary | ICD-10-CM | POA: Insufficient documentation

## 2015-08-15 DIAGNOSIS — G4733 Obstructive sleep apnea (adult) (pediatric): Secondary | ICD-10-CM | POA: Diagnosis not present

## 2015-08-15 DIAGNOSIS — Z8542 Personal history of malignant neoplasm of other parts of uterus: Secondary | ICD-10-CM | POA: Insufficient documentation

## 2015-08-15 DIAGNOSIS — J449 Chronic obstructive pulmonary disease, unspecified: Secondary | ICD-10-CM | POA: Insufficient documentation

## 2015-08-15 DIAGNOSIS — Z8719 Personal history of other diseases of the digestive system: Secondary | ICD-10-CM | POA: Insufficient documentation

## 2015-08-15 DIAGNOSIS — N946 Dysmenorrhea, unspecified: Secondary | ICD-10-CM | POA: Insufficient documentation

## 2015-08-15 DIAGNOSIS — G8929 Other chronic pain: Secondary | ICD-10-CM | POA: Insufficient documentation

## 2015-08-15 LAB — CBC
HEMATOCRIT: 32.9 % — AB (ref 36.0–46.0)
Hemoglobin: 10.5 g/dL — ABNORMAL LOW (ref 12.0–15.0)
MCH: 24.6 pg — ABNORMAL LOW (ref 26.0–34.0)
MCHC: 31.9 g/dL (ref 30.0–36.0)
MCV: 77 fL — AB (ref 78.0–100.0)
Platelets: 492 10*3/uL — ABNORMAL HIGH (ref 150–400)
RBC: 4.27 MIL/uL (ref 3.87–5.11)
RDW: 14.7 % (ref 11.5–15.5)
WBC: 7.4 10*3/uL (ref 4.0–10.5)

## 2015-08-15 LAB — URINALYSIS, ROUTINE W REFLEX MICROSCOPIC
Bilirubin Urine: NEGATIVE
Glucose, UA: NEGATIVE mg/dL
KETONES UR: NEGATIVE mg/dL
LEUKOCYTES UA: NEGATIVE
NITRITE: NEGATIVE
PH: 6 (ref 5.0–8.0)
Protein, ur: NEGATIVE mg/dL
SPECIFIC GRAVITY, URINE: 1.02 (ref 1.005–1.030)
Urobilinogen, UA: 0.2 mg/dL (ref 0.0–1.0)

## 2015-08-15 LAB — COMPREHENSIVE METABOLIC PANEL
ALT: 19 U/L (ref 14–54)
ANION GAP: 9 (ref 5–15)
AST: 26 U/L (ref 15–41)
Albumin: 3.9 g/dL (ref 3.5–5.0)
Alkaline Phosphatase: 58 U/L (ref 38–126)
BUN: 8 mg/dL (ref 6–20)
CHLORIDE: 111 mmol/L (ref 101–111)
CO2: 21 mmol/L — ABNORMAL LOW (ref 22–32)
Calcium: 8.8 mg/dL — ABNORMAL LOW (ref 8.9–10.3)
Creatinine, Ser: 0.63 mg/dL (ref 0.44–1.00)
Glucose, Bld: 95 mg/dL (ref 65–99)
POTASSIUM: 3.8 mmol/L (ref 3.5–5.1)
Sodium: 141 mmol/L (ref 135–145)
Total Bilirubin: 0.5 mg/dL (ref 0.3–1.2)
Total Protein: 6.7 g/dL (ref 6.5–8.1)

## 2015-08-15 LAB — WET PREP, GENITAL
CLUE CELLS WET PREP: NONE SEEN
Trich, Wet Prep: NONE SEEN
YEAST WET PREP: NONE SEEN

## 2015-08-15 LAB — URINE MICROSCOPIC-ADD ON

## 2015-08-15 LAB — LIPASE, BLOOD: LIPASE: 28 U/L (ref 22–51)

## 2015-08-15 LAB — I-STAT BETA HCG BLOOD, ED (MC, WL, AP ONLY)

## 2015-08-15 MED ORDER — HYDROCODONE-ACETAMINOPHEN 5-325 MG PO TABS
1.0000 | ORAL_TABLET | Freq: Once | ORAL | Status: AC
  Administered 2015-08-15: 1 via ORAL
  Filled 2015-08-15: qty 1

## 2015-08-15 MED ORDER — HYDROCODONE-ACETAMINOPHEN 5-325 MG PO TABS: 1.0000 | ORAL_TABLET | Freq: Four times a day (QID) | ORAL | Status: DC | PRN

## 2015-08-15 MED ORDER — ONDANSETRON 4 MG PO TBDP: 4.0000 mg | ORAL_TABLET | Freq: Three times a day (TID) | ORAL | Status: DC | PRN

## 2015-08-15 MED ORDER — ONDANSETRON 4 MG PO TBDP
4.0000 mg | ORAL_TABLET | Freq: Once | ORAL | Status: AC
  Administered 2015-08-15: 4 mg via ORAL
  Filled 2015-08-15: qty 1

## 2015-08-15 MED ORDER — KETOROLAC TROMETHAMINE 60 MG/2ML IM SOLN
60.0000 mg | Freq: Once | INTRAMUSCULAR | Status: AC
  Administered 2015-08-15: 60 mg via INTRAMUSCULAR
  Filled 2015-08-15: qty 2

## 2015-08-15 NOTE — ED Notes (Signed)
Pt ambulated to restroom with steady gait and with standby assist; pt instructed to provide clean catch urine sample; pt returned to room stating she was unable to go at this time; will try again soon; MD notified

## 2015-08-15 NOTE — ED Provider Notes (Signed)
Pelvic exam:  VULVA: normal appearing vulva with no masses, tenderness or lesions,  VAGINA: normal appearing vagina with normal color, no lesions, vaginal discharge - bloody and thin, DNA probe for chlamydia and GC obtained, CERVIX: cervical discharge present - bloody, brown and thin, cervical motion tenderness absent, multiparous os  UTERUS: uterus is normal size, shape, consistency and nontender,  ADNEXA: normal adnexa in size, nontender and no masses,  Exam chaperoned by Wilhemena Durie, RN.    Jarrett Soho Haunani Dickard, PA-C 08/15/15 1610  Julianne Rice, MD 08/15/15 [phone]

## 2015-08-15 NOTE — Discharge Instructions (Signed)

## 2015-08-15 NOTE — ED Notes (Signed)
Pt reports [facility]; pt reports vaginal bleeding yesterday with increasing pelvic and lower abd pain with nausea and vomiting

## 2015-08-15 NOTE — ED Notes (Signed)
Pt called out asking for something else for nausea.
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
## 2015-08-15 NOTE — ED Notes (Signed)
Pt called out to ask if she could have something else for pain. Pt is tearful. Pt states that her pain feels like a nerve is being squeezed and the pain shoots down her leg.

## 2015-08-17 ENCOUNTER — Telehealth: Payer: Self-pay

## 2015-08-17 LAB — GC/CHLAMYDIA PROBE AMP (~~LOC~~) NOT AT ARMC
Chlamydia: NEGATIVE
NEISSERIA GONORRHEA: NEGATIVE

## 2015-08-17 NOTE — Telephone Encounter (Signed)
TC from answering service states patient call c/o pain.  Attempted to return call with no answer.  Voicemail not set up. JE, CNM

## 2015-09-04 ENCOUNTER — Encounter (HOSPITAL_COMMUNITY): Payer: Self-pay

## 2015-09-04 ENCOUNTER — Other Ambulatory Visit: Payer: Self-pay | Admitting: Family Medicine

## 2015-09-04 ENCOUNTER — Ambulatory Visit (HOSPITAL_COMMUNITY)
Admission: RE | Admit: 2015-09-04 | Discharge: 2015-09-04 | Disposition: A | Discharge status: Home / Self Care | Payer: Managed Care, Other (non HMO) | Source: Ambulatory Visit | Attending: Family Medicine | Admitting: Family Medicine

## 2015-09-04 DIAGNOSIS — N281 Cyst of kidney, acquired: Secondary | ICD-10-CM | POA: Diagnosis not present

## 2015-09-04 DIAGNOSIS — N83201 Unspecified ovarian cyst, right side: Secondary | ICD-10-CM | POA: Insufficient documentation

## 2015-09-04 DIAGNOSIS — M87852 Other osteonecrosis, left femur: Secondary | ICD-10-CM | POA: Diagnosis not present

## 2015-09-04 DIAGNOSIS — R1030 Lower abdominal pain, unspecified: Secondary | ICD-10-CM

## 2015-09-04 DIAGNOSIS — R1031 Right lower quadrant pain: Secondary | ICD-10-CM | POA: Diagnosis not present

## 2015-09-04 DIAGNOSIS — K5900 Constipation, unspecified: Secondary | ICD-10-CM | POA: Insufficient documentation

## 2015-09-04 MED ORDER — IOHEXOL 300 MG/ML  SOLN: 100.0000 mL | Freq: Once | INTRAMUSCULAR | Status: DC | PRN

## 2015-10-27 ENCOUNTER — Ambulatory Visit
Admission: RE | Admit: 2015-10-27 | Discharge: 2015-10-27 | Disposition: A | Discharge status: Home / Self Care | Payer: Managed Care, Other (non HMO) | Source: Ambulatory Visit | Attending: Physician Assistant | Admitting: Physician Assistant

## 2015-10-27 ENCOUNTER — Other Ambulatory Visit: Payer: Self-pay | Admitting: Physician Assistant

## 2015-10-27 DIAGNOSIS — R059 Cough, unspecified: Secondary | ICD-10-CM

## 2015-10-27 DIAGNOSIS — R0602 Shortness of breath: Secondary | ICD-10-CM

## 2015-10-27 DIAGNOSIS — R05 Cough: Secondary | ICD-10-CM
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
## 2015-11-26 ENCOUNTER — Ambulatory Visit (INDEPENDENT_AMBULATORY_CARE_PROVIDER_SITE_OTHER): Payer: Managed Care, Other (non HMO) | Admitting: Cardiovascular Disease

## 2015-11-26 ENCOUNTER — Encounter: Payer: Self-pay | Admitting: Cardiovascular Disease

## 2015-11-26 VITALS — BP 130/70 | HR 87 | Ht 65.0 in | Wt 203.8 lb

## 2015-11-26 DIAGNOSIS — Z7689 Persons encountering health services in other specified circumstances: Secondary | ICD-10-CM

## 2015-11-26 DIAGNOSIS — R002 Palpitations: Secondary | ICD-10-CM | POA: Diagnosis not present

## 2015-11-26 DIAGNOSIS — Z7189 Other specified counseling: Secondary | ICD-10-CM | POA: Diagnosis not present

## 2015-11-26 DIAGNOSIS — R55 Syncope and collapse: Secondary | ICD-10-CM | POA: Diagnosis not present

## 2015-11-26 NOTE — Patient Instructions (Addendum)
Medication Instructions:  Your physician recommends that you continue on your current medications as directed. Please refer to the Current Medication list given to you today.  Labwork: NONE  Testing/Procedures: Your physician has requested that you have an exercise tolerance test. For further information please visit [email or web address]. Please also follow instruction sheet, as given.  Your physician has requested that you have an echocardiogram. Echocardiography is a painless test that uses sound waves to create images of your heart. It provides your doctor with information about the size and shape of your heart and how well your heart's chambers and valves are working. This procedure takes approximately one hour. There are no restrictions for this procedure.  Your physician has recommended that you wear an 21 day event monitor. Event monitors are medical devices that record the heart's electrical activity. Doctors most often [place] these monitors to diagnose arrhythmias. Arrhythmias are problems with the speed or rhythm of the heartbeat. The monitor is a small, portable device. You can wear one while you do your normal daily activities. This is usually used to diagnose what is causing palpitations/syncope (passing out).  Follow-Up: Your physician recommends that you schedule a follow-up appointment next available after all test are complete with Dr. Johnsie Cancel.   Any Other Special Instructions Will Be Listed Below (If Applicable).     If you need a refill on your cardiac medications before your next appointment, please call your pharmacy.

## 2015-11-27 ENCOUNTER — Ambulatory Visit (INDEPENDENT_AMBULATORY_CARE_PROVIDER_SITE_OTHER): Payer: Managed Care, Other (non HMO)

## 2015-11-27 DIAGNOSIS — R55 Syncope and collapse: Secondary | ICD-10-CM | POA: Diagnosis not present

## 2015-11-27 DIAGNOSIS — R002 Palpitations: Secondary | ICD-10-CM

## 2015-12-02 ENCOUNTER — Telehealth: Payer: Self-pay | Admitting: *Deleted

## 2015-12-02 NOTE — Telephone Encounter (Signed)
Recording from this am 0804 central time was SR@95  bpm, pt pushed the button stating she passed out. [employer] rep//Maria is unable to reach patient so would like [place] to continue to follow up.

## 2015-12-02 NOTE — Telephone Encounter (Signed)
Spoke with pt, she states at that time she pushed the button she was standing at an elevator waiting to get on when she felt her heart racing and it made her feel weak and dizzy. She did not have a syncopal event. She said the feeling quickly passed. Pt was reassured and told her to continue to push the button with any additional symptoms/ pt agreed to plan.

## 2015-12-16 ENCOUNTER — Ambulatory Visit (INDEPENDENT_AMBULATORY_CARE_PROVIDER_SITE_OTHER): Payer: Managed Care, Other (non HMO)

## 2015-12-16 ENCOUNTER — Ambulatory Visit (HOSPITAL_COMMUNITY): Payer: Managed Care, Other (non HMO) | Attending: Cardiovascular Disease

## 2015-12-16 ENCOUNTER — Encounter: Payer: Managed Care, Other (non HMO) | Admitting: Nurse Practitioner

## 2015-12-16 ENCOUNTER — Other Ambulatory Visit: Payer: Self-pay

## 2015-12-16 DIAGNOSIS — R002 Palpitations: Secondary | ICD-10-CM | POA: Diagnosis not present

## 2015-12-16 DIAGNOSIS — I10 Essential (primary) hypertension: Secondary | ICD-10-CM | POA: Diagnosis not present

## 2015-12-16 DIAGNOSIS — R55 Syncope and collapse: Secondary | ICD-10-CM | POA: Insufficient documentation

## 2015-12-16 LAB — EXERCISE TOLERANCE TEST
CHL CUP MPHR: 73 {beats}/min
CHL CUP RESTING HR STRESS: 73 {beats}/min
CHL RATE OF PERCEIVED EXERTION: 17
CSEPEDS: 0 s
CSEPHR: 87 %
Estimated workload: 8.5 METS
Exercise duration (min): 7 min
Peak HR: 153 {beats}/min

## 2016-01-13 ENCOUNTER — Encounter: Payer: Managed Care, Other (non HMO) | Admitting: Cardiovascular Disease
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
## 2016-01-15 ENCOUNTER — Encounter: Payer: Self-pay | Admitting: Cardiovascular Disease

## 2016-07-13 ENCOUNTER — Emergency Department (HOSPITAL_COMMUNITY): Payer: Managed Care, Other (non HMO)

## 2016-07-13 ENCOUNTER — Encounter (HOSPITAL_COMMUNITY): Payer: Self-pay | Admitting: Emergency Medicine

## 2016-07-13 ENCOUNTER — Emergency Department (HOSPITAL_COMMUNITY)
Admission: EM | Admit: 2016-07-13 | Discharge: 2016-07-13 | Disposition: A | Discharge status: Home / Self Care | Payer: Managed Care, Other (non HMO) | Attending: Emergency Medicine | Admitting: Emergency Medicine

## 2016-07-13 DIAGNOSIS — I10 Essential (primary) hypertension: Secondary | ICD-10-CM | POA: Diagnosis not present

## 2016-07-13 DIAGNOSIS — M25561 Pain in right knee: Secondary | ICD-10-CM | POA: Insufficient documentation

## 2016-07-13 DIAGNOSIS — Y999 Unspecified external cause status: Secondary | ICD-10-CM | POA: Insufficient documentation

## 2016-07-13 DIAGNOSIS — Y939 Activity, unspecified: Secondary | ICD-10-CM | POA: Insufficient documentation

## 2016-07-13 DIAGNOSIS — Z8542 Personal history of malignant neoplasm of other parts of uterus: Secondary | ICD-10-CM | POA: Insufficient documentation

## 2016-07-13 DIAGNOSIS — M25571 Pain in right ankle and joints of right foot: Secondary | ICD-10-CM | POA: Insufficient documentation

## 2016-07-13 DIAGNOSIS — M25552 Pain in left hip: Secondary | ICD-10-CM | POA: Diagnosis not present

## 2016-07-13 DIAGNOSIS — Z96611 Presence of right artificial shoulder joint: Secondary | ICD-10-CM | POA: Diagnosis not present

## 2016-07-13 DIAGNOSIS — Y92410 Unspecified street and highway as the place of occurrence of the external cause: Secondary | ICD-10-CM | POA: Diagnosis not present

## 2016-07-13 DIAGNOSIS — F17210 Nicotine dependence, cigarettes, uncomplicated: Secondary | ICD-10-CM | POA: Insufficient documentation

## 2016-07-13 DIAGNOSIS — M545 Low back pain: Secondary | ICD-10-CM | POA: Diagnosis present

## 2016-07-13 DIAGNOSIS — J449 Chronic obstructive pulmonary disease, unspecified: Secondary | ICD-10-CM | POA: Insufficient documentation

## 2016-07-13 MED ORDER — HYDROCODONE-ACETAMINOPHEN 5-325 MG PO TABS
1.0000 | ORAL_TABLET | Freq: Once | ORAL | Status: AC
  Administered 2016-07-13: 1 via ORAL
  Filled 2016-07-13: qty 1

## 2016-07-13 MED ORDER — CYCLOBENZAPRINE HCL 10 MG PO TABS: 10.0000 mg | ORAL_TABLET | Freq: Three times a day (TID) | ORAL | 0 refills | Status: DC | PRN

## 2016-07-13 NOTE — Discharge Instructions (Signed)
Take NSAIDs and tylenol for pain relief. Get plenty of rest.

## 2016-07-13 NOTE — ED Notes (Signed)
Pt in radiology 

## 2016-07-13 NOTE — ED Notes (Signed)
Patient transported to X-ray 
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
## 2016-07-15 ENCOUNTER — Emergency Department (HOSPITAL_COMMUNITY)
Admission: EM | Admit: 2016-07-15 | Discharge: 2016-07-15 | Disposition: A | Discharge status: Home / Self Care | Payer: Managed Care, Other (non HMO) | Attending: Emergency Medicine | Admitting: Emergency Medicine

## 2016-07-15 ENCOUNTER — Encounter (HOSPITAL_COMMUNITY): Payer: Self-pay

## 2016-07-15 DIAGNOSIS — S39012A Strain of muscle, fascia and tendon of lower back, initial encounter: Secondary | ICD-10-CM | POA: Insufficient documentation

## 2016-07-15 DIAGNOSIS — M5440 Lumbago with sciatica, unspecified side: Secondary | ICD-10-CM | POA: Diagnosis not present

## 2016-07-15 DIAGNOSIS — S3992XA Unspecified injury of lower back, initial encounter: Secondary | ICD-10-CM | POA: Diagnosis present

## 2016-07-15 DIAGNOSIS — Z8542 Personal history of malignant neoplasm of other parts of uterus: Secondary | ICD-10-CM | POA: Diagnosis not present

## 2016-07-15 DIAGNOSIS — Z96611 Presence of right artificial shoulder joint: Secondary | ICD-10-CM | POA: Diagnosis not present

## 2016-07-15 DIAGNOSIS — J449 Chronic obstructive pulmonary disease, unspecified: Secondary | ICD-10-CM | POA: Diagnosis not present

## 2016-07-15 DIAGNOSIS — M5441 Lumbago with sciatica, right side: Secondary | ICD-10-CM

## 2016-07-15 DIAGNOSIS — Y939 Activity, unspecified: Secondary | ICD-10-CM | POA: Insufficient documentation

## 2016-07-15 DIAGNOSIS — I10 Essential (primary) hypertension: Secondary | ICD-10-CM | POA: Diagnosis not present

## 2016-07-15 DIAGNOSIS — F17210 Nicotine dependence, cigarettes, uncomplicated: Secondary | ICD-10-CM | POA: Diagnosis not present

## 2016-07-15 DIAGNOSIS — Y92410 Unspecified street and highway as the place of occurrence of the external cause: Secondary | ICD-10-CM | POA: Insufficient documentation

## 2016-07-15 DIAGNOSIS — Y999 Unspecified external cause status: Secondary | ICD-10-CM | POA: Insufficient documentation

## 2016-07-15 DIAGNOSIS — Z79899 Other long term (current) drug therapy: Secondary | ICD-10-CM | POA: Insufficient documentation

## 2016-07-15 DIAGNOSIS — M5442 Lumbago with sciatica, left side: Secondary | ICD-10-CM

## 2016-07-15 MED ORDER — TIZANIDINE HCL 4 MG PO CAPS: 4.0000 mg | ORAL_CAPSULE | Freq: Three times a day (TID) | ORAL | 0 refills | Status: DC

## 2016-07-15 MED ORDER — PREDNISONE 20 MG PO TABS: ORAL_TABLET | ORAL | 0 refills | Status: DC
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
## 2016-07-15 NOTE — Discharge Instructions (Signed)
1. Medications: Zanaflex, prednisone, usual home medications 2. Treatment: rest, drink plenty of fluids, gentle stretching as discussed, alternate ice and heat 3. Follow Up: Please followup with your primary doctor in 3 days for discussion of your diagnoses and further evaluation after today's visit; if you do not have a primary care doctor use the resource guide provided to find one;  Return to the ER for worsening back pain, difficulty walking, loss of bowel or bladder control or other concerning symptoms
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
## 2016-07-15 NOTE — ED Notes (Signed)
Pt states "the medicine they gave me is not working". C/o coninued LBP.

## 2016-08-08 ENCOUNTER — Encounter (HOSPITAL_COMMUNITY): Payer: Self-pay | Admitting: Emergency Medicine

## 2016-08-08 ENCOUNTER — Emergency Department (HOSPITAL_COMMUNITY)
Admission: EM | Admit: 2016-08-08 | Discharge: 2016-08-08 | Disposition: A | Discharge status: Home / Self Care | Payer: No Typology Code available for payment source | Attending: Emergency Medicine | Admitting: Emergency Medicine

## 2016-08-08 DIAGNOSIS — Z96611 Presence of right artificial shoulder joint: Secondary | ICD-10-CM | POA: Insufficient documentation

## 2016-08-08 DIAGNOSIS — J449 Chronic obstructive pulmonary disease, unspecified: Secondary | ICD-10-CM | POA: Insufficient documentation

## 2016-08-08 DIAGNOSIS — G43809 Other migraine, not intractable, without status migrainosus: Secondary | ICD-10-CM | POA: Diagnosis not present

## 2016-08-08 DIAGNOSIS — F17210 Nicotine dependence, cigarettes, uncomplicated: Secondary | ICD-10-CM | POA: Diagnosis not present

## 2016-08-08 DIAGNOSIS — I10 Essential (primary) hypertension: Secondary | ICD-10-CM | POA: Insufficient documentation

## 2016-08-08 DIAGNOSIS — Z8542 Personal history of malignant neoplasm of other parts of uterus: Secondary | ICD-10-CM | POA: Insufficient documentation

## 2016-08-08 DIAGNOSIS — R51 Headache: Secondary | ICD-10-CM | POA: Diagnosis present

## 2016-08-08 MED ORDER — DIPHENHYDRAMINE HCL 50 MG/ML IJ SOLN
12.5000 mg | Freq: Once | INTRAMUSCULAR | Status: AC
  Administered 2016-08-08: 12.5 mg via INTRAVENOUS
  Filled 2016-08-08: qty 1

## 2016-08-08 MED ORDER — KETOROLAC TROMETHAMINE 30 MG/ML IJ SOLN
30.0000 mg | Freq: Once | INTRAMUSCULAR | Status: AC
  Administered 2016-08-08: 30 mg via INTRAVENOUS
  Filled 2016-08-08: qty 1

## 2016-08-08 MED ORDER — DEXAMETHASONE SODIUM PHOSPHATE 10 MG/ML IJ SOLN
10.0000 mg | Freq: Once | INTRAMUSCULAR | Status: AC
  Administered 2016-08-08: 10 mg via INTRAVENOUS
  Filled 2016-08-08: qty 1

## 2016-08-08 MED ORDER — METOCLOPRAMIDE HCL 5 MG/ML IJ SOLN
10.0000 mg | Freq: Once | INTRAMUSCULAR | Status: AC
  Administered 2016-08-08: 10 mg via INTRAVENOUS
  Filled 2016-08-08: qty 2
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
## 2016-08-08 NOTE — Discharge Instructions (Signed)
Continued physical therapy and taking ibuprofen as needed for pain. Please follow up with her primary care doctor. Return to the ED if you experience inability to walk, loss of control your bowels or bladder, change in her vision, vomiting, numbness or tingling.
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
## 2016-08-08 NOTE — ED Notes (Signed)
Patient able to ambulate independently  

## 2016-08-08 NOTE — ED Notes (Signed)
PA at bedside.

## 2016-11-10 ENCOUNTER — Other Ambulatory Visit (HOSPITAL_BASED_OUTPATIENT_CLINIC_OR_DEPARTMENT_OTHER): Payer: Managed Care, Other (non HMO)

## 2016-11-10 ENCOUNTER — Ambulatory Visit (HOSPITAL_BASED_OUTPATIENT_CLINIC_OR_DEPARTMENT_OTHER): Payer: Self-pay | Admitting: Hematology & Oncology

## 2016-11-10 ENCOUNTER — Ambulatory Visit: Payer: Managed Care, Other (non HMO)

## 2016-11-10 VITALS — BP 161/103 | HR 78 | Temp 98.0°F | Resp 20 | Wt 187.8 lb

## 2016-11-10 DIAGNOSIS — D508 Other iron deficiency anemias: Secondary | ICD-10-CM

## 2016-11-10 DIAGNOSIS — D513 Other dietary vitamin B12 deficiency anemia: Secondary | ICD-10-CM | POA: Diagnosis not present

## 2016-11-10 DIAGNOSIS — M87061 Idiopathic aseptic necrosis of right tibia: Secondary | ICD-10-CM

## 2016-11-10 LAB — CBC WITH DIFFERENTIAL (CANCER CENTER ONLY)
BASO#: 0 10*3/uL (ref 0.0–0.2)
BASO%: 0.3 % (ref 0.0–2.0)
EOS ABS: 0.1 10*3/uL (ref 0.0–0.5)
EOS%: 1.7 % (ref 0.0–7.0)
HCT: 40.4 % (ref 34.8–46.6)
HGB: 14 g/dL (ref 11.6–15.9)
LYMPH#: 1.9 10*3/uL (ref 0.9–3.3)
LYMPH%: 29.9 % (ref 14.0–48.0)
MCH: 27.2 pg (ref 26.0–34.0)
MCHC: 34.7 g/dL (ref 32.0–36.0)
MCV: 79 fL — AB (ref 81–101)
MONO#: 0.6 10*3/uL (ref 0.1–0.9)
MONO%: 8.5 % (ref 0.0–13.0)
NEUT#: 3.9 10*3/uL (ref 1.5–6.5)
NEUT%: 59.6 % (ref 39.6–80.0)
PLATELETS: 409 10*3/uL — AB (ref 145–400)
RBC: 5.14 10*6/uL (ref 3.70–5.32)
RDW: 15.3 % (ref 11.1–15.7)
WBC: 6.5 10*3/uL (ref 3.9–10.0)

## 2016-11-10 LAB — CHCC SATELLITE - SMEAR

## 2016-11-10 NOTE — Progress Notes (Signed)
I went in to see Autumn Gordon at 4:05PM and there ws no one in the room.  I went out to the waiting area and no one was out there.  Orson Slick, our scheduler, said that she and her friend walked out of the clinic.  I told Orson Slick to call them and see if we could re-schedule.  I am somewhat flustered because her appt was at 4 PM and I was only 5 minutes late.  Apparently, Orson Slick tried to call her and could not get an answer.  I am going to pray for her.  Lattie Haw, MD

## 2016-11-11 LAB — IRON AND TIBC
%SAT: 9 % — ABNORMAL LOW (ref 21–57)
Iron: 37 ug/dL — ABNORMAL LOW (ref 41–142)
TIBC: 415 ug/dL (ref 236–444)
UIBC: 378 ug/dL (ref 120–384)

## 2016-11-11 LAB — FERRITIN: FERRITIN: 14 ng/mL (ref 9–269)

## 2016-11-15 LAB — HEMOGLOBINOPATHY EVALUATION
HEMOGLOBIN A2 QUANTITATION: 1.4 % — AB (ref 1.8–3.2)
HEMOGLOBIN F QUANTITATION: 0 % (ref 0.0–2.0)
HGB A: 97.1 % (ref 96.4–98.8)
HGB C: 0 %
HGB S: 0 %
HGB VARIANT: 1.5 %

## 2016-11-15 LAB — PROTEIN ELECTROPHORESIS, SERUM, WITH REFLEX
A/G Ratio: 1.2 (ref 0.7–1.7)
ALBUMIN: 4.2 g/dL (ref 2.9–4.4)
ALPHA 1: 0.3 g/dL (ref 0.0–0.4)
ALPHA 2: 0.8 g/dL (ref 0.4–1.0)
Beta: 1.2 g/dL (ref 0.7–1.3)
GAMMA GLOBULIN: 1.1 g/dL (ref 0.4–1.8)
Globulin, Total: 3.4 g/dL (ref 2.2–3.9)
TOTAL PROTEIN: 7.6 g/dL (ref 6.0–8.5)

## 2016-11-17 LAB — HEMOCHROMATOSIS DNA-PCR(C282Y,H63D)

## 2016-11-25 ENCOUNTER — Ambulatory Visit (HOSPITAL_BASED_OUTPATIENT_CLINIC_OR_DEPARTMENT_OTHER): Payer: Managed Care, Other (non HMO) | Admitting: Hematology & Oncology

## 2016-11-25 VITALS — BP 149/102 | HR 91 | Temp 98.8°F | Resp 18 | Wt 187.1 lb

## 2016-11-25 DIAGNOSIS — M8700 Idiopathic aseptic necrosis of unspecified bone: Secondary | ICD-10-CM

## 2016-11-25 DIAGNOSIS — M8709 Idiopathic aseptic necrosis of bone, multiple sites: Secondary | ICD-10-CM
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
## 2016-12-06 ENCOUNTER — Encounter (HOSPITAL_COMMUNITY)
Admission: RE | Admit: 2016-12-06 | Discharge: 2016-12-06 | Disposition: A | Discharge status: Home / Self Care | Payer: Managed Care, Other (non HMO) | Source: Ambulatory Visit | Attending: Hematology & Oncology | Admitting: Hematology & Oncology

## 2016-12-06 DIAGNOSIS — M8700 Idiopathic aseptic necrosis of unspecified bone: Secondary | ICD-10-CM

## 2016-12-06 DIAGNOSIS — M8709 Idiopathic aseptic necrosis of bone, multiple sites: Secondary | ICD-10-CM | POA: Diagnosis not present

## 2016-12-06 MED ORDER — TECHNETIUM TC 99M MEDRONATE IV KIT
21.5000 | PACK | Freq: Once | INTRAVENOUS | Status: AC | PRN
  Administered 2016-12-06: 21.5 via INTRAVENOUS

## 2016-12-15 ENCOUNTER — Other Ambulatory Visit: Payer: Self-pay | Admitting: Hematology & Oncology

## 2016-12-15 DIAGNOSIS — M899 Disorder of bone, unspecified: Secondary | ICD-10-CM

## 2016-12-19 ENCOUNTER — Other Ambulatory Visit: Payer: Self-pay | Admitting: Family

## 2016-12-19 DIAGNOSIS — M899 Disorder of bone, unspecified: Secondary | ICD-10-CM

## 2016-12-22 ENCOUNTER — Ambulatory Visit (HOSPITAL_COMMUNITY): Admission: RE | Admit: 2016-12-22 | Payer: Managed Care, Other (non HMO) | Source: Ambulatory Visit

## 2016-12-29 ENCOUNTER — Ambulatory Visit (HOSPITAL_COMMUNITY)
Admission: RE | Admit: 2016-12-29 | Discharge: 2016-12-29 | Disposition: A | Discharge status: Home / Self Care | Payer: Managed Care, Other (non HMO) | Source: Ambulatory Visit | Attending: Family | Admitting: Family

## 2016-12-29 DIAGNOSIS — I639 Cerebral infarction, unspecified: Secondary | ICD-10-CM | POA: Insufficient documentation

## 2016-12-29 DIAGNOSIS — M899 Disorder of bone, unspecified: Secondary | ICD-10-CM | POA: Diagnosis not present

## 2016-12-29 MED ORDER — IOPAMIDOL (ISOVUE-300) INJECTION 61%
75.0000 mL | Freq: Once | INTRAVENOUS | Status: AC | PRN
  Administered 2016-12-29: 75 mL via INTRAVENOUS

## 2016-12-29 MED ORDER — IOPAMIDOL (ISOVUE-300) INJECTION 61%
INTRAVENOUS | Status: DC
Start: 2016-12-29 — End: 2016-12-30
  Filled 2016-12-29: qty 75

## 2016-12-29 MED ORDER — SODIUM CHLORIDE 0.9 % IJ SOLN
INTRAMUSCULAR | Status: AC
  Filled 2016-12-29: qty 50

## 2017-01-02 ENCOUNTER — Other Ambulatory Visit (HOSPITAL_BASED_OUTPATIENT_CLINIC_OR_DEPARTMENT_OTHER): Payer: Managed Care, Other (non HMO)

## 2017-01-02 ENCOUNTER — Ambulatory Visit (HOSPITAL_BASED_OUTPATIENT_CLINIC_OR_DEPARTMENT_OTHER): Payer: Managed Care, Other (non HMO) | Admitting: Hematology & Oncology

## 2017-01-02 VITALS — BP 140/87 | HR 90 | Temp 98.2°F | Resp 18 | Wt 187.0 lb

## 2017-01-02 DIAGNOSIS — M87051 Idiopathic aseptic necrosis of right femur: Secondary | ICD-10-CM

## 2017-01-02 DIAGNOSIS — M8700 Idiopathic aseptic necrosis of unspecified bone: Secondary | ICD-10-CM
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
## 2017-01-03 LAB — LIPID PANEL
Chol/HDL Ratio: 5.1 ratio units — ABNORMAL HIGH (ref 0.0–4.4)
Cholesterol, Total: 199 mg/dL (ref 100–199)
HDL: 39 mg/dL — AB (ref >39)
LDL Calculated: 138 mg/dL — ABNORMAL HIGH (ref 0–99)
Triglycerides: 109 mg/dL (ref 0–149)
VLDL Cholesterol Cal: 22 mg/dL (ref 5–40)

## 2017-01-03 LAB — HIV ANTIBODY (ROUTINE TESTING W REFLEX): HIV SCREEN 4TH GENERATION: NONREACTIVE

## 2017-01-03 LAB — RHEUMATOID FACTOR: RA Latex Turbid.: 11.9 IU/mL (ref 0.0–13.9)

## 2017-01-03 LAB — ANTINUCLEAR ANTIBODIES, IFA: ANTINUCLEAR ANTIBODIES, IFA: NEGATIVE

## 2017-01-07 LAB — THYROGLOBULIN LEVEL: THYROGLOBULIN (TG-RIA): 19 ng/mL

## 2017-06-30 ENCOUNTER — Emergency Department (HOSPITAL_COMMUNITY)
Admission: EM | Admit: 2017-06-30 | Discharge: 2017-06-30 | Disposition: A | Discharge status: Home / Self Care | Payer: Managed Care, Other (non HMO) | Attending: Emergency Medicine | Admitting: Emergency Medicine

## 2017-06-30 ENCOUNTER — Encounter (HOSPITAL_COMMUNITY): Payer: Self-pay | Admitting: Emergency Medicine

## 2017-06-30 DIAGNOSIS — F17210 Nicotine dependence, cigarettes, uncomplicated: Secondary | ICD-10-CM | POA: Diagnosis not present

## 2017-06-30 DIAGNOSIS — Z79899 Other long term (current) drug therapy: Secondary | ICD-10-CM | POA: Diagnosis not present

## 2017-06-30 DIAGNOSIS — J449 Chronic obstructive pulmonary disease, unspecified: Secondary | ICD-10-CM | POA: Diagnosis not present

## 2017-06-30 DIAGNOSIS — I10 Essential (primary) hypertension: Secondary | ICD-10-CM | POA: Insufficient documentation

## 2017-06-30 DIAGNOSIS — G43901 Migraine, unspecified, not intractable, with status migrainosus: Secondary | ICD-10-CM | POA: Insufficient documentation

## 2017-06-30 DIAGNOSIS — G43801 Other migraine, not intractable, with status migrainosus: Secondary | ICD-10-CM

## 2017-06-30 DIAGNOSIS — R51 Headache: Secondary | ICD-10-CM | POA: Diagnosis present

## 2017-06-30 LAB — POC URINE PREG, ED: Preg Test, Ur: NEGATIVE

## 2017-06-30 MED ORDER — MAGNESIUM SULFATE 2 GM/50ML IV SOLN
2.0000 g | Freq: Once | INTRAVENOUS | Status: AC
  Administered 2017-06-30: 2 g via INTRAVENOUS
  Filled 2017-06-30: qty 50

## 2017-06-30 MED ORDER — SODIUM CHLORIDE 0.9 % IV BOLUS (SEPSIS)
1000.0000 mL | Freq: Once | INTRAVENOUS | Status: AC
  Administered 2017-06-30: 1000 mL via INTRAVENOUS

## 2017-06-30 MED ORDER — PROCHLORPERAZINE EDISYLATE 5 MG/ML IJ SOLN
10.0000 mg | Freq: Once | INTRAMUSCULAR | Status: AC
  Administered 2017-06-30: 10 mg via INTRAVENOUS
  Filled 2017-06-30: qty 2

## 2017-06-30 MED ORDER — DIPHENHYDRAMINE HCL 50 MG/ML IJ SOLN
25.0000 mg | Freq: Once | INTRAMUSCULAR | Status: AC
  Administered 2017-06-30: 25 mg via INTRAVENOUS
  Filled 2017-06-30: qty 1

## 2017-06-30 MED ORDER — DEXAMETHASONE SODIUM PHOSPHATE 10 MG/ML IJ SOLN
10.0000 mg | Freq: Once | INTRAMUSCULAR | Status: AC
  Administered 2017-06-30: 10 mg via INTRAVENOUS
  Filled 2017-06-30: qty 1

## 2017-06-30 MED ORDER — KETOROLAC TROMETHAMINE 30 MG/ML IJ SOLN
30.0000 mg | Freq: Once | INTRAMUSCULAR | Status: AC
  Administered 2017-06-30: 30 mg via INTRAVENOUS
  Filled 2017-06-30: qty 1

## 2017-06-30 NOTE — ED Triage Notes (Signed)
Patient reports migraine headache onset last night with emesis and photophobia unrelieved by OTC Ibuprofen .
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
## 2017-07-05 IMAGING — DX DG CHEST 2V
2 series · 2 of 2 positions shown · non-contrast
Comparison: 03/12/2015.

CLINICAL DATA: Chest pain and pressure. Fatigue for 7 hours.
Initial encounter.

EXAM:
CHEST  2 VIEW

[chest pa]
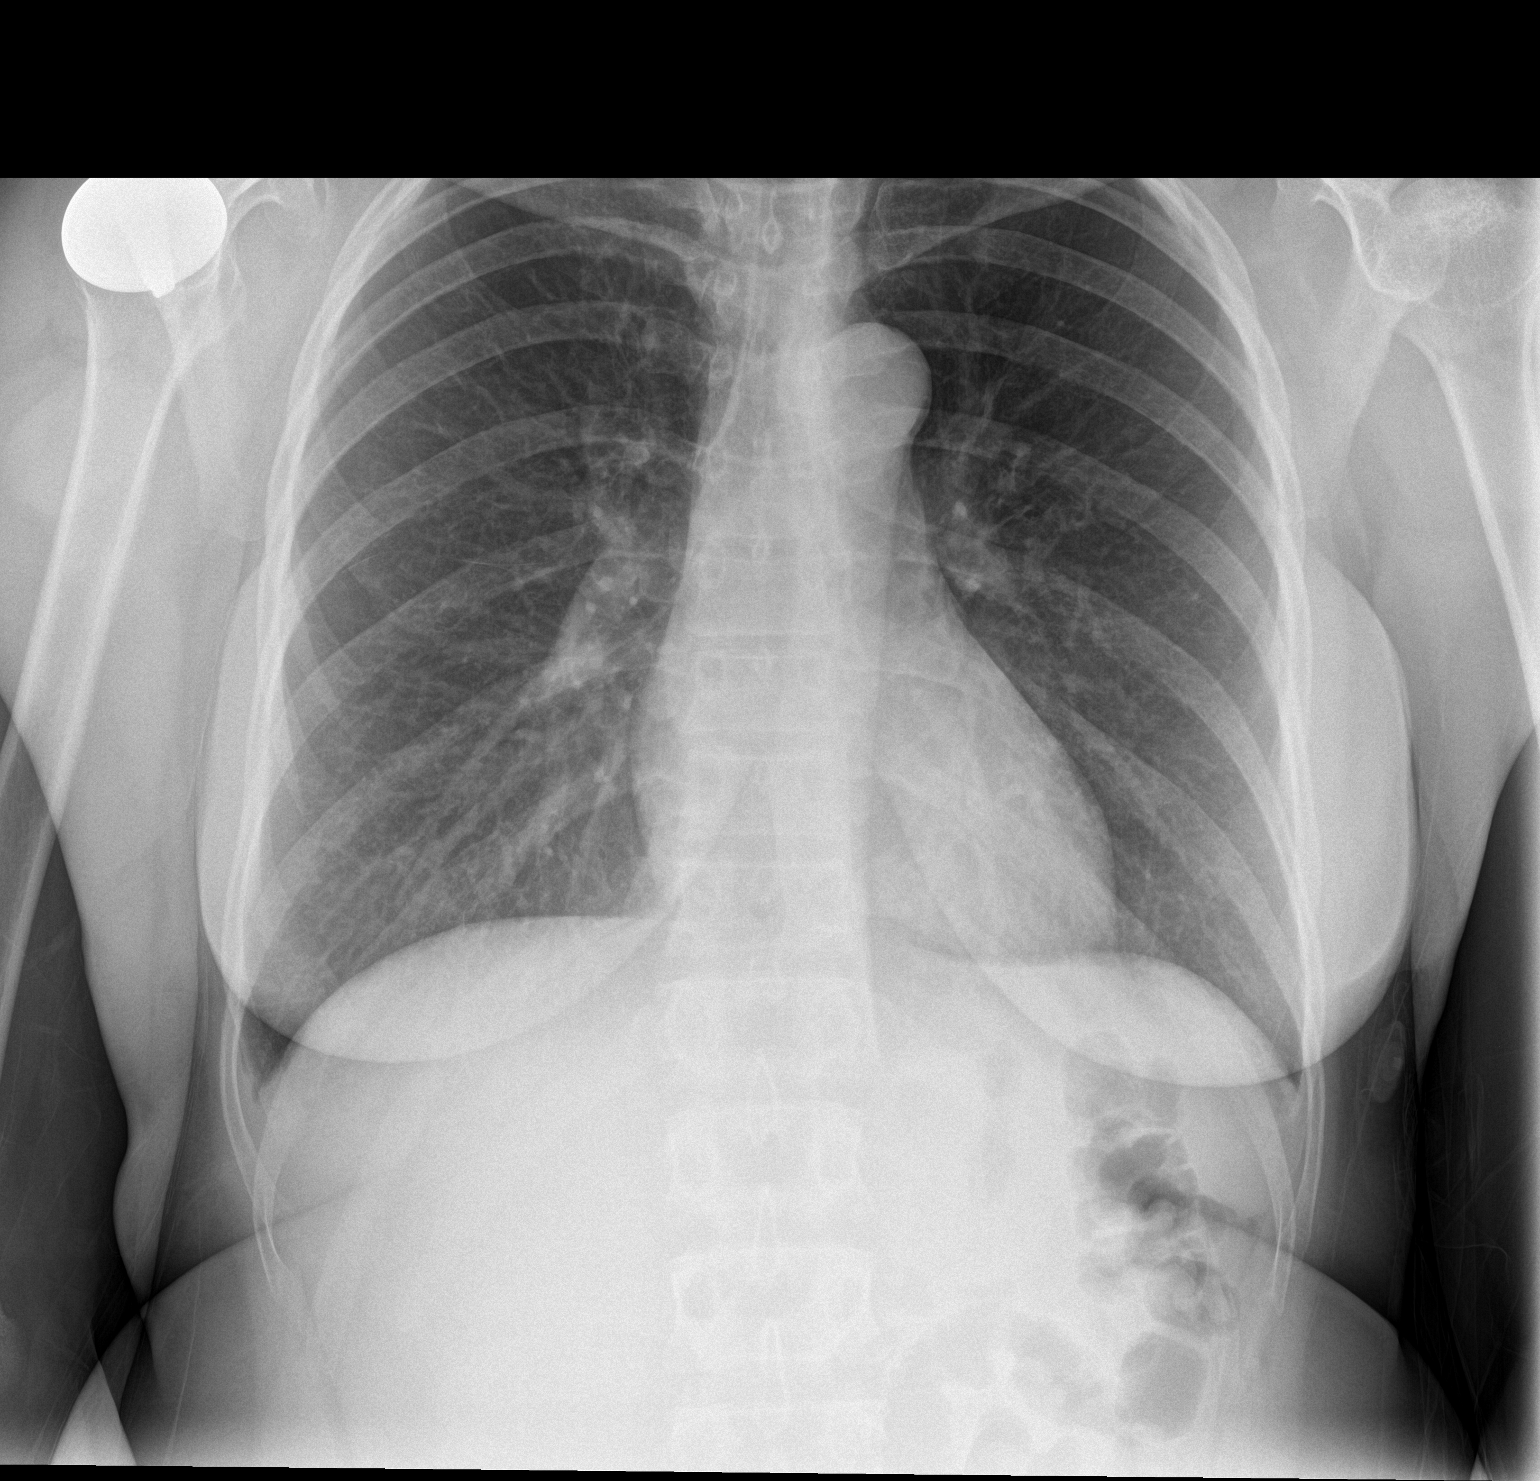

[chest lat]
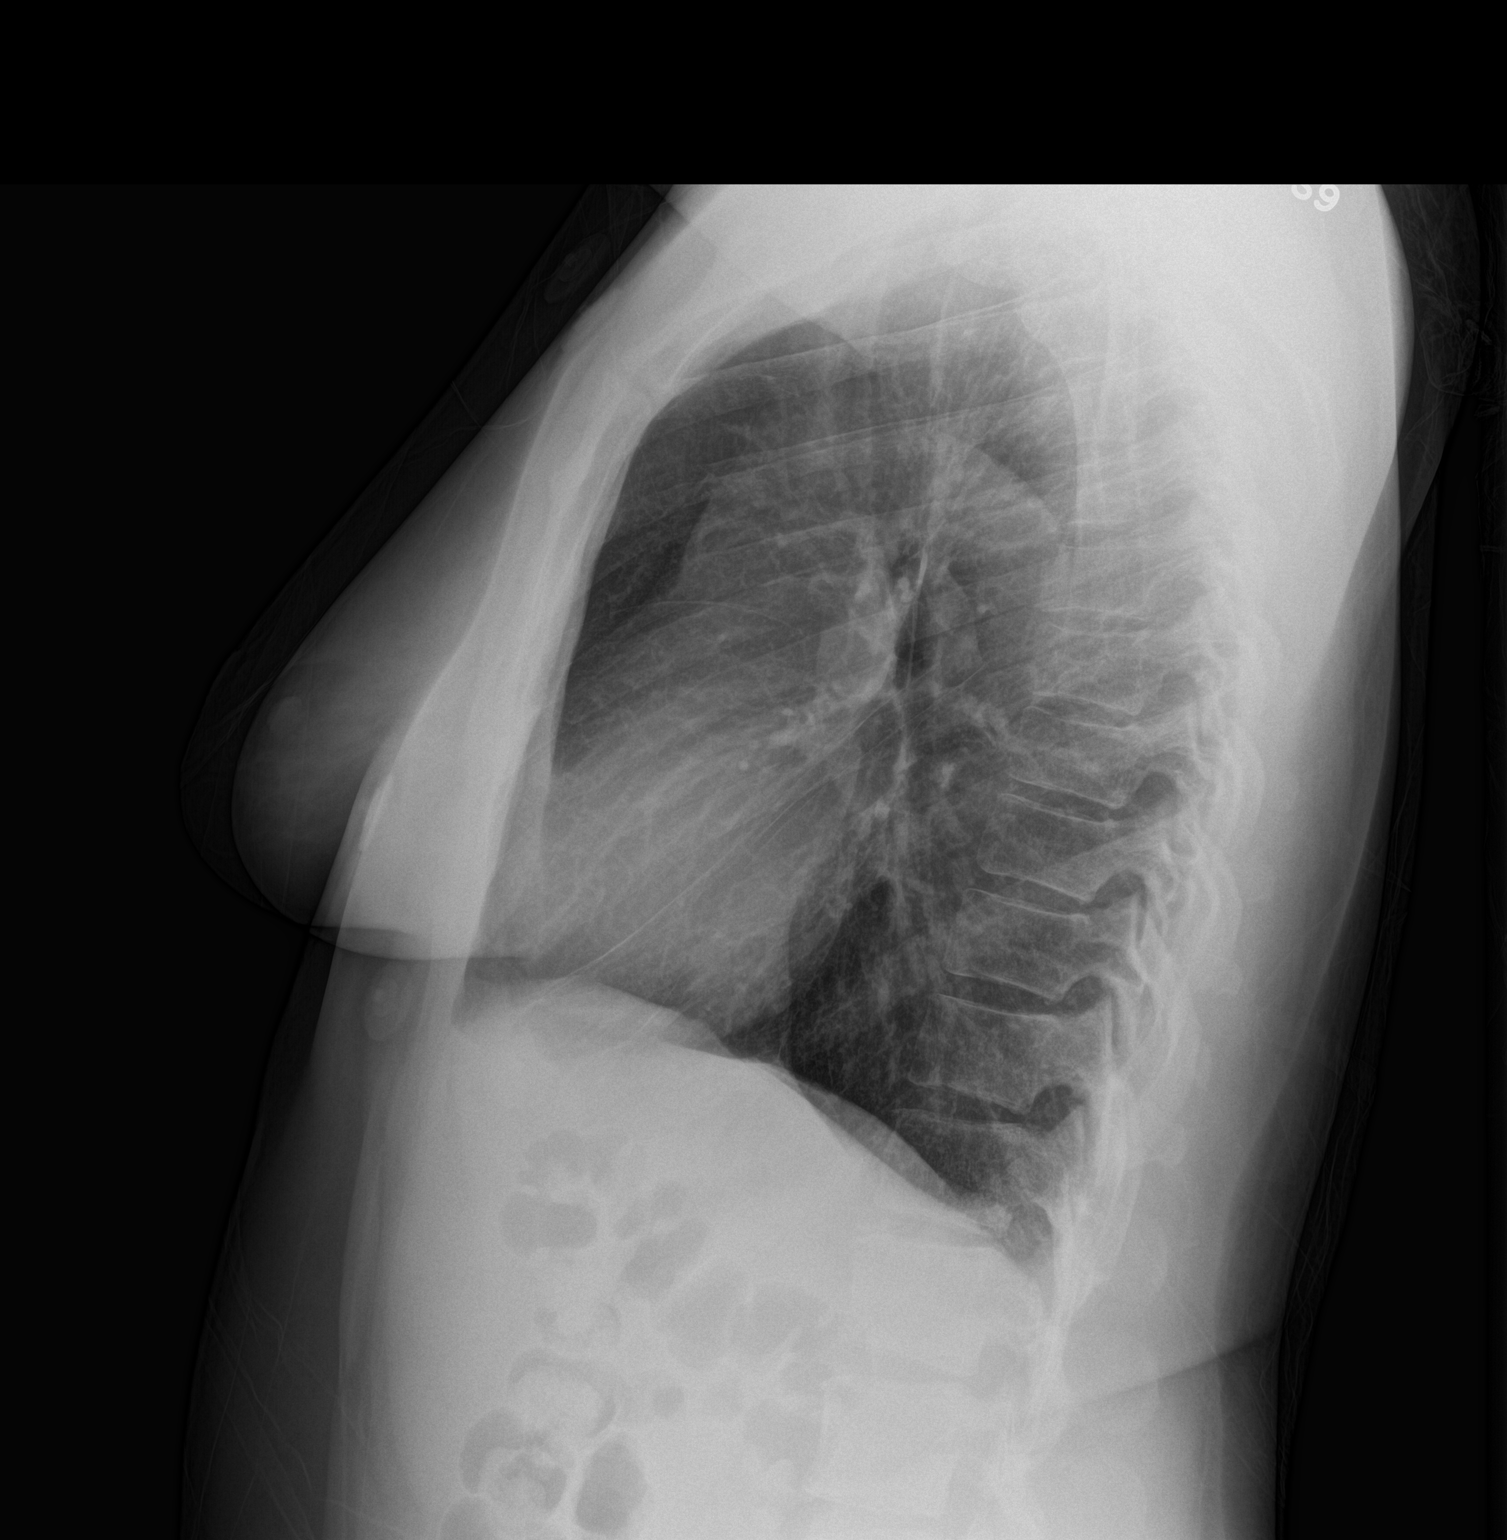

[2 of 2 positions shown; findings below may reference images not displayed]

FINDINGS: Cardiopericardial silhouette within normal limits. Mediastinal
contours normal. Trachea midline. No airspace disease or effusion.
RIGHT shoulder hemiarthroplasty.
IMPRESSION: No active cardiopulmonary disease.

## 2018-03-09 ENCOUNTER — Encounter (HOSPITAL_COMMUNITY): Payer: Self-pay

## 2018-03-09 ENCOUNTER — Emergency Department (HOSPITAL_COMMUNITY)
Admission: EM | Admit: 2018-03-09 | Discharge: 2018-03-09 | Disposition: A | Discharge status: Home / Self Care | Payer: Managed Care, Other (non HMO) | Attending: Emergency Medicine | Admitting: Emergency Medicine

## 2018-03-09 DIAGNOSIS — R109 Unspecified abdominal pain: Secondary | ICD-10-CM | POA: Diagnosis present

## 2018-03-09 DIAGNOSIS — Z5321 Procedure and treatment not carried out due to patient leaving prior to being seen by health care provider: Secondary | ICD-10-CM | POA: Insufficient documentation

## 2018-03-09 LAB — CBC WITH DIFFERENTIAL/PLATELET
Basophils Absolute: 0 10*3/uL (ref 0.0–0.1)
Basophils Relative: 0 %
Eosinophils Absolute: 0.2 10*3/uL (ref 0.0–0.7)
Eosinophils Relative: 3 %
HEMATOCRIT: 40.6 % (ref 36.0–46.0)
HEMOGLOBIN: 13.8 g/dL (ref 12.0–15.0)
LYMPHS ABS: 2.5 10*3/uL (ref 0.7–4.0)
Lymphocytes Relative: 43 %
MCH: 28.6 pg (ref 26.0–34.0)
MCHC: 34 g/dL (ref 30.0–36.0)
MCV: 84.1 fL (ref 78.0–100.0)
MONO ABS: 0.4 10*3/uL (ref 0.1–1.0)
MONOS PCT: 7 %
NEUTROS PCT: 47 %
Neutro Abs: 2.7 10*3/uL (ref 1.7–7.7)
Platelets: 414 10*3/uL — ABNORMAL HIGH (ref 150–400)
RBC: 4.83 MIL/uL (ref 3.87–5.11)
RDW: 14.5 % (ref 11.5–15.5)
WBC: 5.8 10*3/uL (ref 4.0–10.5)

## 2018-03-09 LAB — I-STAT BETA HCG BLOOD, ED (MC, WL, AP ONLY)

## 2018-03-09 NOTE — ED Triage Notes (Signed)
Patient complains of increased lower pelvic pain and lower back pain x 2 days. States that she has bad menstrual cramps and this is the worst it has been in several months, has had ablation for same. No other associated symptoms

## 2018-10-03 ENCOUNTER — Other Ambulatory Visit: Payer: Self-pay | Admitting: Family Medicine

## 2018-10-03 DIAGNOSIS — Z1231 Encounter for screening mammogram for malignant neoplasm of breast: Secondary | ICD-10-CM

## 2018-12-06 ENCOUNTER — Ambulatory Visit: Payer: Managed Care, Other (non HMO)

## 2020-06-03 ENCOUNTER — Other Ambulatory Visit: Payer: Self-pay | Admitting: Family

## 2020-06-03 DIAGNOSIS — Z1231 Encounter for screening mammogram for malignant neoplasm of breast: Secondary | ICD-10-CM

## 2020-06-18 ENCOUNTER — Telehealth: Payer: Self-pay

## 2020-06-18 NOTE — Telephone Encounter (Signed)
Spoke with appointment clerk and she looked at Autumn Casco, NP notes  office notes and stated that she is to be referred to gyn for birth control.

## 2020-07-06 ENCOUNTER — Ambulatory Visit: Payer: No Typology Code available for payment source | Admitting: Obstetrics and Gynecology

## 2020-07-31 ENCOUNTER — Other Ambulatory Visit: Payer: Self-pay

## 2020-07-31 ENCOUNTER — Other Ambulatory Visit: Payer: Medicaid Other

## 2020-07-31 DIAGNOSIS — Z20822 Contact with and (suspected) exposure to covid-19: Secondary | ICD-10-CM

## 2020-08-03 LAB — NOVEL CORONAVIRUS, NAA: SARS-CoV-2, NAA: NOT DETECTED

## 2021-05-12 ENCOUNTER — Emergency Department (HOSPITAL_COMMUNITY): Payer: Medicaid Other

## 2021-05-12 ENCOUNTER — Emergency Department (HOSPITAL_COMMUNITY)
Admission: EM | Admit: 2021-05-12 | Discharge: 2021-05-12 | Disposition: A | Discharge status: Home / Self Care | Payer: Medicaid Other | Attending: Emergency Medicine | Admitting: Emergency Medicine

## 2021-05-12 ENCOUNTER — Encounter (HOSPITAL_COMMUNITY): Payer: Self-pay

## 2021-05-12 ENCOUNTER — Other Ambulatory Visit: Payer: Self-pay

## 2021-05-12 DIAGNOSIS — Z96611 Presence of right artificial shoulder joint: Secondary | ICD-10-CM | POA: Insufficient documentation

## 2021-05-12 DIAGNOSIS — J449 Chronic obstructive pulmonary disease, unspecified: Secondary | ICD-10-CM | POA: Diagnosis not present

## 2021-05-12 DIAGNOSIS — J45909 Unspecified asthma, uncomplicated: Secondary | ICD-10-CM | POA: Diagnosis not present

## 2021-05-12 DIAGNOSIS — R1031 Right lower quadrant pain: Secondary | ICD-10-CM | POA: Diagnosis present

## 2021-05-12 DIAGNOSIS — Z8541 Personal history of malignant neoplasm of cervix uteri: Secondary | ICD-10-CM | POA: Insufficient documentation

## 2021-05-12 DIAGNOSIS — R112 Nausea with vomiting, unspecified: Secondary | ICD-10-CM | POA: Insufficient documentation

## 2021-05-12 DIAGNOSIS — F17210 Nicotine dependence, cigarettes, uncomplicated: Secondary | ICD-10-CM | POA: Insufficient documentation

## 2021-05-12 DIAGNOSIS — N9489 Other specified conditions associated with female genital organs and menstrual cycle: Secondary | ICD-10-CM | POA: Insufficient documentation

## 2021-05-12 DIAGNOSIS — N939 Abnormal uterine and vaginal bleeding, unspecified: Secondary | ICD-10-CM | POA: Insufficient documentation

## 2021-05-12 LAB — URINALYSIS, ROUTINE W REFLEX MICROSCOPIC
Bacteria, UA: NONE SEEN
Bilirubin Urine: NEGATIVE
Glucose, UA: NEGATIVE mg/dL
Ketones, ur: NEGATIVE mg/dL
Nitrite: NEGATIVE
Protein, ur: 100 mg/dL — AB
Specific Gravity, Urine: >1.046 — ABNORMAL HIGH (ref 1.005–1.030)
pH: 5 (ref 5.0–8.0)

## 2021-05-12 LAB — CBC WITH DIFFERENTIAL/PLATELET
Abs Immature Granulocytes: 0.02 K/uL (ref 0.00–0.07)
Basophils Absolute: 0 K/uL (ref 0.0–0.1)
Basophils Relative: 0 %
Eosinophils Absolute: 0 K/uL (ref 0.0–0.5)
Eosinophils Relative: 1 %
HCT: 44.6 % (ref 36.0–46.0)
Hemoglobin: 14.5 g/dL (ref 12.0–15.0)
Immature Granulocytes: 0 %
Lymphocytes Relative: 14 %
Lymphs Abs: 1.1 K/uL (ref 0.7–4.0)
MCH: 28.7 pg (ref 26.0–34.0)
MCHC: 32.5 g/dL (ref 30.0–36.0)
MCV: 88.1 fL (ref 80.0–100.0)
Monocytes Absolute: 0.3 K/uL (ref 0.1–1.0)
Monocytes Relative: 4 %
Neutro Abs: 6.2 K/uL (ref 1.7–7.7)
Neutrophils Relative %: 81 %
Platelets: 386 K/uL (ref 150–400)
RBC: 5.06 MIL/uL (ref 3.87–5.11)
RDW: 13.3 % (ref 11.5–15.5)
WBC: 7.6 K/uL (ref 4.0–10.5)
nRBC: 0 % (ref 0.0–0.2)

## 2021-05-12 LAB — COMPREHENSIVE METABOLIC PANEL WITH GFR
ALT: 26 U/L (ref 0–44)
AST: 49 U/L — ABNORMAL HIGH (ref 15–41)
Albumin: 4.3 g/dL (ref 3.5–5.0)
Alkaline Phosphatase: 46 U/L (ref 38–126)
Anion gap: 6 (ref 5–15)
BUN: 9 mg/dL (ref 6–20)
CO2: 20 mmol/L — ABNORMAL LOW (ref 22–32)
Calcium: 9.3 mg/dL (ref 8.9–10.3)
Chloride: 108 mmol/L (ref 98–111)
Creatinine, Ser: 0.72 mg/dL (ref 0.44–1.00)
GFR, Estimated: >60 mL/min
Glucose, Bld: 103 mg/dL — ABNORMAL HIGH (ref 70–99)
Potassium: 5.6 mmol/L — ABNORMAL HIGH (ref 3.5–5.1)
Sodium: 134 mmol/L — ABNORMAL LOW (ref 135–145)
Total Bilirubin: 0.6 mg/dL (ref 0.3–1.2)
Total Protein: 7.3 g/dL (ref 6.5–8.1)

## 2021-05-12 LAB — I-STAT BETA HCG BLOOD, ED (MC, WL, AP ONLY): I-stat hCG, quantitative: <5 m[IU]/mL (ref <5)

## 2021-05-12 LAB — WET PREP, GENITAL
Clue Cells Wet Prep HPF POC: NONE SEEN
Sperm: NONE SEEN
Yeast Wet Prep HPF POC: NONE SEEN

## 2021-05-12 LAB — POTASSIUM: Potassium: 4 mmol/L (ref 3.5–5.1)

## 2021-05-12 MED ORDER — IOHEXOL 300 MG/ML  SOLN
100.0000 mL | Freq: Once | INTRAMUSCULAR | Status: AC | PRN
  Administered 2021-05-12: 100 mL via INTRAVENOUS

## 2021-05-12 MED ORDER — DICLOFENAC SODIUM 75 MG PO TBEC: 75.0000 mg | DELAYED_RELEASE_TABLET | Freq: Two times a day (BID) | ORAL | 0 refills | Status: DC

## 2021-05-12 MED ORDER — SODIUM CHLORIDE 0.9 % IV BOLUS
1000.0000 mL | Freq: Once | INTRAVENOUS | Status: AC
  Administered 2021-05-12: 1000 mL via INTRAVENOUS

## 2021-05-12 MED ORDER — MORPHINE SULFATE (PF) 4 MG/ML IV SOLN
4.0000 mg | Freq: Once | INTRAVENOUS | Status: AC
  Administered 2021-05-12: 15:00:00 4 mg via INTRAVENOUS
  Filled 2021-05-12: qty 1

## 2021-05-12 MED ORDER — METRONIDAZOLE 500 MG PO TABS: 500.0000 mg | ORAL_TABLET | Freq: Two times a day (BID) | ORAL | 0 refills | Status: AC

## 2021-05-12 MED ORDER — KETOROLAC TROMETHAMINE 30 MG/ML IJ SOLN
30.0000 mg | Freq: Once | INTRAMUSCULAR | Status: AC
  Administered 2021-05-12: 30 mg via INTRAVENOUS
  Filled 2021-05-12: qty 1

## 2021-05-12 MED ORDER — ONDANSETRON 4 MG PO TBDP
4.0000 mg | ORAL_TABLET | Freq: Once | ORAL | Status: AC
Start: 2021-05-12 — End: 2021-05-12
  Administered 2021-05-12: 13:00:00 4 mg via ORAL
  Filled 2021-05-12: qty 1

## 2021-05-12 NOTE — ED Triage Notes (Signed)
Pt presents with RLQ pain starting 0400 this am. Pt reports having a uterine ablation several years ago. Pt is concerned for a tubal pregnancy. Pt also reports vaginal bleeding, using several pads, denies clots

## 2021-05-12 NOTE — ED Provider Notes (Signed)
Pt's care assumed at 4pm.  Ct scan pending.   Pt's wet prep shows trichomonas and clue cells.   Ct shows fibroids and endometrial thickening.  Radiology advised MRi. Pt feels better after morphine but still has discomfort.  Pt reports she has had a endometrial ablation in the past.  Pt reports she continues to have severe abdominal pain and bleeding heavily every month.  Pt reports this feels like her typical pain  Pt given torodol 30 mg iv.  Pt advised she needs further imaging and gyn evaluation.  Pt given rx for flagyl 500mg  bid and voltaren.  Referral to women's health and [facility] for follow up.   Fransico Meadow, [place] 05/12/21 1825    Valarie Merino, MD 05/13/21 [id number]

## 2021-05-12 NOTE — ED Provider Notes (Signed)
Emergency Medicine Provider Triage Evaluation Note  Autumn Gordon , a 50 y.o. female  was evaluated in triage.  Pt complains of RLQ abdominal pain.  Patient endorses history of endometrial ablation a couple of years ago.  Since then, she has been having dysmenorrhea.  She often has pain leading up to her menses and it becomes more severe at onset of menses.  At 4 AM this morning she woke up and had severe right lower quadrant abdominal pain and onset of her menses.  She also has associated nausea and vomiting.  She expressed concern for ectopic pregnancy.  Most recent sexual intercourse was over two months ago.   No other abdominal surgeries aside from endometrial ablation.   Review of Systems  Positive: RLQ abdominal pain, nausea Negative: Fevers, vomiting  Physical Exam  BP (!) 129/95 (BP Location: Right Arm)   Pulse 80   Temp 97.6 F (36.4 C) (Oral)   Resp 17   SpO2 100%  Gen:   Awake, no distress   Resp:  Normal effort  MSK:   Moves extremities without difficulty  Other:  Abd: Tenderness in RLQ. Soft, nondistended.  Medical Decision Making  Medically screening exam initiated at 10:24 AM.  Appropriate orders placed.  Detra Burton-McCaleb was informed that the remainder of the evaluation will be completed by another provider, this initial triage assessment does not replace that evaluation, and the importance of remaining in the ED until their evaluation is complete.   Corena Herter, PA-C 05/12/21 1041    Sherwood Gambler, MD 05/15/21 1517
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
## 2021-05-13 LAB — GC/CHLAMYDIA PROBE AMP (~~LOC~~) NOT AT ARMC
Chlamydia: NEGATIVE
Comment: NEGATIVE
Comment: NORMAL
Neisseria Gonorrhea: NEGATIVE

## 2021-08-11 NOTE — Progress Notes (Signed)
 Subjective  Patient ID: Autumn Gordon is a 50 y.o. female.  Patient is a 50 year old female who comes to clinic today with c/o low back pain, neck pain and migraine headache.  She states she use to get frequent migraines but now only gets them occasionally.  She denies injury to low back or neck.  She also denies UTI symptoms.  She has not taken anything OTC for her symptoms.   History provided by:  Patient Language interpreter used: No    Review of Systems  Constitutional: Negative.  Negative for fever.  HENT: Negative.    Eyes: Negative.   Respiratory: Negative.    Cardiovascular: Negative.   Gastrointestinal: Negative.   Endocrine: Negative.   Genitourinary:  Positive for hematuria.  Musculoskeletal:  Positive for back pain, myalgias and neck pain.  Allergic/Immunologic: Negative.   Neurological:  Positive for headaches.  Hematological: Negative.   Psychiatric/Behavioral: Negative.     Patient History   Allergies: Allergies  Allergen Reactions  . Shellfish Allergy Anaphylaxis  . Shellfish-Derived Products Anaphylaxis  . Tramadol Hives     History reviewed. No pertinent past medical history. History reviewed. No pertinent surgical history. Social History   Socioeconomic History  . Marital status: Divorced    Spouse name: Not on file  . Number of children: Not on file  . Years of education: Not on file  . Highest education level: Not on file  Occupational History  . Not on file  Tobacco Use  . Smoking status: Never  . Smokeless tobacco: Never  Substance and Sexual Activity  . Alcohol use: Not on file  . Drug use: Not on file  . Sexual activity: Not on file  Other Topics Concern  . Not on file  Social History Narrative  . Not on file   History reviewed. No pertinent family history. No current outpatient medications on file prior to visit.   No current facility-administered medications on file prior to visit.     Objective   Vitals:    08/11/21 1312  BP: 126/85  BP Location: Right arm  Pulse: 69  Resp: 18  Temp: 36.7 C (98.1 F)  TempSrc: Oral  SpO2: 98%  Weight: 88 kg  Height: 5' 4         No results found.  Physical Exam Vitals and nursing note reviewed.  Constitutional:      General: She is not in acute distress.    Appearance: Normal appearance. She is not ill-appearing.  HENT:     Nose: No congestion or rhinorrhea.  Eyes:     Extraocular Movements: Extraocular movements intact.     Conjunctiva/sclera: Conjunctivae normal.     Pupils: Pupils are equal, round, and reactive to light.  Cardiovascular:     Rate and Rhythm: Normal rate.     Pulses: Normal pulses.  Pulmonary:     Effort: Pulmonary effort is normal.  Abdominal:     Tenderness: There is no right CVA tenderness or left CVA tenderness.  Musculoskeletal:        General: Tenderness present. No swelling or signs of injury.  Skin:    General: Skin is warm and dry.  Neurological:     General: No focal deficit present.     Mental Status: She is alert and oriented to person, place, and time.  Psychiatric:        Mood and Affect: Mood normal.        Behavior: Behavior normal.  Results for orders placed or performed in visit on 08/11/21  POCT urinalysis dipstick manually resulted  Component Result   Color, UA Yellow   Clarity, UA Clear   Glucose, UA Negative   Bilirubin, UA 1+ (A)   Ketones, UA Trace (A)   Spec Grav, UA >=1.030 (A)   Blood, UA Trace (A)   pH, UA 6.0   Protein, UA Trace (A)   Urobilinogen, UA Negative   Leukocytes, UA Negative   Nitrite, UA Negative     Procedures MDM:     1 Acute complicated illness or injury     Unique ordered tests: One     Review of any test results: One     Assessment requiring historian other than patient: No     Independent visualization of image, tracing, or test: No     Discussion of management with another provider: No       Assessment/Plan  Diagnoses and all orders  for this visit: Lumbago -     triamcinolone acetonide (Kenalog-40) injection 40 mg -     predniSONE  (Deltasone ) 20 MG tablet; Take 2 tablets (40 mg total) by mouth 1 (one) time each day for 5 days. -     cyclobenzaprine  (Flexeril ) 5 MG tablet; Take 1 tablet (5 mg total) by mouth every night for 14 days. Hematuria -     Urine culture -     nitrofurantoin , macrocrystal-monohydrate, (Macrobid ) 100 MG capsule; Take 1 capsule (100 mg total) by mouth in the morning and 1 capsule (100 mg total) in the evening. Do all this for 5 days. Migraine Neck pain -     triamcinolone acetonide (Kenalog-40) injection 40 mg -     predniSONE  (Deltasone ) 20 MG tablet; Take 2 tablets (40 mg total) by mouth 1 (one) time each day for 5 days. -     cyclobenzaprine  (Flexeril ) 5 MG tablet; Take 1 tablet (5 mg total) by mouth every night for 14 days. Other orders -     POCT urinalysis dipstick manually resulted -Start Prednisone  tablets tomorrow morning with breakfast -No Ibuprofen  or Naproxen  (Advil  or Aleve ) while taking Prednisone  -Ice to area of migraine headache -flexeril  at bedtime for back and neck pain -Vicks Vapor Rub to low back and neck   Disposition Status: Home  Progress note signed by Montie Rao, NP on 08/11/21 at  1:33 PM

## 2022-07-10 NOTE — Progress Notes (Addendum)
 Subjective  Patient is a 51 y.o. female with history of migraines here c/w headache x 1.5 weeks.  Currently 10/10, located behind eyes and R side of head.  She notes pain at base of neck as well.  She reports n/v x several times today and yesterday.  She had negative COVID test at home today.  Not worse when laying down.  She is having difficulty falling asleep due to the headache.  She took 880 mg aleve  this morning around 5 hours ago.  Review of Systems  Constitutional:  Negative for chills, fatigue and fever.  HENT:  Negative for congestion, ear discharge, rhinorrhea and sore throat.   Eyes:  Negative for photophobia, discharge, redness and visual disturbance.  Respiratory:  Negative for cough, chest tightness, shortness of breath and wheezing.   Gastrointestinal:  Positive for nausea and vomiting. Negative for abdominal pain and diarrhea.  Musculoskeletal:  Positive for neck pain. Negative for arthralgias and myalgias.  Skin:  Negative for rash.  Allergic/Immunologic: Negative for environmental allergies and immunocompromised state.  Neurological:  Positive for headaches. Negative for dizziness, weakness and light-headedness.  Hematological:  Negative for adenopathy.  Psychiatric/Behavioral:  Negative for confusion and sleep disturbance.        Current Problem List: Patient Active Problem List  Diagnosis  . History of cerebrovascular accident  . Hyperlipidemia  . Migraine  . Anemia  . Bone infarct of distal femur, right (CMS/HCC)    Current Medications on file: Current Outpatient Medications on File Prior to Visit  Medication Sig Dispense Refill  . amLODIPine (Norvasc) 2.5 MG tablet Take 1 tablet every day by oral route.    SABRA buPROPion SR (Wellbutrin SR) 150 MG 12 hr tablet Take 1 tablet twice a day by oral route for 30 days.    . [EXPIRED] cyclobenzaprine  (Flexeril ) 5 MG tablet Take 1 tablet (5 mg total) by mouth every night for 14 days. 14 tablet 0   No current  facility-administered medications on file prior to visit.    Past Medical History: History reviewed. No pertinent past medical history.   Past Surgical History: History reviewed. No pertinent surgical history.  Social History: Social History   Socioeconomic History  . Marital status: Divorced    Spouse name: Not on file  . Number of children: Not on file  . Years of education: Not on file  . Highest education level: Not on file  Occupational History  . Not on file  Tobacco Use  . Smoking status: Every Day    Types: Cigarettes, Pipe  . Smokeless tobacco: Current  Substance and Sexual Activity  . Alcohol use: Defer  . Drug use: Not on file  . Sexual activity: Not on file  Other Topics Concern  . Not on file  Social History Narrative  . Not on file    Allergies: Allergies  Allergen Reactions  . Shellfish Allergy Anaphylaxis  . Shellfish-Derived Products Anaphylaxis  . Tramadol Hives    Objective  Visit Vitals BP 134/83 (BP Location: Left arm)  Pulse 74  Temp 36.7 C (98.1 F) (Oral)  Resp 17  Ht 5' 4  Wt 78.9 kg  LMP 06/24/2022 (Exact Date)  SpO2 100%  Breastfeeding No  BMI 29.87 kg/m  OB Status Having periods  Smoking Status Every Day  BSA 1.89 m     Physical Exam Vitals and nursing note reviewed.  Constitutional:      General: She is not in acute distress.    Appearance: Normal appearance.  She is not ill-appearing.  HENT:     Head: Normocephalic and atraumatic.     Nose: Nose normal.     Mouth/Throat:     Mouth: Mucous membranes are moist.  Eyes:     General: No scleral icterus.    Extraocular Movements: Extraocular movements intact.     Conjunctiva/sclera: Conjunctivae normal.     Pupils: Pupils are equal, round, and reactive to light.  Neck:     Meningeal: Brudzinski's sign and Kernig's sign absent.  Pulmonary:     Effort: Pulmonary effort is normal. No respiratory distress.  Musculoskeletal:        General: Normal range of motion.      Cervical back: Normal range of motion. No rigidity, spasms or tenderness. No spinous process tenderness or muscular tenderness. Normal range of motion.  Lymphadenopathy:     Cervical: No cervical adenopathy.  Skin:    General: Skin is warm.     Capillary Refill: Capillary refill takes less than 2 seconds.  Neurological:     General: No focal deficit present.     Mental Status: She is alert and oriented to person, place, and time.     GCS: GCS eye subscore is 4. GCS verbal subscore is 5. GCS motor subscore is 6.     Cranial Nerves: Cranial nerves 2-12 are intact.     Motor: No weakness.     Gait: Gait normal.     Deep Tendon Reflexes:     Reflex Scores:      Patellar reflexes are 2+ on the right side and 2+ on the left side. Psychiatric:        Mood and Affect: Mood normal.        Behavior: Behavior normal.      Results: No results found for this visit on 07/10/22.     Assessment  Autumn Gordon was seen today for headache. Diagnoses and all orders for this visit: Intractable migraine without aura and without status migrainosus (Primary) -     promethazine  (Phenergan ) injection 25 mg -     Discontinue: butalbital-acetaminophen -caffeine 50-325-40 MG tablet; Take 1 tablet by mouth every 6 (six) hours if needed for headaches. -     butalbital-acetaminophen -caffeine 50-325-40 MG tablet; Take 1 tablet by mouth every 6 (six) hours if needed for headaches.   ED precautions provided No signs of bacterial meningitis on exam Drink small volumes of clear liquids such as Pedialyte, Gatorade, water, beef/chicken/vegetable broth. Advance diet as tolerated. Discussed proper use of aleve  - cannot take more than 2 pills every 12 hours.       MDM:     1+ Chronic illness with exacerbation, progression or side effects of treatment     Risk:: Moderate

## 2022-08-26 ENCOUNTER — Encounter (HOSPITAL_COMMUNITY): Payer: Self-pay

## 2022-08-26 ENCOUNTER — Other Ambulatory Visit: Payer: Self-pay

## 2022-08-26 ENCOUNTER — Emergency Department (HOSPITAL_COMMUNITY)
Admission: EM | Admit: 2022-08-26 | Discharge: 2022-08-27 | Disposition: A | Discharge status: Home / Self Care | Payer: Managed Care, Other (non HMO) | Attending: Emergency Medicine | Admitting: Emergency Medicine

## 2022-08-26 DIAGNOSIS — N750 Cyst of Bartholin's gland: Secondary | ICD-10-CM | POA: Insufficient documentation

## 2022-08-26 DIAGNOSIS — N3000 Acute cystitis without hematuria: Secondary | ICD-10-CM | POA: Diagnosis not present

## 2022-08-26 LAB — URINALYSIS, ROUTINE W REFLEX MICROSCOPIC
Bilirubin Urine: NEGATIVE
Glucose, UA: NEGATIVE mg/dL
Ketones, ur: NEGATIVE mg/dL
Nitrite: POSITIVE — AB
Protein, ur: NEGATIVE mg/dL
Specific Gravity, Urine: 1.027 (ref 1.005–1.030)
pH: 5 (ref 5.0–8.0)

## 2022-08-26 MED ORDER — OXYCODONE-ACETAMINOPHEN 5-325 MG PO TABS
1.0000 | ORAL_TABLET | Freq: Once | ORAL | Status: AC
  Administered 2022-08-26: 1 via ORAL
  Filled 2022-08-26: qty 1

## 2022-08-26 NOTE — ED Provider Triage Note (Signed)
Emergency Medicine Provider Triage Evaluation Note  Autumn Gordon , a 51 y.o. female  was evaluated in triage.  Pt complains of porcelain cyst, notices on her left labia about a week ago, states is gotten larger and more painful states she has had some drainage, she states she is experienced UTI-like symptoms but these have since resolved no fevers chills, not immunocompromise states she has had this in the past.  Does not see an OB/GYN.Marland Kitchen  Review of Systems  Positive: Cyst, drainage Negative: Fever, chills  Physical Exam  BP (!) 168/93 (BP Location: Right Arm)   Pulse 74   Temp 98 F (36.7 C) (Oral)   Resp 16   Ht '5\' 4"'$  (1.626 m)   Wt 77.6 kg   SpO2 100%   BMI 29.35 kg/m  Gen:   Awake, no distress   Resp:  Normal effort  MSK:   Moves extremities without difficulty  Other:    Medical Decision Making  Medically screening exam initiated at 1:02 PM.  Appropriate orders placed.  Autumn Gordon was informed that the remainder of the evaluation will be completed by another provider, this initial triage assessment does not replace that evaluation, and the importance of remaining in the ED until their evaluation is complete.  Labs been ordered will need further work-up.   Marcello Fennel, PA-C 08/26/22 1303

## 2022-08-26 NOTE — ED Triage Notes (Signed)
Pt presents with the c/o bartholin cyst that is painful.

## 2022-08-27 DIAGNOSIS — N750 Cyst of Bartholin's gland: Secondary | ICD-10-CM | POA: Diagnosis not present

## 2022-08-27 MED ORDER — LIDOCAINE-EPINEPHRINE (PF) 2 %-1:200000 IJ SOLN
20.0000 mL | Freq: Once | INTRAMUSCULAR | Status: AC
  Administered 2022-08-27: 20 mL
  Filled 2022-08-27: qty 20

## 2022-08-27 MED ORDER — KETOROLAC TROMETHAMINE 30 MG/ML IJ SOLN
30.0000 mg | Freq: Once | INTRAMUSCULAR | Status: AC
  Administered 2022-08-27: 30 mg via INTRAVENOUS
  Filled 2022-08-27: qty 1

## 2022-08-27 MED ORDER — HYDROMORPHONE HCL 1 MG/ML IJ SOLN
1.0000 mg | Freq: Once | INTRAMUSCULAR | Status: AC
  Administered 2022-08-27: 1 mg via INTRAVENOUS
  Filled 2022-08-27: qty 1

## 2022-08-27 MED ORDER — OXYCODONE-ACETAMINOPHEN 5-325 MG PO TABS: 2.0000 | ORAL_TABLET | ORAL | 0 refills | Status: DC | PRN

## 2022-08-27 MED ORDER — SODIUM CHLORIDE 0.9 % IV SOLN
2.0000 g | Freq: Once | INTRAVENOUS | Status: AC
  Administered 2022-08-27: 2 g via INTRAVENOUS
  Filled 2022-08-27: qty 20

## 2022-08-27 MED ORDER — CEPHALEXIN 500 MG PO CAPS: 500.0000 mg | ORAL_CAPSULE | Freq: Four times a day (QID) | ORAL | 0 refills | Status: DC

## 2022-08-27 MED ORDER — OXYCODONE-ACETAMINOPHEN 5-325 MG PO TABS
2.0000 | ORAL_TABLET | Freq: Once | ORAL | Status: AC
  Administered 2022-08-27: 2 via ORAL
  Filled 2022-08-27: qty 2

## 2022-08-27 NOTE — ED Provider Notes (Signed)
[facility] EMERGENCY DEPARTMENT Provider Note   CSN: [id number] Arrival date & time: 08/26/22  1122     History  Chief Complaint  Patient presents with   Cyst    Autumn Gordon is a 51 y.o. female.  H/o bartholin gland cysts, now thinks she has one on the left of her labia. Been worsening for a week. Pain with movement. Pain with urination as well. Concerned for possible concurrent UTI. No fevers. Feels the swelling is worsening and spreading towards her rectum.         Home Medications Prior to Admission medications   Medication Sig Start Date End Date Taking? Authorizing Provider  cephALEXin (KEFLEX) 500 MG capsule Take 1 capsule (500 mg total) by mouth 4 (four) times daily. 08/27/22  Yes Mylik Pro, Corene Cornea, MD  oxyCODONE-acetaminophen (PERCOCET) 5-325 MG tablet Take 2 tablets by mouth every 4 (four) hours as needed. 08/27/22  Yes Galen Malkowski, Corene Cornea, MD  diclofenac (VOLTAREN) 75 MG EC tablet Take 1 tablet (75 mg total) by mouth 2 (two) times daily. 05/12/21   Fransico Meadow, PA-C  ibuprofen (ADVIL) 200 MG tablet Take 200 mg by mouth every 4 (four) hours as needed for mild pain.    [provider]  nicotine (NICODERM CQ - DOSED IN MG/24 HOURS) 14 mg/24hr patch Place 14 mg onto the skin daily.    [provider]      Allergies    Shellfish allergy and Tramadol    Review of Systems   Review of Systems  Physical Exam Updated Vital Signs BP (!) 148/95 (BP Location: Right Arm)   Pulse 74   Temp 98.6 F (37 C) (Oral)   Resp 18   Ht '5\' 4"'$  (1.626 m)   Wt 77.6 kg   LMP 08/08/2022   SpO2 96%   BMI 29.35 kg/m  Physical Exam Vitals and nursing note reviewed.  Constitutional:      Appearance: She is well-developed.  HENT:     Head: Normocephalic and atraumatic.  Eyes:     Pupils: Pupils are equal, round, and reactive to light.  Cardiovascular:     Rate and Rhythm: Normal rate and regular rhythm.  Pulmonary:     Effort: No  respiratory distress.     Breath sounds: No stridor.  Abdominal:     General: There is no distension.  Genitourinary:    Comments: Chaperoned for initial exam by tech Tonia Ghent.  Chaperoned for full pelvic exam and procedure by nurse Altha Harm Musculoskeletal:        General: No swelling or tenderness. Normal range of motion.     Cervical back: Normal range of motion.  Skin:    General: Skin is warm and dry.  Neurological:     General: No focal deficit present.     Mental Status: She is alert.     ED Results / Procedures / Treatments   Labs (all labs ordered are listed, but only abnormal results are displayed) Labs Reviewed  URINALYSIS, ROUTINE W REFLEX MICROSCOPIC - Abnormal; Notable for the following components:      Result Value   Color, Urine AMBER (*)    APPearance CLOUDY (*)    Hgb urine dipstick SMALL (*)    Nitrite POSITIVE (*)    Leukocytes,Ua MODERATE (*)    Bacteria, UA MANY (*)    All other components within normal limits  URINE CULTURE    EKG None  Radiology No results found.  Procedures .Marland KitchenIncision and Drainage  Date/Time: 08/27/2022 11:33 PM  Performed by: Merrily Pew, MD Authorized by: Merrily Pew, MD   Consent:    Consent obtained:  Verbal   Consent given by:  Patient   Risks discussed:  Bleeding, damage to other organs, infection, incomplete drainage and pain   Alternatives discussed:  No treatment Universal protocol:    Procedure explained and questions answered to patient or proxy's satisfaction: yes     Relevant documents present and verified: yes     Patient identity confirmed:  Verbally with patient Location:    Type:  Bartholin cyst   Size:  7x4 cm   Location:  Anogenital   Anogenital location:  Bartholin's gland Pre-procedure details:    Skin preparation:  Antiseptic wash Sedation:    Sedation type:  None Anesthesia:    Anesthesia method:  Local infiltration   Local anesthetic:  Lidocaine 2% WITH epi Procedure type:     Complexity:  Simple Procedure details:    Ultrasound guidance: no     Needle aspiration: no     Incision types:  Stab incision   Wound management:  Irrigated with saline   Drainage:  Bloody and purulent   Drainage amount:  Copious   Wound treatment:  Drain placed   Packing materials:  Word catheter Post-procedure details:    Procedure completion:  Tolerated well, no immediate complications   Medications Ordered in ED Medications  oxyCODONE-acetaminophen (PERCOCET/ROXICET) 5-325 MG per tablet 1 tablet (1 tablet Oral Given 08/26/22 1305)  HYDROmorphone (DILAUDID) injection 1 mg (1 mg Intravenous Given 08/27/22 0037)  ketorolac (TORADOL) 30 MG/ML injection 30 mg (30 mg Intravenous Given 08/27/22 0631)  cefTRIAXone (ROCEPHIN) 2 g in sodium chloride 0.9 % 100 mL IVPB (0 g Intravenous Stopped 08/27/22 0739)  lidocaine-EPINEPHrine (XYLOCAINE W/EPI) 2 %-1:200000 (PF) injection 20 mL (20 mLs Infiltration Given 08/27/22 0631)  oxyCODONE-acetaminophen (PERCOCET/ROXICET) 5-325 MG per tablet 2 tablet (2 tablets Oral Given 08/27/22 0488)    ED Course/ Medical Decision Making/ A&P                           Medical Decision Making Amount and/or Complexity of Data Reviewed Labs: ordered.  Risk Prescription drug management.   Bartholin gland abscess. I&D at bedside with copious expressed purulent somewhat bloody material with almost immediate improvement in symptoms. Abx started for that and the UTI.   Final Clinical Impression(s) / ED Diagnoses Final diagnoses:  Bartholin gland cyst  Acute cystitis without hematuria    Rx / DC Orders ED Discharge Orders          Ordered    cephALEXin (KEFLEX) 500 MG capsule  4 times daily        08/27/22 0725    oxyCODONE-acetaminophen (PERCOCET) 5-325 MG tablet  Every 4 hours PRN        08/27/22 0725              Irena Gaydos, Corene Cornea, MD 08/27/22 2335

## 2023-07-14 ENCOUNTER — Emergency Department (HOSPITAL_COMMUNITY)
Admission: EM | Admit: 2023-07-14 | Discharge: 2023-07-15 | Disposition: A | Discharge status: Home / Self Care | Payer: Managed Care, Other (non HMO) | Attending: Emergency Medicine | Admitting: Emergency Medicine

## 2023-07-14 ENCOUNTER — Other Ambulatory Visit: Payer: Self-pay

## 2023-07-14 ENCOUNTER — Encounter (HOSPITAL_COMMUNITY): Payer: Self-pay | Admitting: *Deleted

## 2023-07-14 DIAGNOSIS — N751 Abscess of Bartholin's gland: Secondary | ICD-10-CM | POA: Diagnosis present

## 2023-07-14 LAB — COMPREHENSIVE METABOLIC PANEL
ALT: 21 U/L (ref 0–44)
AST: 23 U/L (ref 15–41)
Albumin: 3.6 g/dL (ref 3.5–5.0)
Alkaline Phosphatase: 59 U/L (ref 38–126)
Anion gap: 12 (ref 5–15)
BUN: 9 mg/dL (ref 6–20)
CO2: 21 mmol/L — ABNORMAL LOW (ref 22–32)
Calcium: 8.9 mg/dL (ref 8.9–10.3)
Chloride: 103 mmol/L (ref 98–111)
Creatinine, Ser: 0.74 mg/dL (ref 0.44–1.00)
GFR, Estimated: >60 mL/min (ref >60)
Glucose, Bld: 85 mg/dL (ref 70–99)
Potassium: 3.6 mmol/L (ref 3.5–5.1)
Sodium: 136 mmol/L (ref 135–145)
Total Bilirubin: 0.3 mg/dL (ref 0.3–1.2)
Total Protein: 6.8 g/dL (ref 6.5–8.1)

## 2023-07-14 NOTE — ED Notes (Signed)
Pt reports cysts are a reoccurring issue and has been seen at [facility] for the same

## 2023-07-14 NOTE — ED Triage Notes (Signed)
The pt has has a cyst on her buttocks I has been there 3-4 days low grade temp  lmp aug 15

## 2023-07-15 LAB — CBC WITH DIFFERENTIAL/PLATELET
Abs Immature Granulocytes: 0.01 10*3/uL (ref 0.00–0.07)
Basophils Absolute: 0 10*3/uL (ref 0.0–0.1)
Basophils Relative: 0 %
Eosinophils Absolute: 0.3 10*3/uL (ref 0.0–0.5)
Eosinophils Relative: 5 %
HCT: 37.5 % (ref 36.0–46.0)
Hemoglobin: 12.4 g/dL (ref 12.0–15.0)
Immature Granulocytes: 0 %
Lymphocytes Relative: 45 %
Lymphs Abs: 3.3 10*3/uL (ref 0.7–4.0)
MCH: 28.7 pg (ref 26.0–34.0)
MCHC: 33.1 g/dL (ref 30.0–36.0)
MCV: 86.8 fL (ref 80.0–100.0)
Monocytes Absolute: 0.5 10*3/uL (ref 0.1–1.0)
Monocytes Relative: 7 %
Neutro Abs: 3 10*3/uL (ref 1.7–7.7)
Neutrophils Relative %: 43 %
Platelets: 325 10*3/uL (ref 150–400)
RBC: 4.32 MIL/uL (ref 3.87–5.11)
RDW: 14.5 % (ref 11.5–15.5)
WBC: 7.1 10*3/uL (ref 4.0–10.5)
nRBC: 0 % (ref 0.0–0.2)

## 2023-07-15 MED ORDER — OXYCODONE-ACETAMINOPHEN 5-325 MG PO TABS
1.0000 | ORAL_TABLET | Freq: Once | ORAL | Status: AC
  Administered 2023-07-15: 1 via ORAL
  Filled 2023-07-15: qty 1

## 2023-07-15 MED ORDER — LACTATED RINGERS IV BOLUS
1000.0000 mL | Freq: Once | INTRAVENOUS | Status: AC
  Administered 2023-07-15: 1000 mL via INTRAVENOUS

## 2023-07-15 MED ORDER — LIDOCAINE-EPINEPHRINE (PF) 2 %-1:200000 IJ SOLN
20.0000 mL | Freq: Once | INTRAMUSCULAR | Status: AC
  Administered 2023-07-15: 20 mL
  Filled 2023-07-15: qty 20

## 2023-07-15 MED ORDER — HYDROMORPHONE HCL 1 MG/ML IJ SOLN
1.0000 mg | Freq: Once | INTRAMUSCULAR | Status: AC
  Administered 2023-07-15: 1 mg via INTRAVENOUS
  Filled 2023-07-15: qty 1

## 2023-07-15 MED ORDER — CEPHALEXIN 500 MG PO CAPS: 500.0000 mg | ORAL_CAPSULE | Freq: Four times a day (QID) | ORAL | 0 refills | Status: DC

## 2023-07-15 MED ORDER — KETOROLAC TROMETHAMINE 30 MG/ML IJ SOLN
30.0000 mg | Freq: Once | INTRAMUSCULAR | Status: AC
  Administered 2023-07-15: 30 mg via INTRAVENOUS
  Filled 2023-07-15: qty 1

## 2023-07-15 MED ORDER — SODIUM CHLORIDE 0.9 % IV SOLN
2.0000 g | Freq: Once | INTRAVENOUS | Status: AC
  Administered 2023-07-15: 2 g via INTRAVENOUS
  Filled 2023-07-15: qty 20

## 2023-07-15 MED ORDER — OXYCODONE-ACETAMINOPHEN 5-325 MG PO TABS: 2.0000 | ORAL_TABLET | ORAL | 0 refills | Status: DC | PRN

## 2023-07-15 NOTE — ED Provider Notes (Signed)
[place] EMERGENCY DEPARTMENT AT [facility] Provider Note   CSN: [id number] Arrival date & time: 07/14/23  2038     History  Chief Complaint  Patient presents with   Cysto Stent Removal    Autumn Gordon is a 52 y.o. female.  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 Hasn't seen gynecology yet. No fever. Has been going on for a couple days this time.        Home Medications Prior to Admission medications   Medication Sig Start Date End Date Taking? Authorizing Provider  cephALEXin (KEFLEX) 500 MG capsule Take 1 capsule (500 mg total) by mouth 4 (four) times daily. 07/15/23  Yes Katrinia Straker, Barbara Cower, MD  oxyCODONE-acetaminophen (PERCOCET) 5-325 MG tablet Take 2 tablets by mouth every 4 (four) hours as needed. 07/15/23  Yes Anastasiya Gowin, Barbara Cower, MD  diclofenac (VOLTAREN) 75 MG EC tablet Take 1 tablet (75 mg total) by mouth 2 (two) times daily. 05/12/21   Elson Areas, PA-C  ibuprofen (ADVIL) 200 MG tablet Take 200 mg by mouth every 4 (four) hours as needed for mild pain.    [provider]  nicotine (NICODERM CQ - DOSED IN MG/24 HOURS) 14 mg/24hr patch Place 14 mg onto the skin daily.    [provider]      Allergies    Shellfish allergy and Tramadol    Review of Systems   Review of Systems  Physical Exam Updated Vital Signs BP (!) 143/77   Pulse 62   Temp 98.7 F (37.1 C) (Oral)   Resp 20   Ht 5\' 4"  (1.626 m)   Wt 77.1 kg   LMP 06/27/2023   SpO2 97%   BMI 29.18 kg/m  Physical Exam Vitals and nursing note reviewed.  Constitutional:      Appearance: She is well-developed.  HENT:     Head: Normocephalic and atraumatic.  Cardiovascular:     Rate and Rhythm: Normal rate and regular rhythm.  Pulmonary:     Effort: No respiratory distress.     Breath sounds: No stridor.  Abdominal:     General: There is no distension.  Genitourinary:    Comments: Chaperoned by nurse Morrie Sheldon for exam. Musculoskeletal:     Cervical back:  Normal range of motion.  Neurological:     General: No focal deficit present.     Mental Status: She is alert.     ED Results / Procedures / Treatments   Labs (all labs ordered are listed, but only abnormal results are displayed) Labs Reviewed  COMPREHENSIVE METABOLIC PANEL - Abnormal; Notable for the following components:      Result Value   CO2 21 (*)    All other components within normal limits  CBC WITH DIFFERENTIAL/PLATELET    EKG None  Radiology No results found.  Procedures .Marland KitchenIncision and Drainage  Date/Time: 07/15/2023 7:05 AM  Performed by: Marily Memos, MD Authorized by: Marily Memos, MD   Consent:    Consent obtained:  Verbal   Consent given by:  Patient   Risks, benefits, and alternatives were discussed: yes     Risks discussed:  Bleeding, incomplete drainage, pain, infection and damage to other organs   Alternatives discussed:  No treatment Universal protocol:    Procedure explained and questions answered to patient or proxy's satisfaction: yes     Imaging studies available: no     Patient identity confirmed:  Verbally with patient and arm band Location:    Type:  Bartholin cyst   Size:  2x2   Location:  Anogenital   Anogenital location:  Bartholin's gland Pre-procedure details:    Skin preparation:  Povidone-iodine Sedation:    Sedation type:  None Anesthesia:    Anesthesia method:  Local infiltration   Local anesthetic:  Lidocaine 2% WITH epi Procedure type:    Complexity:  Simple Procedure details:    Incision types:  Single straight   Wound management:  Probed and deloculated and irrigated with saline   Drainage:  Bloody, purulent and serous   Drainage amount:  Moderate   Packing materials:  Word catheter Post-procedure details:    Procedure completion:  Tolerated well, no immediate complications Comments:     Chaperoned by nurse Marchelle Folks for procedure.     Medications Ordered in ED Medications  cefTRIAXone (ROCEPHIN) 2 g in sodium  chloride 0.9 % 100 mL IVPB (0 g Intravenous Stopped 07/15/23 0129)  HYDROmorphone (DILAUDID) injection 1 mg (1 mg Intravenous Given 07/15/23 0050)  lactated ringers bolus 1,000 mL (0 mLs Intravenous Stopped 07/15/23 0331)  ketorolac (TORADOL) 30 MG/ML injection 30 mg (30 mg Intravenous Given 07/15/23 0051)  lidocaine-EPINEPHrine (XYLOCAINE W/EPI) 2 %-1:200000 (PF) injection 20 mL (20 mLs Infiltration Given by Other 07/15/23 0224)  HYDROmorphone (DILAUDID) injection 1 mg (1 mg Intravenous Given 07/15/23 0228)  oxyCODONE-acetaminophen (PERCOCET/ROXICET) 5-325 MG per tablet 1 tablet (1 tablet Oral Given 07/15/23 1610)    ED Course/ Medical Decision Making/ A&P                                 Medical Decision Making Amount and/or Complexity of Data Reviewed Labs: ordered.  Risk Prescription drug management.   I took care of patient for last bartholins abscess and this one was much less purulent but had more blood/vaginal secretions in it than pus. The purulent material was probably 1-2 oz whereas the blood/secretions were probably around 4-5 oz. Word catheter placed. Abx started. Recommended gyn follow up for marsupialization if indicated but at least to get the word catheter out.    Final Clinical Impression(s) / ED Diagnoses Final diagnoses:  Bartholin's gland abscess    Rx / DC Orders ED Discharge Orders          Ordered    cephALEXin (KEFLEX) 500 MG capsule  4 times daily        07/15/23 0316    oxyCODONE-acetaminophen (PERCOCET) 5-325 MG tablet  Every 4 hours PRN        07/15/23 0316              Clorine Swing, Barbara Cower, MD 07/15/23 [phone]

## 2023-07-15 NOTE — ED Notes (Signed)
Pt verbalized understanding of discharge instructions. IV removed. Pt called daughter to pick her up. Pt ambulated from ed with steady gait.

## 2023-07-17 ENCOUNTER — Encounter (HOSPITAL_BASED_OUTPATIENT_CLINIC_OR_DEPARTMENT_OTHER): Payer: Self-pay

## 2023-07-17 ENCOUNTER — Emergency Department (HOSPITAL_BASED_OUTPATIENT_CLINIC_OR_DEPARTMENT_OTHER)
Admission: EM | Admit: 2023-07-17 | Discharge: 2023-07-18 | Disposition: A | Discharge status: Home / Self Care | Payer: Managed Care, Other (non HMO) | Attending: Emergency Medicine | Admitting: Emergency Medicine

## 2023-07-17 ENCOUNTER — Other Ambulatory Visit: Payer: Self-pay

## 2023-07-17 DIAGNOSIS — R112 Nausea with vomiting, unspecified: Secondary | ICD-10-CM | POA: Diagnosis present

## 2023-07-17 DIAGNOSIS — F17210 Nicotine dependence, cigarettes, uncomplicated: Secondary | ICD-10-CM | POA: Insufficient documentation

## 2023-07-17 DIAGNOSIS — Z96611 Presence of right artificial shoulder joint: Secondary | ICD-10-CM | POA: Insufficient documentation

## 2023-07-17 DIAGNOSIS — R509 Fever, unspecified: Secondary | ICD-10-CM | POA: Diagnosis not present

## 2023-07-17 DIAGNOSIS — R102 Pelvic and perineal pain: Secondary | ICD-10-CM

## 2023-07-17 DIAGNOSIS — Z1152 Encounter for screening for COVID-19: Secondary | ICD-10-CM | POA: Insufficient documentation

## 2023-07-17 DIAGNOSIS — I10 Essential (primary) hypertension: Secondary | ICD-10-CM | POA: Diagnosis not present

## 2023-07-17 DIAGNOSIS — Z8542 Personal history of malignant neoplasm of other parts of uterus: Secondary | ICD-10-CM | POA: Diagnosis not present

## 2023-07-17 DIAGNOSIS — R5381 Other malaise: Secondary | ICD-10-CM | POA: Insufficient documentation

## 2023-07-17 DIAGNOSIS — J449 Chronic obstructive pulmonary disease, unspecified: Secondary | ICD-10-CM | POA: Insufficient documentation

## 2023-07-17 DIAGNOSIS — J45909 Unspecified asthma, uncomplicated: Secondary | ICD-10-CM | POA: Diagnosis not present

## 2023-07-17 LAB — SARS CORONAVIRUS 2 BY RT PCR: SARS Coronavirus 2 by RT PCR: NEGATIVE

## 2023-07-17 MED ORDER — ONDANSETRON HCL 4 MG/2ML IJ SOLN
4.0000 mg | Freq: Once | INTRAMUSCULAR | Status: AC
  Administered 2023-07-18: 4 mg via INTRAVENOUS
  Filled 2023-07-17: qty 2

## 2023-07-17 MED ORDER — SODIUM CHLORIDE 0.9 % IV BOLUS
1000.0000 mL | Freq: Once | INTRAVENOUS | Status: AC
  Administered 2023-07-18: 1000 mL via INTRAVENOUS

## 2023-07-17 NOTE — ED Triage Notes (Addendum)
Pt to ED c/o fever, nausea, vomiting since Saturday here for bartholins abscess, ward catheter placed. Pt reports compliant with abx regimen. C/O pain at abscess. This RN notified ED-PA Jari Favre of pt,instructed for  no blood lab orders at this time.

## 2023-07-18 LAB — COMPREHENSIVE METABOLIC PANEL
ALT: 14 U/L (ref 0–44)
AST: 15 U/L (ref 15–41)
Albumin: 3.8 g/dL (ref 3.5–5.0)
Alkaline Phosphatase: 51 U/L (ref 38–126)
Anion gap: 6 (ref 5–15)
BUN: 11 mg/dL (ref 6–20)
CO2: 28 mmol/L (ref 22–32)
Calcium: 8.6 mg/dL — ABNORMAL LOW (ref 8.9–10.3)
Chloride: 105 mmol/L (ref 98–111)
Creatinine, Ser: 0.61 mg/dL (ref 0.44–1.00)
GFR, Estimated: >60 mL/min (ref >60)
Glucose, Bld: 99 mg/dL (ref 70–99)
Potassium: 3.2 mmol/L — ABNORMAL LOW (ref 3.5–5.1)
Sodium: 139 mmol/L (ref 135–145)
Total Bilirubin: 0.2 mg/dL — ABNORMAL LOW (ref 0.3–1.2)
Total Protein: 5.9 g/dL — ABNORMAL LOW (ref 6.5–8.1)

## 2023-07-18 LAB — URINALYSIS, ROUTINE W REFLEX MICROSCOPIC
Bacteria, UA: NONE SEEN
Bilirubin Urine: NEGATIVE
Glucose, UA: NEGATIVE mg/dL
Ketones, ur: NEGATIVE mg/dL
Nitrite: NEGATIVE
Protein, ur: NEGATIVE mg/dL
Specific Gravity, Urine: 1.029 (ref 1.005–1.030)
pH: 6 (ref 5.0–8.0)

## 2023-07-18 LAB — CBC
HCT: 35.7 % — ABNORMAL LOW (ref 36.0–46.0)
Hemoglobin: 12 g/dL (ref 12.0–15.0)
MCH: 28.4 pg (ref 26.0–34.0)
MCHC: 33.6 g/dL (ref 30.0–36.0)
MCV: 84.4 fL (ref 80.0–100.0)
Platelets: 318 10*3/uL (ref 150–400)
RBC: 4.23 MIL/uL (ref 3.87–5.11)
RDW: 14.3 % (ref 11.5–15.5)
WBC: 6 10*3/uL (ref 4.0–10.5)
nRBC: 0 % (ref 0.0–0.2)

## 2023-07-18 LAB — LIPASE, BLOOD: Lipase: 17 U/L (ref 11–51)

## 2023-07-18 LAB — PREGNANCY, URINE: Preg Test, Ur: NEGATIVE

## 2023-07-18 MED ORDER — MORPHINE SULFATE (PF) 4 MG/ML IV SOLN
4.0000 mg | Freq: Once | INTRAVENOUS | Status: AC
  Administered 2023-07-18: 4 mg via INTRAVENOUS
  Filled 2023-07-18: qty 1

## 2023-07-18 MED ORDER — DOXYCYCLINE HYCLATE 100 MG PO CAPS: 100.0000 mg | ORAL_CAPSULE | Freq: Two times a day (BID) | ORAL | 0 refills | Status: AC

## 2023-07-18 NOTE — Discharge Instructions (Signed)
You were evaluated in the Emergency Department and after careful evaluation, we did not find any emergent condition requiring admission or further testing in the hospital.  Your exam/testing today is overall reassuring.  Stop taking the Keflex and start taking the doxycycline as directed.  Please return to the Emergency Department if you experience any worsening of your condition.   Thank you for allowing [place] to be a part of your care.
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
## 2023-09-19 ENCOUNTER — Emergency Department (HOSPITAL_BASED_OUTPATIENT_CLINIC_OR_DEPARTMENT_OTHER): Payer: Managed Care, Other (non HMO) | Admitting: Radiology

## 2023-09-19 ENCOUNTER — Emergency Department (HOSPITAL_COMMUNITY)
Admission: EM | Admit: 2023-09-19 | Discharge: 2023-09-19 | Discharge status: Against Medical Advice | Payer: Managed Care, Other (non HMO) | Attending: Emergency Medicine | Admitting: Emergency Medicine

## 2023-09-19 ENCOUNTER — Encounter (HOSPITAL_BASED_OUTPATIENT_CLINIC_OR_DEPARTMENT_OTHER): Payer: Self-pay | Admitting: Emergency Medicine

## 2023-09-19 ENCOUNTER — Other Ambulatory Visit: Payer: Self-pay

## 2023-09-19 ENCOUNTER — Emergency Department (HOSPITAL_BASED_OUTPATIENT_CLINIC_OR_DEPARTMENT_OTHER): Payer: Managed Care, Other (non HMO)

## 2023-09-19 ENCOUNTER — Emergency Department (HOSPITAL_BASED_OUTPATIENT_CLINIC_OR_DEPARTMENT_OTHER)
Admission: EM | Admit: 2023-09-19 | Discharge: 2023-09-20 | Disposition: A | Discharge status: Home / Self Care | Payer: Managed Care, Other (non HMO) | Source: Home / Self Care | Attending: Emergency Medicine | Admitting: Emergency Medicine

## 2023-09-19 DIAGNOSIS — J069 Acute upper respiratory infection, unspecified: Secondary | ICD-10-CM | POA: Insufficient documentation

## 2023-09-19 DIAGNOSIS — Z1152 Encounter for screening for COVID-19: Secondary | ICD-10-CM | POA: Insufficient documentation

## 2023-09-19 DIAGNOSIS — I10 Essential (primary) hypertension: Secondary | ICD-10-CM | POA: Insufficient documentation

## 2023-09-19 DIAGNOSIS — Z5321 Procedure and treatment not carried out due to patient leaving prior to being seen by health care provider: Secondary | ICD-10-CM | POA: Diagnosis not present

## 2023-09-19 DIAGNOSIS — Z79899 Other long term (current) drug therapy: Secondary | ICD-10-CM | POA: Insufficient documentation

## 2023-09-19 DIAGNOSIS — G43809 Other migraine, not intractable, without status migrainosus: Secondary | ICD-10-CM

## 2023-09-19 LAB — BASIC METABOLIC PANEL
Anion gap: 9 (ref 5–15)
BUN: 16 mg/dL (ref 6–20)
CO2: 21 mmol/L — ABNORMAL LOW (ref 22–32)
Calcium: 9.8 mg/dL (ref 8.9–10.3)
Chloride: 106 mmol/L (ref 98–111)
Creatinine, Ser: 0.7 mg/dL (ref 0.44–1.00)
GFR, Estimated: >60 mL/min (ref >60)
Glucose, Bld: 79 mg/dL (ref 70–99)
Potassium: 4 mmol/L (ref 3.5–5.1)
Sodium: 136 mmol/L (ref 135–145)

## 2023-09-19 LAB — CBC WITH DIFFERENTIAL/PLATELET
Abs Immature Granulocytes: 0.01 10*3/uL (ref 0.00–0.07)
Basophils Absolute: 0 10*3/uL (ref 0.0–0.1)
Basophils Relative: 1 %
Eosinophils Absolute: 0.3 10*3/uL (ref 0.0–0.5)
Eosinophils Relative: 5 %
HCT: 40.9 % (ref 36.0–46.0)
Hemoglobin: 13.8 g/dL (ref 12.0–15.0)
Immature Granulocytes: 0 %
Lymphocytes Relative: 49 %
Lymphs Abs: 2.8 10*3/uL (ref 0.7–4.0)
MCH: 28.6 pg (ref 26.0–34.0)
MCHC: 33.7 g/dL (ref 30.0–36.0)
MCV: 84.7 fL (ref 80.0–100.0)
Monocytes Absolute: 0.4 10*3/uL (ref 0.1–1.0)
Monocytes Relative: 7 %
Neutro Abs: 2.2 10*3/uL (ref 1.7–7.7)
Neutrophils Relative %: 38 %
Platelets: 340 10*3/uL (ref 150–400)
RBC: 4.83 MIL/uL (ref 3.87–5.11)
RDW: 14.3 % (ref 11.5–15.5)
WBC: 5.7 10*3/uL (ref 4.0–10.5)
nRBC: 0 % (ref 0.0–0.2)

## 2023-09-19 LAB — RESP PANEL BY RT-PCR (RSV, FLU A&B, COVID)  RVPGX2
Influenza A by PCR: NEGATIVE
Influenza B by PCR: NEGATIVE
Resp Syncytial Virus by PCR: NEGATIVE
SARS Coronavirus 2 by RT PCR: NEGATIVE

## 2023-09-19 LAB — TROPONIN I (HIGH SENSITIVITY): Troponin I (High Sensitivity): 3 ng/L (ref <18)

## 2023-09-19 MED ORDER — KETOROLAC TROMETHAMINE 60 MG/2ML IM SOLN
30.0000 mg | Freq: Once | INTRAMUSCULAR | Status: AC
  Administered 2023-09-19: 30 mg via INTRAMUSCULAR
  Filled 2023-09-19: qty 2

## 2023-09-19 MED ORDER — METOCLOPRAMIDE HCL 5 MG/ML IJ SOLN
10.0000 mg | Freq: Once | INTRAMUSCULAR | Status: AC
  Administered 2023-09-19: 10 mg via INTRAMUSCULAR
  Filled 2023-09-19: qty 2

## 2023-09-19 MED ORDER — DIPHENHYDRAMINE HCL 25 MG PO CAPS
25.0000 mg | ORAL_CAPSULE | Freq: Once | ORAL | Status: AC
  Administered 2023-09-19: 25 mg via ORAL
  Filled 2023-09-19: qty 1

## 2023-09-19 NOTE — ED Notes (Signed)
LWBS 

## 2023-09-19 NOTE — Discharge Instructions (Signed)
Follow up with your doctor for recheck this week. Continue taking your Lisinopril daily.

## 2023-09-19 NOTE — ED Triage Notes (Signed)
PMH HTN on meds, reports BP has been elevated with headache over the past few days. HTN over the past month but has not gone done on BP meds over the past few days. Heaviness in the chest that has been consistent for approx 2 days. Cough since Thursday, chills with no fever. Pt also reports a few episodes of vomiting but denies nausea at present.

## 2023-09-19 NOTE — ED Notes (Signed)
Patient transported to CT 

## 2023-09-19 NOTE — ED Provider Notes (Signed)
[place] EMERGENCY DEPARTMENT AT [facility] Provider Note   CSN: [id number] Arrival date & time: 09/19/23  1811     History  Chief Complaint  Patient presents with   Hypertension   Chest Pain    Autumn Gordon is a 52 y.o. female.  Patient to ED with concern for her blood pressure. She was started on Lisinopril one month ago and noticed improvement in her readings, but that over the last several days, it has increased again citing 170's systolic. She also has a headache typical of her migraine headaches, also for several days. She also reports a cough and chest pressure without fever or significant congestion. No nausea. No diarrhea. No sick contacts.   The history is provided by the patient. No language interpreter was used.  Hypertension Associated symptoms include chest pain.  Chest Pain      Home Medications Prior to Admission medications   Medication Sig Start Date End Date Taking? Authorizing Provider  lisinopril (ZESTRIL) 10 MG tablet Take 10 mg by mouth daily. 09/18/23  Yes [provider]  diclofenac (VOLTAREN) 75 MG EC tablet Take 1 tablet (75 mg total) by mouth 2 (two) times daily. 05/12/21   Elson Areas, PA-C  ibuprofen (ADVIL) 200 MG tablet Take 200 mg by mouth every 4 (four) hours as needed for mild pain.    [provider]  nicotine (NICODERM CQ - DOSED IN MG/24 HOURS) 14 mg/24hr patch Place 14 mg onto the skin daily.    [provider]  oxyCODONE-acetaminophen (PERCOCET) 5-325 MG tablet Take 2 tablets by mouth every 4 (four) hours as needed. 07/15/23   Mesner, Barbara Cower, MD      Allergies    Shellfish allergy and Tramadol    Review of Systems   Review of Systems  Cardiovascular:  Positive for chest pain.    Physical Exam Updated Vital Signs BP (!) 156/97   Pulse 67   Temp 98.4 F (36.9 C) (Oral)   Resp 14   LMP 09/18/2023 (Exact Date)   SpO2 99%  Physical Exam Vitals and nursing note reviewed.   Constitutional:      Appearance: She is well-developed.  HENT:     Head: Normocephalic and atraumatic.     Mouth/Throat:     Mouth: Mucous membranes are moist.  Eyes:     Conjunctiva/sclera: Conjunctivae normal.  Cardiovascular:     Rate and Rhythm: Normal rate and regular rhythm.     Heart sounds: No murmur heard. Pulmonary:     Effort: Pulmonary effort is normal.     Breath sounds: No wheezing, rhonchi or rales.  Abdominal:     Palpations: Abdomen is soft.  Musculoskeletal:        General: Normal range of motion.     Cervical back: Normal range of motion and neck supple.     Right lower leg: No edema.     Left lower leg: No edema.  Skin:    General: Skin is warm and dry.  Neurological:     General: No focal deficit present.     Mental Status: She is alert and oriented to person, place, and time.     ED Results / Procedures / Treatments   Labs (all labs ordered are listed, but only abnormal results are displayed) Labs Reviewed  BASIC METABOLIC PANEL - Abnormal; Notable for the following components:      Result Value   CO2 21 (*)    All other components within normal  limits  RESP PANEL BY RT-PCR (RSV, FLU A&B, COVID)  [id number]  CBC WITH DIFFERENTIAL/PLATELET  CBC WITH DIFFERENTIAL/PLATELET  TROPONIN I (HIGH SENSITIVITY)  TROPONIN I (HIGH SENSITIVITY)   Results for orders placed or performed during the hospital encounter of 09/19/23  Resp panel by RT-PCR (RSV, Flu A&B, Covid) Anterior Nasal Swab   Specimen: Anterior Nasal Swab  Result Value Ref Range   SARS Coronavirus 2 by RT PCR NEGATIVE NEGATIVE   Influenza A by PCR NEGATIVE NEGATIVE   Influenza B by PCR NEGATIVE NEGATIVE   Resp Syncytial Virus by PCR NEGATIVE NEGATIVE  Basic metabolic panel  Result Value Ref Range   Sodium 136 135 - 145 mmol/L   Potassium 4.0 3.5 - 5.1 mmol/L   Chloride 106 98 - 111 mmol/L   CO2 21 (L) 22 - 32 mmol/L   Glucose, Bld 79 70 - 99 mg/dL   BUN 16 6 - 20 mg/dL   Creatinine, Ser  1.61 0.44 - 1.00 mg/dL   Calcium 9.8 8.9 - 09.6 mg/dL   GFR, Estimated >04 >54 mL/min   Anion gap 9 5 - 15  CBC with Differential/Platelet  Result Value Ref Range   WBC 5.7 4.0 - 10.5 K/uL   RBC 4.83 3.87 - 5.11 MIL/uL   Hemoglobin 13.8 12.0 - 15.0 g/dL   HCT 09.8 11.9 - 14.7 %   MCV 84.7 80.0 - 100.0 fL   MCH 28.6 26.0 - 34.0 pg   MCHC 33.7 30.0 - 36.0 g/dL   RDW 82.9 56.2 - 13.0 %   Platelets 340 150 - 400 K/uL   nRBC 0.0 0.0 - 0.2 %   Neutrophils Relative % 38 %   Neutro Abs 2.2 1.7 - 7.7 K/uL   Lymphocytes Relative 49 %   Lymphs Abs 2.8 0.7 - 4.0 K/uL   Monocytes Relative 7 %   Monocytes Absolute 0.4 0.1 - 1.0 K/uL   Eosinophils Relative 5 %   Eosinophils Absolute 0.3 0.0 - 0.5 K/uL   Basophils Relative 1 %   Basophils Absolute 0.0 0.0 - 0.1 K/uL   Immature Granulocytes 0 %   Abs Immature Granulocytes 0.01 0.00 - 0.07 K/uL  Troponin I (High Sensitivity)  Result Value Ref Range   Troponin I (High Sensitivity) 3 <18 ng/L   Troponin  EKG EKG Interpretation Date/Time:  Tuesday September 19 2023 18:26:48 EST Ventricular Rate:  66 PR Interval:  163 QRS Duration:  84 QT Interval:  394 QTC Calculation: 413 R Axis:   48  Text Interpretation: Sinus rhythm Probable left atrial enlargement Confirmed by Vonita Moss [phone]) on 09/19/2023 6:28:45 PM  Radiology CT Head Wo Contrast  Result Date: 09/19/2023 CLINICAL DATA:  Headache with elevated blood pressure. EXAM: CT HEAD WITHOUT CONTRAST TECHNIQUE: Contiguous axial images were obtained from the base of the skull through the vertex without intravenous contrast. RADIATION DOSE REDUCTION: This exam was performed according to the departmental dose-optimization program which includes automated exposure control, adjustment of the mA and/or kV according to patient size and/or use of iterative reconstruction technique. COMPARISON:  December 29, 2016 FINDINGS: Brain: No evidence of acute infarction, hemorrhage, hydrocephalus,  extra-axial collection or mass lesion/mass effect. A small chronic right caudate head infarct is again seen. Vascular: No hyperdense vessel or unexpected calcification. Skull: Normal. Negative for fracture or focal lesion. Sinuses/Orbits: No acute finding. Other: None. IMPRESSION: 1. No acute intracranial abnormality. 2. Small chronic right caudate head infarct. Electronically Signed   By: Demetrius Revel.D.  On: 09/19/2023 23:12   DG Chest 2 View  Result Date: 09/19/2023 CLINICAL DATA:  Cough, hypertension EXAM: CHEST - 2 VIEW COMPARISON:  10/27/2015 FINDINGS: The heart size and mediastinal contours are within normal limits. Both lungs are clear. The visualized skeletal structures are unremarkable. No pneumothorax IMPRESSION: Negative. Electronically Signed   By: Charlett Nose M.D.   On: 09/19/2023 21:10    Procedures Procedures    Medications Ordered in ED Medications  ketorolac (TORADOL) injection 30 mg (30 mg Intramuscular Given 09/19/23 2118)  metoCLOPramide (REGLAN) injection 10 mg (10 mg Intramuscular Given 09/19/23 2119)  diphenhydrAMINE (BENADRYL) capsule 25 mg (25 mg Oral Given 09/19/23 2121)    ED Course/ Medical Decision Making/ A&P                                 Medical Decision Making This patient presents to the ED for concern of multiple complaints including headache, elevated blood pressure, chest pressure, cough, these involve an extensive number of treatment options, and are complaints that carry a high risk of complications and morbidity.  The differential diagnosis includes: Headache: Migraine, bleed, CVA, aneurysm Elevated BP: uncontrolled HTN Chest pressure: ACS, PNA, PTX Cough: viral vs bacterial URI, COVID   Co morbidities that complicate the patient evaluation  HTN, recently started on medications; history of migraine    Lab Tests:  I Ordered, and personally interpreted labs.  The pertinent results include:  Troponin negative; no WBC elevation; normal  electrolytes    Imaging Studies ordered:  I ordered imaging studies including CXR  I independently visualized and interpreted imaging which showed No infiltrates or consolidations. I agree with the radiologist interpretation I also ordered HEAD CT:   Cardiac Monitoring: / EKG:  The patient was maintained on a cardiac monitor.  I personally viewed and interpreted the cardiac monitored which showed an underlying rhythm of: NSR     Problem List / ED Course / Critical interventions / Medication management  Headache: head CT pending. Migraine Cocktail provided - will reassess Chest pressure and cough: normal EKG, neg troponin - doubt ACS. Viral panel negative - does not exclude other viral URI process Blood pressure: Max systolic pressure in ED 174 - not critical. Patient reassured.   I ordered medication including Reglan, Benadryl, Toradol  for headache pain  Reevaluation of the patient after these medicines showed that the patient stayed the same. Head CT ordered for further evaluation.  I have reviewed the patients home medicines and have made adjustments as needed   Social Determinants of Health:  She denies established PCP   Test / Admission - Considered:  Headache is improved - negative head CT - likely migraine with history of same CXR negative, viral panel negative - no PNA - cough like viral process Blood pressure - not critically elevated - continue current medication    Amount and/or Complexity of Data Reviewed Labs: ordered. Radiology: ordered.  Risk Prescription drug management.           Final Clinical Impression(s) / ED Diagnoses Final diagnoses:  Other migraine without status migrainosus, not intractable  Viral upper respiratory tract infection    Rx / DC Orders ED Discharge Orders     None         Danne Harbor 09/19/23 2324    Rondel Baton, MD 09/20/23 1017

## 2023-09-19 NOTE — ED Notes (Signed)
Back from CT

## 2023-12-24 ENCOUNTER — Encounter (HOSPITAL_BASED_OUTPATIENT_CLINIC_OR_DEPARTMENT_OTHER): Payer: Self-pay | Admitting: Emergency Medicine

## 2023-12-24 ENCOUNTER — Emergency Department (HOSPITAL_BASED_OUTPATIENT_CLINIC_OR_DEPARTMENT_OTHER): Payer: Managed Care, Other (non HMO)

## 2023-12-24 ENCOUNTER — Emergency Department (HOSPITAL_BASED_OUTPATIENT_CLINIC_OR_DEPARTMENT_OTHER)
Admission: EM | Admit: 2023-12-24 | Discharge: 2023-12-24 | Disposition: A | Discharge status: Home / Self Care | Payer: Managed Care, Other (non HMO) | Attending: Emergency Medicine | Admitting: Emergency Medicine

## 2023-12-24 ENCOUNTER — Other Ambulatory Visit: Payer: Self-pay

## 2023-12-24 ENCOUNTER — Emergency Department (HOSPITAL_COMMUNITY): Payer: Managed Care, Other (non HMO)

## 2023-12-24 DIAGNOSIS — R200 Anesthesia of skin: Secondary | ICD-10-CM | POA: Diagnosis not present

## 2023-12-24 DIAGNOSIS — Z79899 Other long term (current) drug therapy: Secondary | ICD-10-CM | POA: Insufficient documentation

## 2023-12-24 DIAGNOSIS — I10 Essential (primary) hypertension: Secondary | ICD-10-CM | POA: Diagnosis not present

## 2023-12-24 DIAGNOSIS — M545 Low back pain, unspecified: Secondary | ICD-10-CM | POA: Diagnosis present

## 2023-12-24 DIAGNOSIS — R531 Weakness: Secondary | ICD-10-CM | POA: Insufficient documentation

## 2023-12-24 DIAGNOSIS — M5441 Lumbago with sciatica, right side: Secondary | ICD-10-CM

## 2023-12-24 LAB — COMPREHENSIVE METABOLIC PANEL
ALT: 14 U/L (ref 0–44)
AST: 18 U/L (ref 15–41)
Albumin: 4.1 g/dL (ref 3.5–5.0)
Alkaline Phosphatase: 58 U/L (ref 38–126)
Anion gap: 6 (ref 5–15)
BUN: 7 mg/dL (ref 6–20)
CO2: 27 mmol/L (ref 22–32)
Calcium: 9.5 mg/dL (ref 8.9–10.3)
Chloride: 104 mmol/L (ref 98–111)
Creatinine, Ser: 0.61 mg/dL (ref 0.44–1.00)
GFR, Estimated: >60 mL/min (ref >60)
Glucose, Bld: 80 mg/dL (ref 70–99)
Potassium: 4.3 mmol/L (ref 3.5–5.1)
Sodium: 137 mmol/L (ref 135–145)
Total Bilirubin: 0.4 mg/dL (ref 0.0–1.2)
Total Protein: 7.2 g/dL (ref 6.5–8.1)

## 2023-12-24 LAB — CBC WITH DIFFERENTIAL/PLATELET
Abs Immature Granulocytes: 0.02 10*3/uL (ref 0.00–0.07)
Basophils Absolute: 0 10*3/uL (ref 0.0–0.1)
Basophils Relative: 0 %
Eosinophils Absolute: 0.3 10*3/uL (ref 0.0–0.5)
Eosinophils Relative: 4 %
HCT: 41.5 % (ref 36.0–46.0)
Hemoglobin: 13.8 g/dL (ref 12.0–15.0)
Immature Granulocytes: 0 %
Lymphocytes Relative: 37 %
Lymphs Abs: 2.5 10*3/uL (ref 0.7–4.0)
MCH: 27.9 pg (ref 26.0–34.0)
MCHC: 33.3 g/dL (ref 30.0–36.0)
MCV: 83.8 fL (ref 80.0–100.0)
Monocytes Absolute: 0.5 10*3/uL (ref 0.1–1.0)
Monocytes Relative: 7 %
Neutro Abs: 3.6 10*3/uL (ref 1.7–7.7)
Neutrophils Relative %: 52 %
Platelets: 371 10*3/uL (ref 150–400)
RBC: 4.95 MIL/uL (ref 3.87–5.11)
RDW: 14.1 % (ref 11.5–15.5)
WBC: 6.9 10*3/uL (ref 4.0–10.5)
nRBC: 0 % (ref 0.0–0.2)

## 2023-12-24 LAB — C-REACTIVE PROTEIN: CRP: <0.5 mg/dL (ref <1.0)

## 2023-12-24 LAB — URINALYSIS, ROUTINE W REFLEX MICROSCOPIC
Bacteria, UA: NONE SEEN
Bilirubin Urine: NEGATIVE
Glucose, UA: NEGATIVE mg/dL
Ketones, ur: NEGATIVE mg/dL
Nitrite: NEGATIVE
Specific Gravity, Urine: 1.03 (ref 1.005–1.030)
pH: 5.5 (ref 5.0–8.0)

## 2023-12-24 LAB — LACTIC ACID, PLASMA: Lactic Acid, Venous: 0.5 mmol/L (ref 0.5–1.9)

## 2023-12-24 LAB — SEDIMENTATION RATE: Sed Rate: 5 mm/h (ref 0–22)

## 2023-12-24 LAB — PREGNANCY, URINE: Preg Test, Ur: NEGATIVE

## 2023-12-24 MED ORDER — OXYCODONE-ACETAMINOPHEN 5-325 MG PO TABS: 1.0000 | ORAL_TABLET | Freq: Four times a day (QID) | ORAL | 0 refills | Status: DC | PRN

## 2023-12-24 MED ORDER — PREDNISONE 10 MG PO TABS: ORAL_TABLET | ORAL | 0 refills | Status: DC

## 2023-12-24 MED ORDER — HYDROMORPHONE HCL 1 MG/ML IJ SOLN
0.5000 mg | Freq: Once | INTRAMUSCULAR | Status: AC
Start: 2023-12-24 — End: 2023-12-24
  Administered 2023-12-24: 0.5 mg via INTRAVENOUS
  Filled 2023-12-24: qty 1

## 2023-12-24 MED ORDER — METHOCARBAMOL 500 MG PO TABS
500.0000 mg | ORAL_TABLET | Freq: Once | ORAL | Status: AC
  Administered 2023-12-24: 500 mg via ORAL
  Filled 2023-12-24: qty 1

## 2023-12-24 MED ORDER — NITROFURANTOIN MONOHYD MACRO 100 MG PO CAPS: 100.0000 mg | ORAL_CAPSULE | Freq: Two times a day (BID) | ORAL | 0 refills | Status: DC

## 2023-12-24 MED ORDER — METHOCARBAMOL 750 MG PO TABS: 750.0000 mg | ORAL_TABLET | Freq: Four times a day (QID) | ORAL | 0 refills | Status: DC

## 2023-12-24 MED ORDER — OXYCODONE-ACETAMINOPHEN 5-325 MG PO TABS
1.0000 | ORAL_TABLET | Freq: Once | ORAL | Status: AC
Start: 2023-12-24 — End: 2023-12-24
  Administered 2023-12-24: 1 via ORAL
  Filled 2023-12-24: qty 1

## 2023-12-24 MED ORDER — LORAZEPAM 2 MG/ML IJ SOLN
0.5000 mg | Freq: Once | INTRAMUSCULAR | Status: AC | PRN
  Administered 2023-12-24: 0.5 mg via INTRAVENOUS
  Filled 2023-12-24: qty 1

## 2023-12-24 MED ORDER — GADOBUTROL 1 MMOL/ML IV SOLN
8.0000 mL | Freq: Once | INTRAVENOUS | Status: AC | PRN
  Administered 2023-12-24: 8 mL via INTRAVENOUS

## 2023-12-24 MED ORDER — LIDOCAINE 5 % EX PTCH
1.0000 | MEDICATED_PATCH | CUTANEOUS | Status: DC
  Administered 2023-12-24: 1 via TRANSDERMAL
  Filled 2023-12-24 (×2): qty 1

## 2023-12-24 NOTE — ED Provider Notes (Signed)
  Physical Exam  BP 136/63 (BP Location: Right Arm)   Pulse 62   Temp 98 F (36.7 C) (Oral)   Resp 16   Ht 5' 4 (1.626 m)   Wt 81.6 kg   SpO2 96%   BMI 30.90 kg/m   Physical Exam  Procedures  Procedures  ED Course / MDM    Medical Decision Making Amount and/or Complexity of Data Reviewed Labs: ordered. Radiology: ordered.  Risk Prescription drug management.   Patient to [facility] ED in transfer from [facility] for MRI to evaluate back pain. CT lumbar spine ?discitis vs degenerative changes vs osteomyelitis at L4-L5   She is comfortable, well appearing, in NAD. VSS. Ativan  prn ordered prior to MRI. Will manage pain for now.   15:30 - MRI reviewed. The area in question at L4,L5 appears to be degenerative. She is felt appropriate for discharge home, prednisone  dosepack, pain control, rest. Will provide ortho follow up if pain persists.        Odell Balls, PA-C 12/24/23 1540    Bernard Drivers, MD 12/25/23 [phone]

## 2023-12-24 NOTE — ED Triage Notes (Signed)
 C/o mid to lower back pain that rads into both legs. No recent injures. Dx with UTI last week and finished antibiotics.

## 2023-12-24 NOTE — ED Triage Notes (Addendum)
 Transfer from [facility] ER for MRI. Felt a pop last night while lying bed in her back, since then 10/10 pain, bilateral numbness and tingling in legs.   Received percocet 1015, 0.5mg  dilaudid  at ~12p, 50mcg fentanyl  with [facility] at 1223 which improved to 9/10 [facility] VS: 60bpm, NSR, 95% RA, SBP 160

## 2023-12-24 NOTE — ED Provider Notes (Signed)
 [place] EMERGENCY DEPARTMENT AT [facility] Provider Note   CSN: [id number] Arrival date & time: 12/24/23  9177     History Chief Complaint  Patient presents with   Back Pain    Autumn Gordon is a 53 y.o. female with reported history of hypertension presents the emerged from today for evaluation of lower back pain since yesterday.  Patient reports she has not had any recent fall or heavy lifting.  She reports that she was resting when the pain started.  She does have pain with movement of her legs.  Reports decrease in sensation throughout both of her legs.  She was able to drive herself here and ambulate into the emergency department.  She denies any fever, personal history of cancer, fecal or urinary continence, trouble with urination, saddle anesthesia, or any leg swelling.  No recent travel or exogenous hormone use.  She reports that she felt that she had a urinary tract infection a few days ago and took 1 pill of Cipro that she had leftover for 5 days.  She does not feel like she has as much symptoms.  She does have a history of chronic back pain since an accident over 10 years ago.  She has tried naproxen  and Tylenol  with last dose being around 0400 today.  She is allergic to tramadol.  She is half pack per day smoker.   Back Pain Associated symptoms: numbness and weakness   Associated symptoms: no chest pain, no dysuria and no fever        Home Medications Prior to Admission medications   Medication Sig Start Date End Date Taking? Authorizing Provider  diclofenac  (VOLTAREN ) 75 MG EC tablet Take 1 tablet (75 mg total) by mouth 2 (two) times daily. 05/12/21   Sofia, Leslie K, PA-C  ibuprofen  (ADVIL ) 200 MG tablet Take 200 mg by mouth every 4 (four) hours as needed for mild pain.    [provider]  lisinopril  (ZESTRIL ) 10 MG tablet Take 10 mg by mouth daily. 09/18/23   [provider]  nicotine  (NICODERM CQ  - DOSED IN MG/24 HOURS) 14 mg/24hr patch  Place 14 mg onto the skin daily.    [provider]  oxyCODONE -acetaminophen  (PERCOCET) 5-325 MG tablet Take 2 tablets by mouth every 4 (four) hours as needed. 07/15/23   Mesner, Selinda, MD      Allergies    Shellfish allergy  and Tramadol    Review of Systems   Review of Systems  Constitutional:  Negative for chills and fever.  Respiratory:  Negative for shortness of breath.   Cardiovascular:  Negative for chest pain.  Gastrointestinal:  Negative for abdominal distention, constipation, diarrhea, nausea and vomiting.       Denies any fecal incontinence  Genitourinary:  Negative for dysuria and hematuria.       Denies any urinary incontinence or urinary retention.  Musculoskeletal:  Positive for back pain.       Denies any saddle anesthesia  Neurological:  Positive for weakness and numbness.    Physical Exam Updated Vital Signs BP (!) 141/95 (BP Location: Right Arm)   Pulse 76   Temp 98.5 F (36.9 C)   Resp 16   Ht 5' 4 (1.626 m)   Wt 81.6 kg   SpO2 98%   BMI 30.90 kg/m  Physical Exam Vitals and nursing note reviewed.  Constitutional:      Appearance: She is not toxic-appearing.     Comments: Uncomfortable but nontoxic-appearing  Eyes:  General: No scleral icterus. Cardiovascular:     Rate and Rhythm: Normal rate.     Pulses:          Dorsalis pedis pulses are 2+ on the right side and 2+ on the left side.       Posterior tibial pulses are 2+ on the right side and 2+ on the left side.  Pulmonary:     Effort: Pulmonary effort is normal. No respiratory distress.  Abdominal:     General: There is no distension.  Musculoskeletal:        General: Tenderness present.       Back:     Right lower leg: No edema.     Left lower leg: No edema.     Comments: Pain present at the focal area.  No step-offs or deformities.  Appears to be older tattoo present.  No other overlying skin changes noted.  No fluctuance induration.  No warmth or erythema.  Positive straight  leg raise on the left.  Strength assessment is limited secondary to pain with this.  She reports decreased sensation to light touch however is symmetric.  She has palpable DP and PT pulses.  I was able to witness her transfer to wheelchair to stretcher without issue.  Skin:    General: Skin is warm and dry.  Neurological:     Mental Status: She is alert.     ED Results / Procedures / Treatments   Labs (all labs ordered are listed, but only abnormal results are displayed) Labs Reviewed  URINALYSIS, ROUTINE W REFLEX MICROSCOPIC  PREGNANCY, URINE    EKG None  Radiology CT L-SPINE NO CHARGE Result Date: 12/24/2023 CLINICAL DATA:  53 year old female with abdomen and flank pain, mid to low back pain radiating down both legs. Symptom onset last night. Recently diagnosed with UTI. EXAM: CT LUMBAR SPINE WITHOUT CONTRAST TECHNIQUE: Technique: Multiplanar CT images of the lumbar spine were reconstructed from contemporary CT of the Abdomen and Pelvis. RADIATION DOSE REDUCTION: This exam was performed according to the departmental dose-optimization program which includes automated exposure control, adjustment of the mA and/or kV according to patient size and/or use of iterative reconstruction technique. CONTRAST:  None COMPARISON:  CT Abdomen and Pelvis reported separately. FINDINGS: Segmentation: Normal. Alignment: Straightening of lumbar lordosis. No significant scoliosis or spondylolisthesis. Vertebrae: Background bone mineralization is within normal limits. Degenerative appearing sclerosis of the L4 and L5 endplates, facets, L3-L4 facets. Mild lucent erosion also the L4 inferior endplate on sagittal image 38. No other acute or suspicious osseous finding. Visible sacrum and SI joints appear intact. Paraspinal and other soft tissues: Abdomen and pelvic viscera are detailed separately. Lumbar paraspinal soft tissues remarkable for subtle prevertebral soft tissue thickening and stranding at the L4 and L5  levels, series 2, images 84, 101. Posterior paraspinal soft tissues appear negative. Disc levels: Mild for age spine degeneration above L3-L4. L3-L4: Circumferential disc bulge with moderate ligament flavum hypertrophy and moderate to severe facet degeneration. Vacuum facet on the right. Bilateral degenerative appearing facet subchondral cysts. Mild multifactorial spinal stenosis suspected, at least moderate L3 foraminal stenosis which is worse on the left. L4-L5: Disc space loss. Circumferential disc bulge. Broad-based posterior component. Less severe facet and ligament flavum hypertrophy. Mild spinal stenosis suspected. Mild to moderate L4 foraminal stenosis. L5-S1: Disc space loss. Circumferential disc bulging with endplate spurring. Mild posterior element hypertrophy. No spinal stenosis, but left lateral recess disc osteophyte complex (left S1 nerve level series 2, image 104). Mild to  moderate L5 foraminal stenosis. IMPRESSION: 1. Indeterminate but suspicious CT appearance of the L4-L5 level with endplate irregularity and erosion, and subtle prevertebral soft tissue stranding. These findings could be degenerative but cannot exclude Discitis Osteomyelitis at that level. Lumbar MRI without and with contrast would evaluate with the highest sensitivity and specificity. 2. 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 3.  CT Abdomen and Pelvis reported separately. Electronically Signed   By: VEAR Hurst M.D.   On: 12/24/2023 10:49   CT Renal Stone Study Result Date: 12/24/2023 CLINICAL DATA:  53 year old female with abdomen and flank pain, mid to low back pain radiating down both legs. Symptom onset last night. Recently diagnosed with UTI. EXAM: CT ABDOMEN AND PELVIS WITHOUT CONTRAST TECHNIQUE: Multidetector CT imaging of the abdomen and pelvis was performed following the standard protocol without IV contrast. RADIATION DOSE REDUCTION: This exam was performed  according to the departmental dose-optimization program which includes automated exposure control, adjustment of the mA and/or kV according to patient size and/or use of iterative reconstruction technique. COMPARISON:  CT Abdomen and Pelvis 04/22/2021. Lumbar spine CT today reported separately. FINDINGS: Lower chest: Normal heart size. Negative lung bases. No pericardial or pleural effusion. Hepatobiliary: Negative noncontrast liver and gallbladder. Pancreas: Negative. Spleen: Negative. Adrenals/Urinary Tract: Normal adrenal glands. Chronic simple fluid density and benign appearing renal cysts are not significantly changed (no follow-up imaging recommended). No nephrolithiasis. No hydronephrosis. No evidence of renal inflammation. Decompressed ureters. Chronic pelvic phleboliths. Diminutive urinary bladder does appear mildly indistinct such as on coronal image 57. Stomach/Bowel: Decompressed large bowel from the splenic flexure distally. Occasional distal large bowel diverticula. No active inflammation. Mild retained stool in the transverse and right colon. Normal appendix, series 2, image 58. No dilated small bowel. Decompressed stomach and duodenum. No free air, free fluid, mesenteric inflammation identified. Vascular/Lymphatic: Normal caliber abdominal aorta. Vascular patency is not evaluated in the absence of IV contrast. No calcified atherosclerosis or lymphadenopathy identified. Reproductive: Globular, enlarged uterus with evidence of underlying fibroid disease (2.2 cm fibroid suspected on series 2, image 64. Uterine size only mildly increased since 2022. Noncontrast ovaries within normal limits. Other: No pelvis free fluid. Musculoskeletal: Lumbar spine detailed separately. No acute osseous abnormality identified. IMPRESSION: 1. No nephrolithiasis or obstructive uropathy. But indistinct appearance of the urinary bladder suggesting bladder inflammation such as due to UTI or Cystitis. 2. No other acute or  inflammatory process identified in the noncontrast abdomen or pelvis. Normal appendix. 3. Lumbar spine CT reported separately. Electronically Signed   By: VEAR Hurst M.D.   On: 12/24/2023 10:35    Procedures Procedures   Medications Ordered in ED Medications  lidocaine  (LIDODERM ) 5 % 1 patch (has no administration in time range)  oxyCODONE -acetaminophen  (PERCOCET/ROXICET) 5-325 MG per tablet 1 tablet (has no administration in time range)    ED Course/ Medical Decision Making/ A&P                                Medical Decision Making Amount and/or Complexity of Data Reviewed Labs: ordered. Radiology: ordered.  Risk Prescription drug management.   53 y.o. female presents to the ER for evaluation of back pain. Differential diagnosis includes but is not limited to Fracture (acute/chronic), muscle strain, cauda equina, spinal stenosis, DDD, metastatic cancer, vertebral osteomyelitis, kidney stone, pyelonephritis, AAA, pancreatitis, bowel obstruction, meningitis. Vital signs unremarkable. Physical exam as noted above.  Given the patient had decrease in station bilaterally, did order CT renal with add on lumbar.  CT renal . No nephrolithiasis or obstructive uropathy. But indistinct appearance of the urinary bladder suggesting bladder inflammation such as due to UTI or Cystitis. 2. No other acute or inflammatory process identified in the noncontrast abdomen or pelvis. Normal appendix. 3. Lumbar spine CT reported separately. Per radiologist's interpretation.    CT Lumbar 1. Indeterminate but suspicious CT appearance of the L4-L5 level with endplate irregularity and erosion, and subtle prevertebral soft tissue stranding. These findings could be degenerative but cannot exclude Discitis Osteomyelitis at that level. Lumbar MRI without and with contrast would evaluate with the highest sensitivity and specificity. 2. Other lumbar spine degeneration L3-L4 thru L5-S1 with mild spinal stenosis, bilateral  foraminal stenosis. 3.  CT Abdomen and Pelvis reported separately. Per radiologist's interpretation.    I independently reviewed and interpreted the patient's labs.  Urinalysis shows hazy urine with moderate hemoglobin present.  Trace protein but has moderate leukocytes.  21-50 red blood cells of 2150 white blood cells are seen.  No bacteria however.  There is significant amount squamous epithelial.  Patient reports she is in with provide another urine sample because she at the pain walking to the bathroom.  Urine culture sent.  Pregnancy negative  Given the questionable findings on the CT imaging, I did order ESR, CRP, lactic, blood cultures as well as CBC and CMP.  Patient will need transfer over to [facility] for access to MRI and continuation of care.  She will need to be treated for a UTI. Discussed this with the patient and she agrees.  Patient being transferred [facility].  Accepted by Dr. Neysa.  Portions of this report may have been transcribed using voice recognition software. Every effort was made to ensure accuracy; however, inadvertent computerized transcription errors may be present.    Final Clinical Impression(s) / ED Diagnoses Final diagnoses:  None    Rx / DC Orders ED Discharge Orders     None         Bernis Ernst, PA-C 12/24/23 1938    Ruthe Cornet, DO 12/27/23 1448

## 2023-12-24 NOTE — ED Notes (Signed)
Tequila with  cl called for transport 

## 2023-12-24 NOTE — Discharge Instructions (Addendum)
 As we discussed, your MRI study shows only degenerative changes in your back - no sign of infection or other concerning finding. Take the medications as prescribed. Rest the back. If your pain is no better in 4-5 days, call orthopedics - Dr. Beuford - to schedule an appointment for further evaluation.   Your urine shows a possible infection. I have prescribed a further 5 days of antibiotics, however, there is a urine culture pending and should be reviewed with your doctor this week to make sure your symptoms are being treated appropriately.

## 2023-12-25 LAB — URINE CULTURE: Culture: <10000 — AB

## 2023-12-27 MED FILL — Fentanyl Citrate Preservative Free (PF) Inj 100 MCG/2ML: INTRAMUSCULAR | Qty: 2 | Status: AC

## 2023-12-29 LAB — CULTURE, BLOOD (ROUTINE X 2)
Culture: NO GROWTH
Special Requests: ADEQUATE

## 2024-03-15 ENCOUNTER — Other Ambulatory Visit: Payer: Self-pay

## 2024-03-15 ENCOUNTER — Encounter (HOSPITAL_BASED_OUTPATIENT_CLINIC_OR_DEPARTMENT_OTHER): Payer: Self-pay | Admitting: Emergency Medicine

## 2024-03-15 ENCOUNTER — Emergency Department (HOSPITAL_BASED_OUTPATIENT_CLINIC_OR_DEPARTMENT_OTHER)
Admission: EM | Admit: 2024-03-15 | Discharge: 2024-03-15 | Disposition: A | Discharge status: Home / Self Care | Attending: Emergency Medicine | Admitting: Emergency Medicine

## 2024-03-15 DIAGNOSIS — N751 Abscess of Bartholin's gland: Secondary | ICD-10-CM | POA: Diagnosis present

## 2024-03-15 MED ORDER — KETOROLAC TROMETHAMINE 60 MG/2ML IM SOLN
60.0000 mg | Freq: Once | INTRAMUSCULAR | Status: AC
  Administered 2024-03-15: 60 mg via INTRAMUSCULAR
  Filled 2024-03-15: qty 2

## 2024-03-15 MED ORDER — DOXYCYCLINE HYCLATE 100 MG PO CAPS: 100.0000 mg | ORAL_CAPSULE | Freq: Two times a day (BID) | ORAL | 0 refills | Status: AC

## 2024-03-15 MED ORDER — LIDOCAINE HCL (PF) 1 % IJ SOLN
30.0000 mL | Freq: Once | INTRAMUSCULAR | Status: AC
  Administered 2024-03-15: 5 mL
  Filled 2024-03-15: qty 30

## 2024-03-15 NOTE — ED Triage Notes (Signed)
 Pt with bartholin's cyst x 1 week.  Hx of same.  Much worse today.

## 2024-03-15 NOTE — Discharge Instructions (Addendum)
 Start doxycycline  100 mg twice daily for 7 days.  Take 400 mg ibuprofen  every 4-6 hours as needed for pain.  Follow-up with OB/GYN next week for further management.  Please return to the emergency department if you experience return of your symptoms.

## 2024-03-15 NOTE — ED Provider Notes (Signed)
 [place] EMERGENCY DEPARTMENT AT [facility] Provider Note   CSN: [id number] Arrival date & time: 03/15/24  1837     History  Chief Complaint  Patient presents with   Abscess    Autumn Gordon is a 53 y.o. female.  52 year old female presenting with left-sided Bartholin's abscess.  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  Patient said area has been bothersome for several weeks now, however today the pain began to worsen and the area began to "grow".  She has not seen OB/GYN for this issue.  Denies fevers.    Abscess Associated symptoms: no fever        Home Medications Prior to Admission medications   Medication Sig Start Date End Date Taking? Authorizing Provider  diclofenac  (VOLTAREN ) 75 MG EC tablet Take 1 tablet (75 mg total) by mouth 2 (two) times daily. 05/12/21   Sofia, Leslie K, PA-C  ibuprofen  (ADVIL ) 200 MG tablet Take 200 mg by mouth every 4 (four) hours as needed for mild pain.    [provider]  lisinopril (ZESTRIL) 10 MG tablet Take 10 mg by mouth daily. 09/18/23   [provider]  methocarbamol  (ROBAXIN ) 750 MG tablet Take 1 tablet (750 mg total) by mouth 4 (four) times daily. 12/24/23   Mandy Second, PA-C  nicotine  (NICODERM CQ  - DOSED IN MG/24 HOURS) 14 mg/24hr patch Place 14 mg onto the skin daily.    [provider]  nitrofurantoin , macrocrystal-monohydrate, (MACROBID ) 100 MG capsule Take 1 capsule (100 mg total) by mouth 2 (two) times daily. 12/24/23   Mandy Second, PA-C  oxyCODONE -acetaminophen  (PERCOCET/ROXICET) 5-325 MG tablet Take 1 tablet by mouth every 6 (six) hours as needed for severe pain (pain score 7-10). 12/24/23   Mandy Second, PA-C  predniSONE  (DELTASONE ) 10 MG tablet Take 6 tablets on day 1 decreasing dose by one tablet per day until gone 12/24/23   Rexie Catena, PA-C      Allergies    Shellfish allergy and Tramadol    Review of Systems   Review  of Systems  Constitutional:  Negative for fever.    Physical Exam Updated Vital Signs BP (!) 145/101 (BP Location: Right Arm)   Pulse 79   Temp 98.6 F (37 C) (Oral)   Resp 16   SpO2 99%  Physical Exam Vitals and nursing note reviewed.  HENT:     Head: Normocephalic and atraumatic.  Eyes:     Extraocular Movements: Extraocular movements intact.  Cardiovascular:     Rate and Rhythm: Normal rate.  Pulmonary:     Effort: Pulmonary effort is normal.  Abdominal:     Palpations: Abdomen is soft.  Genitourinary:    Comments: GU exam performed with nurse Alex at the bedside as chaperone ~4cm fluctuant abscess at the 4 o'clock position of the left labia, no overlying cellulitis, no visualized drainage, area is significantly tender to palpation. Skin:    General: Skin is warm and dry.  Neurological:     Mental Status: She is alert.     ED Results / Procedures / Treatments   Labs (all labs ordered are listed, but only abnormal results are displayed) Labs Reviewed - No data to display  EKG None  Radiology No results found.  Procedures .Incision and Drainage  Date/Time: 03/15/2024 9:27 PM  Performed by: Kendrick Pax, PA-C Authorized by: Kendrick Pax, PA-C   Consent:    Consent obtained:  Verbal  Consent given by:  Patient   Risks, benefits, and alternatives were discussed: yes     Risks discussed:  Bleeding, incomplete drainage, pain and infection   Alternatives discussed:  No treatment Universal protocol:    Procedure explained and questions answered to patient or proxy's satisfaction: yes     Patient identity confirmed:  Verbally with patient Location:    Type:  Bartholin cyst   Size:  3cm   Location:  Anogenital   Anogenital location:  Bartholin's gland Pre-procedure details:    Skin preparation:  Povidone-iodine Sedation:    Sedation type:  None Anesthesia:    Anesthesia method:  Local infiltration   Local anesthetic:  Lidocaine  1% w/o epi Procedure  type:    Complexity:  Simple Procedure details:    Incision types:  Single straight   Wound management:  Irrigated with saline   Drainage:  Bloody and purulent   Drainage amount:  Copious   Wound treatment:  Wound left open   Packing materials:  None Post-procedure details:    Procedure completion:  Tolerated well, no immediate complications Comments:     Patient unable to tolerate insertion of packing material or Word catheter, wound left open for further drainage.      Medications Ordered in ED Medications  lidocaine  (PF) (XYLOCAINE ) 1 % injection 30 mL (has no administration in time range)  ketorolac  (TORADOL ) injection 60 mg (has no administration in time range)    ED Course/ Medical Decision Making/ A&P                                 Medical Decision Making This patient presents to the ED for concern of Bartholin's abscess, this involves an extensive number of treatment options, and is a complaint that carries with it a high risk of complications and morbidity.  The differential diagnosis includes Bartholin's cyst, other glandular cyst, Bartholin's abscess, lipoma, cellulitis.   Co morbidities that complicate the patient evaluation  Recurrent Bartholin's abscesses   Additional history obtained:  Additional history obtained from chart review External records from outside source obtained and reviewed including 2 most recent emergency department notes relating to prior Bartholin's abscess incision and drainages   Cardiac Monitoring: / EKG:  The patient was maintained on a cardiac monitor.  I personally viewed and interpreted the cardiac monitored which showed an underlying rhythm of: normal sinus rhythm   Problem List / ED Course / Critical interventions / Medication management   I ordered medication including Toradol  for pain Reevaluation of the patient after these medicines showed that the patient improved  Test / Admission - Considered:  Physical exam is  notable for abscess of the left labia at the 4 o'clock position, this is consistent with patient's prior Bartholin's abscesses that she has had in the past.  See above for procedure note.  Patient tolerated incision and drainage well, significant volume of purulent/bloody material was expressed from the abscess, the abscess was irrigated thoroughly using saline flushes, patient was unable to tolerate Word catheter or placement of packing material, therefore abscess was left open to allow for sufficient drainage.  Advised patient to call Monday to schedule appointment with OB/GYN for further management, patient understands that she is at high risk for recurrence of this given her history of multiple Bartholin's abscesses in the past.  Patient is agreeable with this plan.  Given the location of the abscess and risk for infection, patient  was started on 100 mg doxycycline  twice daily for 7 days.  Patient is appropriate for discharge at this time.  Return precautions discussed.    Risk Prescription drug management.           Final Clinical Impression(s) / ED Diagnoses Final diagnoses:  Bartholin's gland abscess    Rx / DC Orders ED Discharge Orders          Ordered    doxycycline  (VIBRAMYCIN ) 100 MG capsule  2 times daily        03/15/24 2123              Kendrick Pax, [place] 03/15/24 2357    Flonnie Humphrey, DO 03/16/24 0017

## 2024-06-25 ENCOUNTER — Ambulatory Visit: Payer: Self-pay

## 2024-06-25 NOTE — Telephone Encounter (Signed)
 FYI Only or Action Required?: FYI only for provider.  Patient was last seen New Patient  Called Nurse Triage reporting Shortness of Breath.  Symptoms began several days ago.  Interventions attempted: Other: Seeking sooner NP appointment.  Symptoms are: stable.  Triage Disposition: See PCP Within 2 Weeks  Patient/caregiver understands and will follow disposition?: Yes  **Patient can be scheduled a sooner NP appointment**       Copied from CRM [id number]. Topic: Clinical - Red Word Triage >> Jun 25, 2024  4:35 PM Celestine FALCON wrote: Red Word that prompted transfer to Nurse Triage: Pt went to UC on 06/24/2024 due to having a headache, dizziness, brain fog, difficulty breathing, coughing, chest tightness, and sore throat described as razor blades when attempting to breathe. Pt is still experiencing these sx today.   Pt had a breathing treatment at the UC that helped some. Pt is scheduled with Dr. Jude at [facility] on 09/18/2024 as a new pt, but she is requesting to be seen much sooner due to her sx. >> Jun 25, 2024  4:39 PM Celestine F wrote: Pt was exposed to black mold, I forgot to mention this in the original information sorry!  Reason for Disposition  Requesting regular office appointment  Answer Assessment - Initial Assessment Questions 1. REASON FOR CALL: What is the main reason for your call? or How can I best help you?   Seeking new patient appointment. Will send back to specialists for scheduling sooner NP appt. Symptoms were discussed with patient, she will seek care if symptoms worsen, persist.  Protocols used: Information Only Call - No Triage-A-AH

## 2024-07-11 ENCOUNTER — Ambulatory Visit (INDEPENDENT_AMBULATORY_CARE_PROVIDER_SITE_OTHER)

## 2024-07-11 VITALS — BP 134/79 | HR 77 | Temp 98.0°F | Ht 64.0 in | Wt 189.6 lb

## 2024-07-11 DIAGNOSIS — R0602 Shortness of breath: Secondary | ICD-10-CM

## 2024-07-11 DIAGNOSIS — K219 Gastro-esophageal reflux disease without esophagitis: Secondary | ICD-10-CM | POA: Diagnosis not present

## 2024-07-11 DIAGNOSIS — R053 Chronic cough: Secondary | ICD-10-CM

## 2024-07-11 DIAGNOSIS — J029 Acute pharyngitis, unspecified: Secondary | ICD-10-CM | POA: Diagnosis not present

## 2024-07-11 DIAGNOSIS — R0982 Postnasal drip: Secondary | ICD-10-CM

## 2024-07-11 LAB — CBC WITH DIFFERENTIAL/PLATELET
Basophils Absolute: 0.1 K/uL (ref 0.0–0.1)
Basophils Relative: 0.9 % (ref 0.0–3.0)
Eosinophils Absolute: 0.2 K/uL (ref 0.0–0.7)
Eosinophils Relative: 4.6 % (ref 0.0–5.0)
HCT: 41.8 % (ref 36.0–46.0)
Hemoglobin: 13.8 g/dL (ref 12.0–15.0)
Lymphocytes Relative: 40.6 % (ref 12.0–46.0)
Lymphs Abs: 2.2 K/uL (ref 0.7–4.0)
MCHC: 32.9 g/dL (ref 30.0–36.0)
MCV: 85.3 fl (ref 78.0–100.0)
Monocytes Absolute: 0.4 K/uL (ref 0.1–1.0)
Monocytes Relative: 6.6 % (ref 3.0–12.0)
Neutro Abs: 2.6 K/uL (ref 1.4–7.7)
Neutrophils Relative %: 47.3 % (ref 43.0–77.0)
Platelets: 350 K/uL (ref 150.0–400.0)
RBC: 4.91 Mil/uL (ref 3.87–5.11)
RDW: 15 % (ref 11.5–15.5)
WBC: 5.4 K/uL (ref 4.0–10.5)

## 2024-07-11 MED ORDER — BUDESONIDE-FORMOTEROL FUMARATE 160-4.5 MCG/ACT IN AERO: 2.0000 | INHALATION_SPRAY | Freq: Two times a day (BID) | RESPIRATORY_TRACT | 6 refills | Status: DC

## 2024-07-11 MED ORDER — ALBUTEROL SULFATE HFA 108 (90 BASE) MCG/ACT IN AERS: 2.0000 | INHALATION_SPRAY | Freq: Four times a day (QID) | RESPIRATORY_TRACT | 6 refills | Status: DC | PRN

## 2024-07-11 MED ORDER — FLUTICASONE PROPIONATE 50 MCG/ACT NA SUSP: 2.0000 | Freq: Every day | NASAL | 2 refills | Status: AC

## 2024-07-11 MED ORDER — FLUTICASONE PROPIONATE 50 MCG/ACT NA SUSP: 2.0000 | Freq: Every day | NASAL | 2 refills | Status: DC

## 2024-07-11 MED ORDER — ALBUTEROL SULFATE HFA 108 (90 BASE) MCG/ACT IN AERS: 2.0000 | INHALATION_SPRAY | Freq: Four times a day (QID) | RESPIRATORY_TRACT | 6 refills | Status: AC | PRN
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
## 2024-07-11 NOTE — Patient Instructions (Addendum)
 You were seen in the clinic for shortness of breath and coughing.   Your symptoms are non specific and will need an extended workup   Please start using the inhalers and nasal spray   Plan to get some PFTs and a CXR. Will also get some blood work  I will see you back in the clinic in 6 weeks.      Please adhere to a GERD diet   Foods to Avoid:  Acidic foods: Citrus fruits, tomatoes, tomato-based products, vinegar, garlic, onions  Fatty foods: Fried foods, fatty meats, whole milk, cheese  Spicy foods: Chili peppers, black pepper, mustard  Chocolate: Contains caffeine and theobromine, which relax the lower esophageal sphincter (LES)  Alcohol: Relaxes the LES and increases stomach acid production  Caffeine: Found in coffee, tea, and some sodas, it can stimulate stomach acid production    Foods to Eat:  Lean proteins: Chicken, fish, eggs Non-acidic fruits: Apples, bananas, pears, grapes Vegetables: Steamed, roasted, or boiled vegetables (e.g., broccoli, carrots, green beans) Whole grains: Brown rice, quinoa, oatmeal Low-fat dairy: Skim milk, low-fat yogurt, low-fat cheese Water: Staying hydrated helps dilute stomach acid    Other Dietary Recommendations: Eat smaller, more frequent meals. Avoid eating within 2-3 hours of bedtime. Chew food thoroughly. Limit sugary drinks and processed foods. Consider a Mediterranean-style diet, which is rich in fruits, vegetables, and whole grains.

## 2024-07-12 LAB — RESPIRATORY ALLERGY PANEL REGION II W/ RFLX: ~~LOC~~

## 2024-07-12 LAB — INTERPRETATION:

## 2024-07-29 ENCOUNTER — Ambulatory Visit

## 2024-08-08 ENCOUNTER — Ambulatory Visit: Payer: Self-pay

## 2024-08-08 NOTE — Telephone Encounter (Signed)
 Pt requesting medication to be called in for SOB

## 2024-08-08 NOTE — Telephone Encounter (Signed)
 FYI Only or Action Required?: Action required by provider: update on patient condition.  Patient is followed in Pulmonology for SOB/cough, last seen on 07/11/2024 by Hattar, Zola SAILOR, MD.  Called Nurse Triage reporting Shortness of Breath.  Symptoms began several days ago.  Interventions attempted: Rescue inhaler, Maintenance inhaler, and Increased fluids/rest.  Symptoms are: gradually worsening.  Triage Disposition: See Physician Within 24 Hours  Patient/caregiver understands and will follow disposition?: No, wishes to speak with PCP        Copied from CRM #[id number]. Topic: Clinical - Red Word Triage >> Aug 08, 2024 10:54 AM Rozanna MATSU wrote: Red Word that prompted transfer to Nurse Triage: Cough mucus clear and dry sometimes, shortness of breath Reason for Disposition  [1] Continuous (nonstop) coughing interferes with work or school AND [2] no improvement using cough treatment per Care Advice    Triager attempted to schedule with [facility], but pt declined d.t work. Pt requesting if Rx can be sent in.  Triager will forward encounter for Dr Zaida 's office to review and advise. Patient verbalized understanding and is expecting call back from office for next steps. Triager also advised that if pt does not hear back from office, to follow disposition for further evaluation/treatment.  Answer Assessment - Initial Assessment Questions E2C2 Pulmonary Triage - Initial Assessment Questions Chief Complaint (e.g., cough, sob, wheezing, fever, chills, sweat or additional symptoms) *Go to specific symptom protocol after initial questions. SOB, occasional productive cough Recent abx tx for UTI  How long have symptoms been present? A few days  Have you tested for COVID or Flu? Note: If not, ask patient if a home test can be taken. If so, instruct patient to call back for positive results. No  MEDICINES:   Have you used any OTC meds to help with symptoms? No If yes, ask What  medications? N/a  Have you used your inhalers/maintenance medication? Yes If yes, What medications? Albuterol  INH - uses 3-4x a day and provides relief Breyna  - as prescribed Triager reviewed/reinforced albuterol  usage and SIG.    If inhaler, ask How many puffs and how often? Note: Review instructions on medication in the chart. See above  OXYGEN: Do you wear supplemental oxygen? No If yes, How many liters are you supposed to use? N/a  Do you monitor your oxygen levels? No If yes, What is your reading (oxygen level) today? N/a  What is your usual oxygen saturation reading?  (Note: Pulmonary O2 sats should be 90% or greater) N/a        1. RESPIRATORY STATUS: Describe your breathing? (e.g., wheezing, shortness of breath, unable to speak, severe coughing)      See above 2. ONSET: When did this breathing problem begin?      See above 3. PATTERN Does the difficult breathing come and go, or has it been constant since it started?      Comes and goes 4. SEVERITY: How bad is your breathing? (e.g., mild, moderate, severe)      Mild SOB with coughing Triager does not appreciate audible SOB/wheezing during call. Pt is speaking in full sentences.  5. RECURRENT SYMPTOM: Have you had difficulty breathing before? If Yes, ask: When was the last time? and What happened that time?      N/a 6. CARDIAC HISTORY: Do you have any history of heart disease? (e.g., heart attack, angina, bypass surgery, angioplasty)      denies 7. LUNG HISTORY: Do you have any history of lung disease?  (e.g., pulmonary  embolus, asthma, emphysema)     denies 8. CAUSE: What do you think is causing the breathing problem?      unknown 9. OTHER SYMPTOMS: Do you have any other symptoms? (e.g., chest pain, cough, dizziness, fever, runny nose)     Cough, runny nose. 10. O2 SATURATION MONITOR:  Do you use an oxygen saturation monitor (pulse oximeter) at home? If Yes, ask: What  is your reading (oxygen level) today? What is your usual oxygen saturation reading? (e.g., 95%)       nA 11. PREGNANCY: Is there any chance you are pregnant? When was your last menstrual period?       N/a 12. TRAVEL: Have you traveled out of the country in the last month? (e.g., travel history, exposures)       N/a  Protocols used: Breathing Difficulty-A-AH, Cough - Acute Non-Productive-A-AH

## 2024-08-09 ENCOUNTER — Other Ambulatory Visit: Payer: Self-pay

## 2024-08-09 DIAGNOSIS — R053 Chronic cough: Secondary | ICD-10-CM

## 2024-08-09 DIAGNOSIS — J4541 Moderate persistent asthma with (acute) exacerbation: Secondary | ICD-10-CM

## 2024-08-09 DIAGNOSIS — R0602 Shortness of breath: Secondary | ICD-10-CM

## 2024-08-09 MED ORDER — PREDNISONE 10 MG PO TABS: 40.0000 mg | ORAL_TABLET | Freq: Every day | ORAL | 0 refills | Status: DC

## 2024-08-12 ENCOUNTER — Other Ambulatory Visit: Payer: Self-pay | Admitting: Emergency Medicine

## 2024-08-12 ENCOUNTER — Inpatient Hospital Stay: Admission: RE | Admit: 2024-08-12 | Source: Ambulatory Visit

## 2024-08-12 DIAGNOSIS — Z1231 Encounter for screening mammogram for malignant neoplasm of breast: Secondary | ICD-10-CM

## 2024-08-26 ENCOUNTER — Telehealth: Payer: Self-pay

## 2024-08-26 DIAGNOSIS — J4541 Moderate persistent asthma with (acute) exacerbation: Secondary | ICD-10-CM

## 2024-08-26 DIAGNOSIS — R0602 Shortness of breath: Secondary | ICD-10-CM

## 2024-08-26 DIAGNOSIS — R053 Chronic cough: Secondary | ICD-10-CM

## 2024-08-26 NOTE — Telephone Encounter (Unsigned)
 Copied from CRM [id number]. Topic: Clinical - Order For Equipment >> Aug 26, 2024  4:02 PM Autumn Gordon wrote: Reason for CRM: Pt is requesting an order to be placed for a nebulizer as well as a nebulizer solution to assist her COPD as she feels like fast meds such as prednisone  and her inhalers haven't been helping her health. Pt stated she still is having issues with her breathing.  Pt's preferred pharmacy for the solution is [id number] - [place] ([place]), [place] - [address]  Pt's phone number is [phone] ok to leave a vm.

## 2024-08-27 NOTE — Telephone Encounter (Signed)
 Patient is using her Albuterol  2 puffs every 4-6 hours.  She has cut down on smoking, some days does not smoke at all.  She is wearing the nicotine  patch and nicotine  gum to quit smoking.  She says she was good for 17 years with her asthma, but the mold problem is causing her to have problems now.  She is having  shortness of breath, wheezing, coughing to the point of vomiting.  She states that she really does not have a problem with acid reflux.  Symbicort  may have helped some, but not a lot.  She says the prednisone  was pushing back the symptoms, but did not completely relieve the symptoms.  She is scheduled for a PFT and f/u with Dr. Zaida on 10/20.  I advised her that Dr. Zaida is not in the office today, but will be back in the office tomorrow.  She was ok with me sending a message to him today for him to address tomorrow.  Dr. Zaida, Patient states she is not getting the relief from the Symbicort , prednisone  and albuterol .  She is requesting that an order be sent in for a nebulizer machine and albuterol  nebulizer solution.  Please advise.  Thank you.

## 2024-08-28 ENCOUNTER — Telehealth: Payer: Self-pay

## 2024-08-28 MED ORDER — IPRATROPIUM-ALBUTEROL 0.5-2.5 (3) MG/3ML IN SOLN: 3.0000 mL | Freq: Four times a day (QID) | RESPIRATORY_TRACT | 1 refills | Status: AC | PRN

## 2024-08-28 MED ORDER — IPRATROPIUM-ALBUTEROL 0.5-2.5 (3) MG/3ML IN SOLN: 3.0000 mL | Freq: Four times a day (QID) | RESPIRATORY_TRACT | 1 refills | Status: DC | PRN

## 2024-08-28 NOTE — Telephone Encounter (Signed)
 Per Avelina at [facility]-  Can we have provider add to their August OV note mentioning the need for the neb.

## 2024-08-28 NOTE — Telephone Encounter (Signed)
 Per Dr. Zaida:    08/28/24 12:20 PM Can we please send her a nebulizer machine to try with Duo nebs q6h as needed ?    Thanks    I have placed neb machine order and rx for Duoneb sent to preferred pharm. I called and spoke with the pt and notified her that DME will reach out about machine and supplies and she [facility]. Nothing further needed per pt.

## 2024-08-28 NOTE — Telephone Encounter (Signed)
 Copied from CRM [id number]. Topic: Clinical - Medical Advice >> Aug 26, 2024  4:05 PM Celestine FALCON wrote: Reason for CRM: Pt is seeking medical advice on treatment for COPD since prednisone  isn't working long term for her. She stated she takes an inhaler as well as nasal spray, but she still has trouble with her breathing. The medications are budesonide -formoterol  (SYMBICORT ) 160-4.5 MCG/ACT inhaler and fluticasone  (FLONASE ) 50 MCG/ACT nasal spray. Pt would like to hear any suggestions to improve her health as well as requested a nebulizer with a nebulizer solution.  Pt's phone number is [phone] ok to leave a vm.     Spoke w/ PT VBU, will talk more about her concern at next office appointment.    Request a letter about being seen  Request sent to Dr. Zaida & nurse to advise

## 2024-08-29 ENCOUNTER — Telehealth: Payer: Self-pay

## 2024-08-29 NOTE — Telephone Encounter (Signed)
 Copied from CRM [id number]. Topic: Clinical - Order For Equipment >> Aug 26, 2024  4:02 PM Autumn Gordon wrote: Reason for CRM: Pt is requesting an order to be placed for a nebulizer as well as a nebulizer solution to assist her COPD as she feels like fast meds such as prednisone  and her inhalers haven't been helping her health. Pt stated she still is having issues with her breathing.  Pt's preferred pharmacy for the solution is [id number] - [place] ([place]), [place] - [address]  Pt's phone number is [phone] ok to leave a vm. >> Aug 29, 2024  9:11 AM Autumn Gordon wrote: Pt is contacting clinic regarding order for nebulizer, checking status. Rev chart and advised order was sent to [facility] on 10/15 and they had requested 06/2024 OV note to be sent as well. Advised provider would be attaching OV note and should be sent in next couple of days.   Pt requested if order could be made STAT due to her breathing issues and wanting to prevent ER visits  CB#  [phone]   Spoke w/ PT VBU - Referral has been placed to [facility] can take a few business days, she gonna reach out to pharmacy and see if they have a cheaper Neb machine that she can get until then   -NFN

## 2024-08-30 NOTE — Telephone Encounter (Signed)
 Dr Zaida- will you please add note discussing need for neb to your most recent note.

## 2024-08-30 NOTE — Telephone Encounter (Signed)
 Patient call checking on status of Neb machine order. Wants to make sure DME has received and time frame for delivery.

## 2024-09-02 ENCOUNTER — Ambulatory Visit (INDEPENDENT_AMBULATORY_CARE_PROVIDER_SITE_OTHER)

## 2024-09-02 VITALS — BP 124/84 | HR 85 | Temp 98.5°F | Ht 66.25 in | Wt 195.0 lb

## 2024-09-02 DIAGNOSIS — R0602 Shortness of breath: Secondary | ICD-10-CM | POA: Diagnosis not present

## 2024-09-02 DIAGNOSIS — J4520 Mild intermittent asthma, uncomplicated: Secondary | ICD-10-CM

## 2024-09-02 DIAGNOSIS — R053 Chronic cough: Secondary | ICD-10-CM

## 2024-09-02 LAB — PULMONARY FUNCTION TEST
DL/VA % pred: 89 %
DL/VA: 3.77 ml/min/mmHg/L
DLCO cor % pred: 53 %
DLCO cor: 11.96 ml/min/mmHg
DLCO unc % pred: 54 %
DLCO unc: 12.11 ml/min/mmHg
FEF 25-75 Post: 0.38 L/s
FEF 25-75 Pre: 0.6 L/s
FEF2575-%Change-Post: -37 %
FEF2575-%Pred-Post: 13 %
FEF2575-%Pred-Pre: 21 %
FEV1-%Change-Post: -27 %
FEV1-%Pred-Post: 32 %
FEV1-%Pred-Pre: 45 %
FEV1-Post: 0.97 L
FEV1-Pre: 1.34 L
FEV1FVC-%Change-Post: -5 %
FEV1FVC-%Pred-Pre: 70 %
FEV6-%Change-Post: -21 %
FEV6-%Pred-Post: 49 %
FEV6-%Pred-Pre: 63 %
FEV6-Post: 1.82 L
FEV6-Pre: 2.33 L
FEV6FVC-%Change-Post: 0 %
FEV6FVC-%Pred-Post: 102 %
FEV6FVC-%Pred-Pre: 102 %
FVC-%Change-Post: -23 %
FVC-%Pred-Post: 48 %
FVC-%Pred-Pre: 63 %
FVC-Post: 1.82 L
FVC-Pre: 2.37 L
Post FEV1/FVC ratio: 53 %
Post FEV6/FVC ratio: 100 %
Pre FEV1/FVC ratio: 56 %
Pre FEV6/FVC Ratio: 99 %
RV % pred: 105 %
RV: 2.06 L
TLC % pred: 74 %
TLC: 4.02 L

## 2024-09-02 MED ORDER — BUDESONIDE 0.5 MG/2ML IN SUSP: 0.5000 mg | Freq: Every day | RESPIRATORY_TRACT | 12 refills | Status: AC
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
## 2024-09-02 NOTE — Progress Notes (Signed)
 Full pft performed today

## 2024-09-02 NOTE — Patient Instructions (Signed)
  VISIT SUMMARY: During your visit, we discussed your ongoing shortness of breath and chest pain, which you believe may be related to mold exposure in your home. We reviewed your history of COPD and asthma, and noted that your symptoms have worsened recently. You have been using inhalers without much relief and have recently quit smoking. We also discussed your chest pain, which is similar to past rib fractures, and your high blood pressure, which is being managed with medication.  YOUR PLAN: -PRESUMED REACTIVE AIRWAY DISEASE WITH PERSISTENT DYSPNEA AND COUGH: Reactive airway disease is a condition where the airways overreact to various stimuli, causing difficulty breathing and coughing. We will order a chest CT to get a clearer picture of your lungs. You will be prescribed a nebulizer machine with budesonide  solution to help manage your symptoms. You should stop using the Symbicort  inhaler but continue with the DuoNeb solution.  -CHEST PAIN ASSOCIATED WITH RESPIRATORY SYMPTOMS: Your chest pain, which is similar to past rib fractures, is likely contributing to your difficulty in breathing deeply. To manage the pain, you should take 400-600 mg of ibuprofen  with food, up to three times daily, and 1000 mg of Tylenol , up to three times daily.  -ESSENTIAL HYPERTENSION: Essential hypertension is high blood pressure with no identifiable cause. Your high blood pressure is being managed with lisinopril, which you resumed taking in 2023.  INSTRUCTIONS: We will schedule a chest CT to further investigate your lung condition. Please continue using the DuoNeb solution and start using the nebulizer with budesonide  as prescribed. Stop using the Symbicort  inhaler. For pain management, take ibuprofen  and Tylenol  as directed. Continue taking your high blood pressure medication, lisinopril. Follow up with us  after your chest CT for further evaluation.

## 2024-09-02 NOTE — Patient Instructions (Signed)
 Full pft performed today

## 2024-09-02 NOTE — Addendum Note (Signed)
 Addended by: Shanecia Hoganson M on: 09/02/2024 02:43 PM   Modules accepted: Orders

## 2024-09-02 NOTE — Telephone Encounter (Signed)
 Zaida Zola SAILOR, MD to Me      09/02/24  8:23 AM Can't really add it, as we dont have an indication for it at this time. She will need to get PFTs to prove we have something we can treat, in the meantime we can tell her we can't prescribe the machine before she does the PFTs.   I called and spoke with the Autumn Gordon and notified of response from Dr. Zaida. She verbalized understanding. Nothing further needed at this time.

## 2024-09-05 ENCOUNTER — Ambulatory Visit: Payer: Self-pay

## 2024-09-05 ENCOUNTER — Ambulatory Visit
Admission: RE | Admit: 2024-09-05 | Discharge: 2024-09-05 | Disposition: A | Discharge status: Home / Self Care | Source: Ambulatory Visit

## 2024-09-05 DIAGNOSIS — J4520 Mild intermittent asthma, uncomplicated: Secondary | ICD-10-CM

## 2024-09-05 NOTE — Telephone Encounter (Signed)
 Patient on another call, requested a call back in 10 minutes. Routing for call back.     Copied from CRM [id number]. Topic: Clinical - Red Word Triage >> Sep 05, 2024  4:47 PM Lavanda D wrote: Red Word that prompted transfer to Nurse Triage: Patient experiencing trouble breathing and is waiting on her nebulizer order from [facility]. Coughing a lot/runny nose, spitting up lots of phlegm. She has an appointment right now 5pm EST for a CT scan and she is unsure how long it will take but she did agree to a call back.

## 2024-09-05 NOTE — Telephone Encounter (Addendum)
 FYI Only or Action Required?: FYI only for provider.  Patient is followed in Pulmonology for Intermittent reactive Airway Disease, last seen on 09/02/2024 by Hattar, Zola SAILOR, MD.  Called Nurse Triage reporting Shortness of Breath.  Symptoms began several days ago.  Interventions attempted: Prescription medications: Albuterol  inhaler.  Symptoms are: gradually worsening.  Triage Disposition: See HCP Within 4 Hours (Or PCP Triage)  Patient/caregiver understands and will follow disposition?: Yes  Advised ED for worsening symptoms.  Reason for Disposition  [1] Longstanding difficulty breathing (e.g., CHF, COPD, emphysema) AND [2] WORSE than normal  Answer Assessment - Initial Assessment Questions Reports she was supposed to have neb machine delivered today by [facility]. Hey canceled thinking pt already had one. [facility] talked to pts insurance and discovered she didn't have one. Pt called [facility] to have it delivered but it was already 5pm, they stated it could be delivered tomorrow unless someone from the pulmonology office called the [facility] after hours line to have it delivered tonight. Pulm office currently closed. Advised pt to go to UC, agreeable to go. Reports she has been using albuterol  inhaler 2 puffs every 4 hours in the meatime. Went to UC on Sunday and received neb tx which was helpful.  1. RESPIRATORY STATUS: Describe your breathing? (e.g., wheezing, shortness of breath, unable to speak, severe coughing)      Moderate SOB, coughing a lot  2. ONSET: When did this breathing problem begin?      20 23 d/t mold in house, has gradually gotten worse  3. PATTERN Does the difficult breathing come and go, or has it been constant since it started?      constant  4. SEVERITY: How bad is your breathing? (e.g., mild, moderate, severe)      Moderate sob  5. RECURRENT SYMPTOM: Have you had difficulty breathing before? If Yes, ask: When was the last time? and What happened that time?       Off and on since 2023, worse over past few weeks.  6. CARDIAC HISTORY: Do you have any history of heart disease? (e.g., heart attack, angina, bypass surgery, angioplasty)      Enlarged heart  7. LUNG HISTORY: Do you have any history of lung disease?  (e.g., pulmonary embolus, asthma, emphysema)     Persistent reactive airway disease  8. CAUSE: What do you think is causing the breathing problem?      Mold in house  9. OTHER SYMPTOMS: Do you have any other symptoms? (e.g., chest pain, cough, dizziness, fever, runny nose)     Cough, feels feverish  10. O2 SATURATION MONITOR:  Do you use an oxygen saturation monitor (pulse oximeter) at home? If Yes, ask: What is your reading (oxygen level) today? What is your usual oxygen saturation reading? (e.g., 95%)       No. Does not wear home O2.  11. PREGNANCY: Is there any chance you are pregnant? When was your last menstrual period?       No  12. TRAVEL: Have you traveled out of the country in the last month? (e.g., travel history, exposures)       No  Protocols used: Breathing Difficulty-A-AH

## 2024-09-06 NOTE — Telephone Encounter (Signed)
 Noted. Nothing further needed.

## 2024-09-09 ENCOUNTER — Ambulatory Visit: Payer: Self-pay

## 2024-09-09 NOTE — Progress Notes (Signed)
 Called and spoke to pt - advised of CT results per Dr. Zaida. Pt verbalized understanding, NFN.

## 2024-09-19 ENCOUNTER — Ambulatory Visit (HOSPITAL_BASED_OUTPATIENT_CLINIC_OR_DEPARTMENT_OTHER): Admitting: Pulmonary Disease

## 2024-09-28 ENCOUNTER — Telehealth: Payer: Self-pay | Admitting: Pulmonary Disease

## 2024-09-28 DIAGNOSIS — J209 Acute bronchitis, unspecified: Secondary | ICD-10-CM

## 2024-09-28 MED ORDER — BENZONATATE 100 MG PO CAPS: 100.0000 mg | ORAL_CAPSULE | Freq: Four times a day (QID) | ORAL | 1 refills | Status: DC | PRN

## 2024-09-28 MED ORDER — AZITHROMYCIN 250 MG PO TABS
ORAL_TABLET | ORAL | 0 refills | Status: AC
Start: 2024-09-28 — End: ?

## 2024-09-28 MED ORDER — PREDNISONE 10 MG PO TABS: ORAL_TABLET | ORAL | 0 refills | Status: DC

## 2024-09-28 NOTE — Telephone Encounter (Signed)
 When she breaths in and out. She has phlegm and it makes her cough. She has a mold issues in her house. Migraine. A lot of phlegm. Its clear phlegm. Nose running. Sore throat +. She has a nebulizer and is using inhaler. She also uses flonase .Previously prednisone  did not help much. Wheezing +. No sick contacts. She has black mold.  Medications: - Symbicort  2 puffs BID - Duoneb nebulizer every 2-4 hours, only thing that helps  Recommendations: - Start prednisone  dose pack - Start Z pack - Use nebulizer every 4 hours as needed - Set up appointment in office if not better by mid week. - Discussed warning signs to go to ED.

## 2024-09-30 NOTE — Telephone Encounter (Signed)
 Called and got patient scheduled for acute visit with dr.hunsucker to be seen tomorrow for eval,is not feeling any better would like a nausea script sent in to aid with current nausea

## 2024-10-01 ENCOUNTER — Emergency Department (HOSPITAL_BASED_OUTPATIENT_CLINIC_OR_DEPARTMENT_OTHER)

## 2024-10-01 ENCOUNTER — Ambulatory Visit: Admitting: Pulmonary Disease

## 2024-10-01 ENCOUNTER — Other Ambulatory Visit: Payer: Self-pay

## 2024-10-01 ENCOUNTER — Ambulatory Visit: Payer: Self-pay

## 2024-10-01 ENCOUNTER — Telehealth: Payer: Self-pay

## 2024-10-01 ENCOUNTER — Encounter (HOSPITAL_BASED_OUTPATIENT_CLINIC_OR_DEPARTMENT_OTHER): Payer: Self-pay

## 2024-10-01 ENCOUNTER — Emergency Department (HOSPITAL_BASED_OUTPATIENT_CLINIC_OR_DEPARTMENT_OTHER)
Admission: EM | Admit: 2024-10-01 | Discharge: 2024-10-01 | Disposition: A | Discharge status: Home / Self Care | Source: Ambulatory Visit | Attending: Emergency Medicine | Admitting: Emergency Medicine

## 2024-10-01 DIAGNOSIS — D72829 Elevated white blood cell count, unspecified: Secondary | ICD-10-CM | POA: Diagnosis not present

## 2024-10-01 DIAGNOSIS — R0602 Shortness of breath: Secondary | ICD-10-CM | POA: Insufficient documentation

## 2024-10-01 DIAGNOSIS — R110 Nausea: Secondary | ICD-10-CM | POA: Insufficient documentation

## 2024-10-01 DIAGNOSIS — J45909 Unspecified asthma, uncomplicated: Secondary | ICD-10-CM | POA: Diagnosis not present

## 2024-10-01 DIAGNOSIS — J449 Chronic obstructive pulmonary disease, unspecified: Secondary | ICD-10-CM | POA: Diagnosis not present

## 2024-10-01 DIAGNOSIS — R0781 Pleurodynia: Secondary | ICD-10-CM | POA: Insufficient documentation

## 2024-10-01 DIAGNOSIS — R1011 Right upper quadrant pain: Secondary | ICD-10-CM | POA: Diagnosis not present

## 2024-10-01 DIAGNOSIS — I10 Essential (primary) hypertension: Secondary | ICD-10-CM

## 2024-10-01 DIAGNOSIS — Z79899 Other long term (current) drug therapy: Secondary | ICD-10-CM | POA: Insufficient documentation

## 2024-10-01 DIAGNOSIS — R0789 Other chest pain: Secondary | ICD-10-CM

## 2024-10-01 LAB — CBC
HCT: 38.6 % (ref 36.0–46.0)
Hemoglobin: 13 g/dL (ref 12.0–15.0)
MCH: 28.3 pg (ref 26.0–34.0)
MCHC: 33.7 g/dL (ref 30.0–36.0)
MCV: 84.1 fL (ref 80.0–100.0)
Platelets: 351 K/uL (ref 150–400)
RBC: 4.59 MIL/uL (ref 3.87–5.11)
RDW: 14.7 % (ref 11.5–15.5)
WBC: 12.9 K/uL — ABNORMAL HIGH (ref 4.0–10.5)
nRBC: 0 % (ref 0.0–0.2)

## 2024-10-01 LAB — COMPREHENSIVE METABOLIC PANEL WITH GFR
ALT: 13 U/L (ref 0–44)
AST: 16 U/L (ref 15–41)
Albumin: 3.7 g/dL (ref 3.5–5.0)
Alkaline Phosphatase: 73 U/L (ref 38–126)
Anion gap: 10 (ref 5–15)
BUN: 11 mg/dL (ref 6–20)
CO2: 24 mmol/L (ref 22–32)
Calcium: 9.4 mg/dL (ref 8.9–10.3)
Chloride: 106 mmol/L (ref 98–111)
Creatinine, Ser: 0.58 mg/dL (ref 0.44–1.00)
GFR, Estimated: >60 mL/min (ref >60)
Glucose, Bld: 86 mg/dL (ref 70–99)
Potassium: 3.8 mmol/L (ref 3.5–5.1)
Sodium: 140 mmol/L (ref 135–145)
Total Bilirubin: 0.4 mg/dL (ref 0.0–1.2)
Total Protein: 6.7 g/dL (ref 6.5–8.1)

## 2024-10-01 LAB — TROPONIN T, HIGH SENSITIVITY: Troponin T High Sensitivity: <15 ng/L (ref 0–19)

## 2024-10-01 LAB — HCG, SERUM, QUALITATIVE: Preg, Serum: NEGATIVE

## 2024-10-01 LAB — D-DIMER, QUANTITATIVE: D-Dimer, Quant: 0.46 ug{FEU}/mL (ref 0.00–0.50)

## 2024-10-01 LAB — LIPASE, BLOOD: Lipase: 16 U/L (ref 11–51)

## 2024-10-01 MED ORDER — NAPROXEN 500 MG PO TABS: 500.0000 mg | ORAL_TABLET | Freq: Two times a day (BID) | ORAL | 0 refills | Status: AC

## 2024-10-01 MED ORDER — KETOROLAC TROMETHAMINE 15 MG/ML IJ SOLN
15.0000 mg | Freq: Once | INTRAMUSCULAR | Status: AC
  Administered 2024-10-01: 15 mg via INTRAVENOUS
  Filled 2024-10-01: qty 1

## 2024-10-01 MED ORDER — IPRATROPIUM-ALBUTEROL 0.5-2.5 (3) MG/3ML IN SOLN
RESPIRATORY_TRACT | Status: AC
  Filled 2024-10-01: qty 3

## 2024-10-01 MED ORDER — LIDOCAINE 5 % EX PTCH: 1.0000 | MEDICATED_PATCH | CUTANEOUS | 0 refills | Status: DC

## 2024-10-01 MED ORDER — LISINOPRIL 10 MG PO TABS: 10.0000 mg | ORAL_TABLET | Freq: Every day | ORAL | 0 refills | Status: DC

## 2024-10-01 MED ORDER — HYDROCODONE-ACETAMINOPHEN 5-325 MG PO TABS
1.0000 | ORAL_TABLET | Freq: Once | ORAL | Status: AC
  Administered 2024-10-01: 1 via ORAL
  Filled 2024-10-01: qty 1

## 2024-10-01 MED ORDER — IPRATROPIUM-ALBUTEROL 0.5-2.5 (3) MG/3ML IN SOLN
3.0000 mL | Freq: Once | RESPIRATORY_TRACT | Status: AC
  Administered 2024-10-01: 3 mL via RESPIRATORY_TRACT

## 2024-10-01 MED ORDER — BENZONATATE 100 MG PO CAPS
100.0000 mg | ORAL_CAPSULE | Freq: Once | ORAL | Status: AC
  Administered 2024-10-01: 100 mg via ORAL
  Filled 2024-10-01: qty 1

## 2024-10-01 MED ORDER — ONDANSETRON HCL 4 MG/2ML IJ SOLN
4.0000 mg | Freq: Once | INTRAMUSCULAR | Status: AC
  Administered 2024-10-01: 4 mg via INTRAVENOUS
  Filled 2024-10-01: qty 2

## 2024-10-01 MED ORDER — ONDANSETRON 4 MG PO TBDP: 4.0000 mg | ORAL_TABLET | Freq: Three times a day (TID) | ORAL | 0 refills | Status: AC | PRN

## 2024-10-01 MED ORDER — METHYLPREDNISOLONE SODIUM SUCC 125 MG IJ SOLR
125.0000 mg | Freq: Once | INTRAMUSCULAR | Status: AC
  Administered 2024-10-01: 125 mg via INTRAVENOUS
  Filled 2024-10-01: qty 2

## 2024-10-01 MED ORDER — LIDOCAINE 5 % EX PTCH
1.0000 | MEDICATED_PATCH | CUTANEOUS | Status: DC
  Administered 2024-10-01: 1 via TRANSDERMAL
  Filled 2024-10-01: qty 1

## 2024-10-01 NOTE — Telephone Encounter (Signed)
 Copied from CRM [id number]. Topic: Clinical - Prescription Issue >> Oct 01, 2024  3:12 PM Corean SAUNDERS wrote: Reason for CRM: Patient seen in ED today after speaking E2C2 Pulmonary triage nurse and was prescribed lidocaine  (LIDODERM ) 5 % 1 patch   [[id number]] but states it will require prior authorization, patient is asking if  Dr. Zaida could please initiate the prior authorization for her.  Patient is also inquiring on if her pulmonary care doctor can refill her lisinopril (ZESTRIL) 10 MG tablet as she does not have PCP or any one else to fill it for her.   Pharmacy can you please advise PA for lidocaine  as listed above.   Dr. Zaida please advise if you would like to refill pts Lisinopril

## 2024-10-01 NOTE — ED Notes (Signed)
 Patient ambulated around department with pulse ox. Patient remained 95-97% on room air.

## 2024-10-01 NOTE — Telephone Encounter (Addendum)
 Pt states that she woke with worsening CP, states that pain is worse with breathing. Pt speaking in 1-2 word phrases and states that she is SOB. Pt states that she does not want to go to the ED d/t wait times, RN explained need for ED treatment, pt agreeable. Pt advised to go to ED via ambulance. Pt states she will call 911.  Copied from CRM #[id number]. Topic: Clinical - Red Word Triage >> Oct 01, 2024  8:28 AM Nathanel DEL wrote: Red Word that prompted transfer to Nurse Triage: pt states hurts to breathe, lungs hurt, sharp pain in lower rib cage. (She sounds awful, I'm worried for her) Has appt 10:30 am, wants sooner appt

## 2024-10-01 NOTE — Discharge Instructions (Addendum)
 It was a pleasure taking care of you today.  As discussed, your workup is reassuring.  Continue taking your steroids as previously prescribed.  I am sending you home with pain medication.  Take as needed for pain.  I have also written a prescription for Lidoderm  patches.  You may also purchase over-the-counter Voltaren  gel.  Please follow-up with your pulmonologist for further evaluation.  Return to the ER for any worsening symptoms.

## 2024-10-01 NOTE — ED Triage Notes (Signed)
 Presents to ED with c/o SOB and pain to chest. Pt states it started yesterday and woke her up out of sleep this AM. Reports pain is to R back and L side under ribs. Pain with taking breaths. RT in triage and started duoneb for increased WOB.

## 2024-10-01 NOTE — Telephone Encounter (Signed)
 Called and spoke with the pt. Advised pt Dr. Zaida would refill rx and to get Lidocaine  otc.  Nothing further needed.

## 2024-10-01 NOTE — Telephone Encounter (Signed)
 I called and spoke to pt. Pt states she is in the triage room at the [facility] ER and will need to cancel todays appt. NFN

## 2024-10-01 NOTE — ED Provider Notes (Signed)
 [place] EMERGENCY DEPARTMENT AT [facility] Provider Note   CSN: [id number] Arrival date & time: 10/01/24  9073     Patient presents with: Shortness of Breath and Chest Pain   Autumn Gordon is a 53 y.o. female with a past medical history significant for fibromyalgia, asthma, GERD, depression, anxiety, hypertension, and COPD who presents to the ED due to shortness of breath and pleuritic chest pain.  Patient states she has had right sided rib pain since yesterday worse with deep inspiration.  Currently following pulmonology.  Has had issues with breathing for the past 2 years due to black mold issues at home per patient. Admits to a dry cough.  Patient used her nebulizer treatment prior to arrival and was given 1 in triage prior to initial evaluation.  Patient is most concerned about right sided rib pain.  No injury.  No history of blood clots, recent surgeries, recent long immobilizations, or hormonal treatments.  Denies lower extremity edema.  No orthopnea.  Denies cardiac history.  Also endorses some nausea.  History obtained from patient and past medical records. No interpreter used during encounter.       Prior to Admission medications   Medication Sig Start Date End Date Taking? Authorizing Provider  lidocaine  (LIDODERM ) 5 % Place 1 patch onto the skin daily. Remove & Discard patch within 12 hours or as directed by MD 10/01/24  Yes Diesel Lina, Aleck BROCKS, PA-C  naproxen  (NAPROSYN ) 500 MG tablet Take 1 tablet (500 mg total) by mouth 2 (two) times daily. 10/01/24  Yes Chinara Hertzberg, Aleck BROCKS, PA-C  albuterol  (VENTOLIN  HFA) 108 (90 Base) MCG/ACT inhaler Inhale 2 puffs into the lungs every 6 (six) hours as needed for wheezing or shortness of breath. 07/11/24   Hattar, Zola SAILOR, MD  azithromycin (ZITHROMAX) 250 MG tablet 2 tabs today, then 1 tab daily on days 2-5 09/28/24   Alghanim, Fahid, MD  benzonatate (TESSALON) 100 MG capsule Take 1 capsule (100 mg total) by mouth every 6  (six) hours as needed for cough. 09/28/24   Alghanim, Paula, MD  budesonide  (PULMICORT ) 0.5 MG/2ML nebulizer solution Take 2 mLs (0.5 mg total) by nebulization daily. 09/02/24   Hattar, Zola SAILOR, MD  diclofenac  (VOLTAREN ) 75 MG EC tablet Take 1 tablet (75 mg total) by mouth 2 (two) times daily. 05/12/21   Sofia, Leslie K, PA-C  fluticasone  (FLONASE ) 50 MCG/ACT nasal spray Place 2 sprays into both nostrils daily. 07/11/24   Hattar, Zola SAILOR, MD  ibuprofen  (ADVIL ) 200 MG tablet Take 200 mg by mouth every 4 (four) hours as needed for mild pain.    [provider]  ipratropium-albuterol  (DUONEB) 0.5-2.5 (3) MG/3ML SOLN Take 3 mLs by nebulization every 6 (six) hours as needed. 08/28/24   Hattar, Zola SAILOR, MD  lisinopril (ZESTRIL) 10 MG tablet Take 10 mg by mouth daily. 09/18/23   [provider]  methocarbamol  (ROBAXIN ) 750 MG tablet Take 1 tablet (750 mg total) by mouth 4 (four) times daily. 12/24/23   Odell Balls, PA-C  nicotine  (NICODERM CQ  - DOSED IN MG/24 HOURS) 14 mg/24hr patch Place 14 mg onto the skin daily.    [provider]  nitrofurantoin , macrocrystal-monohydrate, (MACROBID ) 100 MG capsule Take 1 capsule (100 mg total) by mouth 2 (two) times daily. 12/24/23   Odell Balls, PA-C  oxyCODONE -acetaminophen  (PERCOCET/ROXICET) 5-325 MG tablet Take 1 tablet by mouth every 6 (six) hours as needed for severe pain (pain score 7-10). 12/24/23   Odell Balls, PA-C  predniSONE  (DELTASONE )  10 MG tablet Take 4 tablets (40 mg total) by mouth daily with breakfast. 08/09/24   Hattar, Zola SAILOR, MD  predniSONE  (DELTASONE ) 10 MG tablet Take 6 tablets on day 1 decreasing dose by one tablet per day until gone 09/28/24   Alghanim, Fahid, MD    Allergies: Shellfish allergy  and Tramadol    Review of Systems  Constitutional:  Negative for chills and fever.  Respiratory:  Positive for cough and shortness of breath.   Cardiovascular:  Positive for chest pain.  Gastrointestinal:  Positive for  nausea.    Updated Vital Signs BP (!) 141/84   Pulse 71   Temp 98 F (36.7 C)   Resp (!) 9   Ht 5' 6 (1.676 m)   Wt 88 kg   SpO2 99%   BMI 31.31 kg/m   Physical Exam Vitals and nursing note reviewed.  Constitutional:      General: She is not in acute distress.    Appearance: She is not ill-appearing.  HENT:     Head: Normocephalic.  Eyes:     Pupils: Pupils are equal, round, and reactive to light.  Cardiovascular:     Rate and Rhythm: Normal rate and regular rhythm.     Pulses: Normal pulses.     Heart sounds: Normal heart sounds. No murmur heard.    No friction rub. No gallop.  Pulmonary:     Effort: Pulmonary effort is normal.     Breath sounds: Normal breath sounds.     Comments: Respirations equal and unlabored, patient able to speak in full sentences, lungs clear to auscultation bilaterally Chest:       Comments: TTP throughout right side ribs without crepitus or deformity Abdominal:     General: Abdomen is flat. There is no distension.     Palpations: Abdomen is soft.     Tenderness: There is abdominal tenderness. There is no guarding or rebound.     Comments: RUQ tenderness  Musculoskeletal:        General: Normal range of motion.     Cervical back: Neck supple.     Comments: No lower extremity edema. No calf tenderness  Skin:    General: Skin is warm and dry.  Neurological:     General: No focal deficit present.     Mental Status: She is alert.  Psychiatric:        Mood and Affect: Mood normal.        Behavior: Behavior normal.     (all labs ordered are listed, but only abnormal results are displayed) Labs Reviewed  CBC - Abnormal; Notable for the following components:      Result Value   WBC 12.9 (*)    All other components within normal limits  D-DIMER, QUANTITATIVE  HCG, SERUM, QUALITATIVE  COMPREHENSIVE METABOLIC PANEL WITH GFR  LIPASE, BLOOD  TROPONIN T, HIGH SENSITIVITY    EKG: EKG Interpretation Date/Time:  Tuesday October 01 2024 09:49:56 EST Ventricular Rate:  85 PR Interval:  150 QRS Duration:  87 QT Interval:  369 QTC Calculation: 439 R Axis:   63  Text Interpretation: Sinus rhythm Left atrial enlargement Confirmed by Zackowski, Scott [phone]) on 10/01/2024 9:52:33 AM  Radiology: US  Abdomen Limited RUQ (LIVER/GB) Result Date: 10/01/2024 CLINICAL DATA:  Right upper quadrant pain 2 days. EXAM: ULTRASOUND ABDOMEN LIMITED RIGHT UPPER QUADRANT COMPARISON:  CT 12/24/2023 and ultrasound 03/29/2014 FINDINGS: Gallbladder: No gallstones or wall thickening visualized. No sonographic Murphy sign noted by sonographer. Common bile  duct: Diameter: 3.8 mm. Liver: No focal lesion identified. Within normal limits in parenchymal echogenicity. Portal vein is patent on color Doppler imaging with normal direction of blood flow towards the liver. Other: No free fluid. IMPRESSION: Normal right upper quadrant ultrasound. Electronically Signed   By: Toribio Agreste M.D.   On: 10/01/2024 11:51   DG Chest Portable 1 View Result Date: 10/01/2024 EXAM: 1 VIEW(S) XRAY OF THE CHEST 10/01/2024 11:06:09 AM COMPARISON: 07/11/2024 CLINICAL HISTORY: CP FINDINGS: LUNGS AND PLEURA: No focal pulmonary opacity. No pleural effusion. No pneumothorax. HEART AND MEDIASTINUM: No acute abnormality of the cardiac and mediastinal silhouettes. BONES AND SOFT TISSUES: No acute osseous abnormality. IMPRESSION: 1. No acute cardiopulmonary process. Electronically signed by: Lynwood Seip MD 10/01/2024 11:27 AM EST RP Workstation: [id number]     Procedures   Medications Ordered in the ED  lidocaine  (LIDODERM ) 5 % 1 patch (1 patch Transdermal Patch Applied 10/01/24 1259)  ipratropium-albuterol  (DUONEB) 0.5-2.5 (3) MG/3ML nebulizer solution 3 mL ( Nebulization Not Given 10/01/24 0937)  ketorolac  (TORADOL ) 15 MG/ML injection 15 mg (15 mg Intravenous Given 10/01/24 1048)  ondansetron  (ZOFRAN ) injection 4 mg (4 mg Intravenous Given 10/01/24 1047)  benzonatate  (TESSALON )  capsule 100 mg (100 mg Oral Given 10/01/24 1051)  methylPREDNISolone  sodium succinate (SOLU-MEDROL ) 125 mg/2 mL injection 125 mg (125 mg Intravenous Given 10/01/24 1049)  HYDROcodone -acetaminophen  (NORCO/VICODIN) 5-325 MG per tablet 1 tablet (1 tablet Oral Given 10/01/24 1258)    Clinical Course as of 10/01/24 1324  Tue Oct 01, 2024  1127 WBC(!): 12.9 [CA]  1127 D-Dimer, Quant: 0.46 [CA]  1224 WBC(!): 12.9 [CA]  1252 Reassessed patient at bedside.  Patient still with significant amount of right sided rib pain.  Lidoderm  patch placed.  Hydrocodone  given. [CA]    Clinical Course User Index [CA] Lorelle Aleck BROCKS, PA-C                                 Medical Decision Making Amount and/or Complexity of Data Reviewed External Data Reviewed: notes.    Details: Pulmonology  notes Labs: ordered. Decision-making details documented in ED Course. Radiology: ordered and independent interpretation performed. Decision-making details documented in ED Course. ECG/medicine tests: ordered and independent interpretation performed. Decision-making details documented in ED Course.  Risk Prescription drug management.   This patient presents to the ED for concern of SOB/CP, this involves an extensive number of treatment options, and is a complaint that carries with it a high risk of complications and morbidity.  The differential diagnosis includes ACS, PE, COPD/asthma exacerbation, PNA, viral process, etc  53 year old female presents to the ED due to ongoing shortness of breath associated with pleuritic chest pain.  No history of blood clots, recent surgeries, recent long immobilizations, hormonal treatments, or lower extremity edema.  Follows pulmonology for shortness of breath which is thought to be secondary to black mold per patient.  Upon arrival, vitals all within normal limits.  O2 saturation 100% on room air.  Patient does have tenderness throughout right side of ribs without crepitus or deformity.   Lungs without wheeze or stridor; however received duoneb treatment prior to evaluation.  Abdomen soft, nondistended with right upper quadrant tenderness.  Routine labs ordered.  Troponin rule out ACS.  D-dimer to rule out PE.  Given tenderness on exam will obtain right upper quadrant ultrasound rule out acute cholecystitis.  X-ray ordered to rule out pneumonia.  IV Toradol  and Zofran  given. Solumedrol  given.   CBC with slight leukocytosis of 12.9.  Normal hemoglobin.  CMP unremarkable.  Normal renal function.  No major electrolyte derangements.  Lipase normal.  Dimer normal.  Low suspicion for PE.  Troponin normal.  EKG normal sinus rhythm.  No signs of acute ischemia.  Low suspicion for ACS.  Right upper quadrant ultrasound personally reviewed and interpreted which is negative for any acute abnormalities.  No evidence of acute cholecystitis. CXR negative for signs of pneumonia.  Upon reassessment, patient admits to improvement in pain after toradol . Possible MSK etiology given reproducible nature on exam.  Low suspicion for aortic dissection.  Will treat patient symptomatically at this time.  Patient able to ambulate and maintain O2 saturation above 95% without difficulty. Patient stable for discharge. Strict ED precautions discussed with patient. Patient states understanding and agrees to plan. Patient discharged home in no acute distress and stable vitals  Co morbidities that complicate the patient evaluation  Fibromyalgia, COPD/asthma Cardiac Monitoring: / EKG:  The patient was maintained on a cardiac monitor.  I personally viewed and interpreted the cardiac monitored which showed an underlying rhythm of: NSR  Social Determinants of Health:  Lives independently   Test / Admission - Considered:  Considered admission however, patient's pain controlled in the ED. No signs of respiratory distress.  Cardiac labs normal.  Patient stable for discharge.      Final diagnoses:  Rib pain on right  side  Shortness of breath    ED Discharge Orders          Ordered    naproxen  (NAPROSYN ) 500 MG tablet  2 times daily        10/01/24 1322    lidocaine  (LIDODERM ) 5 %  Every 24 hours        10/01/24 1322               Lorelle Aleck JAYSON DEVONNA 10/01/24 1324    Geraldene Hamilton, MD 10/03/24 1024

## 2024-10-01 NOTE — ED Notes (Signed)
 Reviewed discharge instructions, medications, and home care with pt. Pt verbalized understanding and had no further questions. Pt exited ED without complications.

## 2024-10-02 ENCOUNTER — Ambulatory Visit: Payer: Self-pay | Admitting: *Deleted

## 2024-10-02 NOTE — Telephone Encounter (Signed)
    FYI Only or Action Required?: Action required by provider: update on patient condition and requesting different medication .  Patient was last seen in primary care on na.  Called Nurse Triage reporting Nausea.  Symptoms began yesterday.  Interventions attempted: Prescription medications: naproxen , zofran  with no relief and Rest, hydration, or home remedies.  Symptoms are: gradually worsening.  Triage Disposition: Call PCP Within 24 Hours  Patient/caregiver understands and will follow disposition?: Unsure    Patient does not have PCP. Recommended patient return to ED for request and change in medication if not working. Patient reports she may try telehealth visit. Recommended if sx worsen go to ED.             Copied from CRM [id number]. Topic: Clinical - Red Word Triage >> Oct 02, 2024  2:32 PM Essie A wrote: Red Word that prompted transfer to Nurse Triage: Nauseated from pain in her kidneys since this morning.  Took medication for nausea but hasn't helped. Reason for Disposition  Taking prescription medication that could cause nausea (e.g., narcotics/opiates, antibiotics, OCPs, many others)  Answer Assessment - Initial Assessment Questions Last ED yesterday. Recommended patient will probably need to revisist ED due to request for different medication. Reports naproxen  could be causing nausea. Denies chest pain no difficulty breathing no fever reported. Offered to schedule PCP. None listed at this time. Patient reports she may try to schedule telehealth visit. Recommended hydration , electrolytes, bland meals.       1. NAUSEA SEVERITY: How bad is the nausea? (e.g., mild, moderate, severe; dehydration, weight loss)     Worsening and zofran  not helping . Took phenergan  and did not help 2. ONSET: When did the nausea begin?    Since yesterday  3. VOMITING: Any vomiting? If Yes, ask: How many times today?     Yesterday none today  4. RECURRENT SYMPTOM: Have  you had nausea before? If Yes, ask: When was the last time? What happened that time?     Yes  5. CAUSE: What do you think is causing the nausea?     Medication naproxen  6. PREGNANCY: Is there any chance you are pregnant? (e.g., unprotected intercourse, missed birth control pill, broken condom)     na  Protocols used: Nausea-A-AH

## 2024-10-04 ENCOUNTER — Telehealth: Payer: Self-pay

## 2024-10-04 DIAGNOSIS — Z0289 Encounter for other administrative examinations: Secondary | ICD-10-CM

## 2024-10-04 NOTE — Telephone Encounter (Signed)
 Shelby, please post payment to patient's account.

## 2024-10-04 NOTE — Telephone Encounter (Signed)
 Received FMLA forms on pt   Called and spoke with pt so see what are her expectations when it comes to these forms  She states she misses approx 2 days of work per month due to SOB  She also mentioned her hypertension, and is currently looking for a new PCP Forms were filled out but never went through proper channels and therefore routing to admin pool to collect payment  I scheduled her for ov with Dr Zaida for 10/21/24.

## 2024-10-16 NOTE — Telephone Encounter (Signed)
 Forms returned to the front and faxed off. Waiting for pick up and payment for appointment day December 8th.

## 2024-10-17 NOTE — Telephone Encounter (Signed)
 NFN

## 2024-10-21 ENCOUNTER — Encounter (HOSPITAL_BASED_OUTPATIENT_CLINIC_OR_DEPARTMENT_OTHER): Payer: Self-pay | Admitting: Emergency Medicine

## 2024-10-21 ENCOUNTER — Ambulatory Visit

## 2024-10-21 ENCOUNTER — Other Ambulatory Visit: Payer: Self-pay

## 2024-10-21 ENCOUNTER — Emergency Department (HOSPITAL_BASED_OUTPATIENT_CLINIC_OR_DEPARTMENT_OTHER)
Admission: EM | Admit: 2024-10-21 | Discharge: 2024-10-21 | Disposition: A | Discharge status: Home / Self Care | Attending: Emergency Medicine | Admitting: Emergency Medicine

## 2024-10-21 VITALS — BP 122/82 | HR 79 | Temp 98.3°F | Ht 64.0 in | Wt 196.0 lb

## 2024-10-21 DIAGNOSIS — M5431 Sciatica, right side: Secondary | ICD-10-CM

## 2024-10-21 DIAGNOSIS — J45909 Unspecified asthma, uncomplicated: Secondary | ICD-10-CM

## 2024-10-21 DIAGNOSIS — Z87891 Personal history of nicotine dependence: Secondary | ICD-10-CM

## 2024-10-21 DIAGNOSIS — I10 Essential (primary) hypertension: Secondary | ICD-10-CM

## 2024-10-21 DIAGNOSIS — S76011A Strain of muscle, fascia and tendon of right hip, initial encounter: Secondary | ICD-10-CM

## 2024-10-21 DIAGNOSIS — R053 Chronic cough: Secondary | ICD-10-CM

## 2024-10-21 MED ORDER — KETOROLAC TROMETHAMINE 30 MG/ML IJ SOLN
30.0000 mg | Freq: Once | INTRAMUSCULAR | Status: AC
  Administered 2024-10-21: 30 mg via INTRAMUSCULAR
  Filled 2024-10-21: qty 1

## 2024-10-21 MED ORDER — LIDOCAINE 5 % EX PTCH: 1.0000 | MEDICATED_PATCH | CUTANEOUS | 0 refills | Status: AC

## 2024-10-21 MED ORDER — CYCLOBENZAPRINE HCL 10 MG PO TABS: 5.0000 mg | ORAL_TABLET | Freq: Every evening | ORAL | 0 refills | Status: AC | PRN

## 2024-10-21 MED ORDER — AMLODIPINE BESYLATE 5 MG PO TABS: 5.0000 mg | ORAL_TABLET | Freq: Every day | ORAL | 11 refills | Status: AC

## 2024-10-21 MED ORDER — BENZONATATE 200 MG PO CAPS: 200.0000 mg | ORAL_CAPSULE | Freq: Three times a day (TID) | ORAL | 3 refills | Status: AC | PRN

## 2024-10-21 NOTE — Patient Instructions (Signed)
  VISIT SUMMARY: Today, we discussed your persistent respiratory symptoms and possible environmental exposure concerns. We reviewed your asthma management and made some changes to your medication to help with your chronic cough and hypertension.  YOUR PLAN: -ASTHMA WITH CHRONIC COUGH: Your chronic cough with phlegm may be worsened by environmental factors like mold. We will continue your current asthma medications, including the albuterol  inhaler, Pulmicort  nebulizer, and albuterol  solution for the nebulizer. We have also ordered blood tests to check for any signs of inflammation or infection and prescribed a stronger cough suppressant, Tessalon  Perles.  -HYPERTENSION: Hypertension, or high blood pressure, can sometimes be worsened by certain medications. We have stopped your lisinopril , as it may be contributing to your cough, and started you on amlodipine  instead.  INSTRUCTIONS: Please continue using your albuterol  inhaler and nebulizer as directed. Take the new medication, amlodipine , as prescribed. We will follow up with the results of your blood tests. If your symptoms persist or worsen, please contact our office.        Contains text generated by [employer].

## 2024-10-21 NOTE — Discharge Instructions (Addendum)
 As discussed, I suspect that your pain is secondary to an irritated nerve in your back that is causing the pain in your right leg.  Please engage in light physical activity (like walking) to prevent your back pain from worsening and to prevent stiffness. Refrain from bedrest which can make your pain worse.   You may use up to 600mg  ibuprofen  every 6 hours as needed for pain.  Do not exceed 2.4g of ibuprofen  per day.  You were given a dose of the similar medication here today.  Your next dose can be no sooner than midnight tonight.    You may take up to 1000mg  of tylenol  every 6 hours as needed for pain.  Do not take more then 4g per day.    You may use a heating pack on your back to help with the pain.  You have been prescribed a muscle relaxer called Flexeril  (cyclobenzaprine ). You may take 0.5 - 1 tablet (5-10mg ) before bed as needed for muscle pain. This medication can be sedating. Do not drive or operate heavy machinery after taking this medicine. Do not drink alcohol or take other sedating medications when taking this medicine for safety reasons.  Keep this out of reach of small children.    You have been prescribed lidocaine  patched to help with pain. You may apply one patch to your back for up to 12 hours at a time. Then, you must remove the patch for a full 12 hours before re-applying a new patch.   I have ordered an outpatient ultrasound for you to evaluate for any possible blood clots in your right leg please be on the look out for a call tomorrow to schedule this appointment.   Please contact a physical therapy office of your choice for an appointment for further treatment of your pain.  Return to the ER if you have loss of bowel or bladder control, you develop fever, you have numbness in your groin, or if you have any other new or concerning symptoms.
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
## 2024-10-21 NOTE — ED Triage Notes (Signed)
 Pt caox4 ambulatory c/o lower back pain radiating to R hip with numbness in R toes since 11/30. Pt states she was seen for the same s/s in Feb.

## 2024-10-21 NOTE — ED Notes (Signed)
 DC paperwork given and verbally understood.

## 2024-10-21 NOTE — ED Provider Notes (Signed)
 [place] EMERGENCY DEPARTMENT AT [facility] Provider Note   CSN: [id number] Arrival date & time: 10/21/24  1538     Patient presents with: Back Pain   Autumn Gordon is a 53 y.o. female with history of sciatica, COPD, hypertension, presents with concern for pain in her right leg that started on 10/13/2024.  Reports the pain starts in her lower back and radiates down into the right leg.  She does report some pins-and-needles sensation in her right leg.  Reports pain worsens with movement.  She states this feels the same as her sciatica flareup in February of this year.  She has been on prednisone  for a cough recently, and notes this did not help with her symptoms.  She also reports some swelling in the right hip that seem to come on with the right leg pain.  Denies any skin change in this area.  Denies any injury to her right leg.  No saddle anesthesia, bladder or bladder incontinence or urinary retention.     Back Pain      Prior to Admission medications   Medication Sig Start Date End Date Taking? Authorizing Provider  cyclobenzaprine  (FLEXERIL ) 10 MG tablet Take 0.5-1 tablets (5-10 mg total) by mouth at bedtime as needed for muscle spasms. 10/21/24  Yes Veta Palma, PA-C  lidocaine  (LIDODERM ) 5 % Place 1 patch onto the skin daily. Remove & Discard patch within 12 hours or as directed by MD 10/21/24  Yes Veta Palma, PA-C  albuterol  (VENTOLIN  HFA) 108 (90 Base) MCG/ACT inhaler Inhale 2 puffs into the lungs every 6 (six) hours as needed for wheezing or shortness of breath. 07/11/24   Hattar, Zola SAILOR, MD  amLODipine  (NORVASC ) 5 MG tablet Take 1 tablet (5 mg total) by mouth daily. 10/21/24   Hattar, Zola SAILOR, MD  azithromycin  (ZITHROMAX ) 250 MG tablet 2 tabs today, then 1 tab daily on days 2-5 09/28/24   Alghanim, Fahid, MD  benzonatate  (TESSALON ) 200 MG capsule Take 1 capsule (200 mg total) by mouth 3 (three) times daily as needed for cough. 10/21/24   Hattar,  Zola SAILOR, MD  budesonide  (PULMICORT ) 0.5 MG/2ML nebulizer solution Take 2 mLs (0.5 mg total) by nebulization daily. 09/02/24   Hattar, Zola SAILOR, MD  fluticasone  (FLONASE ) 50 MCG/ACT nasal spray Place 2 sprays into both nostrils daily. 07/11/24   Hattar, Zola SAILOR, MD  ibuprofen  (ADVIL ) 200 MG tablet Take 200 mg by mouth every 4 (four) hours as needed for mild pain.    [provider]  ipratropium-albuterol  (DUONEB) 0.5-2.5 (3) MG/3ML SOLN Take 3 mLs by nebulization every 6 (six) hours as needed. 08/28/24   Hattar, Zola SAILOR, MD  naproxen  (NAPROSYN ) 500 MG tablet Take 1 tablet (500 mg total) by mouth 2 (two) times daily. 10/01/24   Lorelle Aleck BROCKS, PA-C  nicotine  (NICODERM CQ  - DOSED IN MG/24 HOURS) 14 mg/24hr patch Place 14 mg onto the skin daily.    [provider]  ondansetron  (ZOFRAN -ODT) 4 MG disintegrating tablet Take 1 tablet (4 mg total) by mouth every 8 (eight) hours as needed for nausea or vomiting. 10/01/24   Lorelle Aleck BROCKS, PA-C    Allergies: Shellfish allergy  and Tramadol    Review of Systems  Musculoskeletal:  Positive for back pain.    Updated Vital Signs BP (!) 148/91 (BP Location: Right Arm)   Pulse 75   Temp 98.6 F (37 C) (Oral)   Resp 16   Ht 5' 4 (1.626 m)   Wt  88.9 kg   LMP 08/13/2024 (Exact Date)   SpO2 97%   BMI 33.64 kg/m   Physical Exam Vitals and nursing note reviewed.  Constitutional:      General: She is not in acute distress.    Appearance: She is well-developed.  HENT:     Head: Normocephalic and atraumatic.  Eyes:     Conjunctiva/sclera: Conjunctivae normal.  Cardiovascular:     Rate and Rhythm: Normal rate and regular rhythm.     Heart sounds: No murmur heard.    Comments: Pedal pulse 2+ bilaterally Pulmonary:     Effort: Pulmonary effort is normal. No respiratory distress.     Breath sounds: Normal breath sounds.  Abdominal:     Palpations: Abdomen is soft.     Tenderness: There is no abdominal tenderness.   Musculoskeletal:        General: No swelling.     Cervical back: Neck supple.     Comments: No tenderness to palpation of the cervical, thoracic, lumbar spine.  Tender to the right gluteal musculature which does reproduce some of patient's pain.  No bony point tenderness to palpation of the right side of the pelvis, femur, patella, tibia, or fibula. No right calf edema or tenderness to palpation  Able to ambulate, but with mild antalgic gait due to pain of the right leg.  She has full range of motion of the bilateral lower extremities.  Skin:    General: Skin is warm and dry.     Capillary Refill: Capillary refill takes less than 2 seconds.     Comments: No wounds or erythema of the skin of the right lower extremity  Neurological:     Mental Status: She is alert.     Comments: 5/5 strength with resisted hip flexion and extension, knee flexion extension, ankle plantarflexion and dorsiflexion bilaterally  Reports a pins-and-needles sensation in her right foot diffusely, but still able to feel touch.  Normal sensation in the left lower extremity.  Psychiatric:        Mood and Affect: Mood normal.     (all labs ordered are listed, but only abnormal results are displayed) Labs Reviewed - No data to display  EKG: None  Radiology: No results found.   Procedures   Medications Ordered in the ED  ketorolac  (TORADOL ) 30 MG/ML injection 30 mg (30 mg Intramuscular Given 10/21/24 1742)                                    Medical Decision Making Risk Prescription drug management.     Differential diagnosis includes but is not limited to radiculopathy, herniated nucleus pulposus, spinal epidural abscess, spinal malignancy, muscle strain, AVN, DVT  ED Course:  Upon initial evaluation, patient is well-appearing.  Reporting pain in the right lower extremity.  On exam, she has significant tenderness to the right gluteal musculature which does reproduce some of her pain.  She reports  that the pain does radiate down to her right leg.  She denies any known injury.  Does not have any bony tenderness of the cervical, thoracic, lumbar spine, pelvis, femur, tibia or fibula, doubt any fracture, dislocation, or avascular necrosis.  No negation for x-ray imaging.  Patient has 5/5 strength throughout the left and right lower extremity.  However, she does report a pins and needle sensation in her right lower extremity.  This has been ongoing for about 1 week,  no other neurologic deficits, no concern for CVA at this time.  She does not have any red flag signs such as bowel or bladder incontinence or retention, saddle anesthesia, fever, IVDU, history of malignancy, or known injury to warrant imaging.  She did have an MRI of her lumbar spine obtained when symptoms first started in February which showed degenerative disc changes. Do not feel she needs re-imaging given no red flag symptoms and symptoms consistent with previous sciatica flare.   Her exam today is consistent with a sciatica flare.  She has been on a couple courses of prednisone  recently for cough, do not want to extend the course of prednisone  since she has been on it for the last couple weeks.  We will attempt therapy with NSAIDs at home as well as lidocaine  patches and Flexeril .  Although I do not appreciate any noticeable swelling in the right hip on her exam, patient notes increased swelling to the right hip.  We will obtain outpatient DVT study to rule out DVT as we do not have ultrasound available currently.  Low clinical concern for DVT at this time given no other swelling in the right lower extremity, 2+ pedal pulses, will not start patient on any anticoagulation at this time.  Patient stable and appropriate for discharge home at this time   Medications Given: Toradol    Impression: Right lower extremity sciatica  Disposition:  The patient was discharged home with instructions to take Tylenol  and ibuprofen  as needed for  pain.  Flexeril  before bed as needed for pain.  She understands that the Flexeril  may make her drowsy and do not drink alcohol or drive after taking this medication.  Lidocaine  patches as needed for pain.  Follow-up with PCP within the next week for recheck of symptoms.  I discussed with patient that she may benefit from physical therapy, and she will contact a physical therapy office of her choice for further treatment.  She understands that she will be contacted tomorrow to schedule a ultrasound of her right leg to rule out DVT. Return precautions given and patient verbalized understanding.    Record Review: External records from outside source obtained and reviewed including ER visit and MRI imaging from February 2025 for her back pain and right leg pain     This chart was dictated using voice recognition software, Dragon. Despite the best efforts of this provider to proofread and correct errors, errors may still occur which can change documentation meaning.       Final diagnoses:  Right sided sciatica  Muscle strain of right gluteal region, initial encounter    ED Discharge Orders          Ordered    US  Venous Img Lower Unilateral Right        10/21/24 1753    cyclobenzaprine  (FLEXERIL ) 10 MG tablet  At bedtime PRN        10/21/24 1756    lidocaine  (LIDODERM ) 5 %  Every 24 hours        10/21/24 1756               Veta Palma, PA-C 10/21/24 1823    Freddi Hamilton, MD 10/21/24 2006
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
## 2024-10-21 NOTE — ED Notes (Addendum)
 Radiology notified of the Pts need for a radiology appointment... They requested a facesheet and they would call her tomorrow.[email or web address]

## 2024-10-22 ENCOUNTER — Telehealth: Payer: Self-pay

## 2024-10-22 LAB — CBC WITH DIFFERENTIAL/PLATELET
Basophils Absolute: 0 K/uL (ref 0.0–0.1)
Basophils Relative: 0.6 % (ref 0.0–3.0)
Eosinophils Absolute: 0.6 K/uL (ref 0.0–0.7)
Eosinophils Relative: 10.7 % — ABNORMAL HIGH (ref 0.0–5.0)
HCT: 41.6 % (ref 36.0–46.0)
Hemoglobin: 14.1 g/dL (ref 12.0–15.0)
Lymphocytes Relative: 40.1 % (ref 12.0–46.0)
Lymphs Abs: 2.2 K/uL (ref 0.7–4.0)
MCHC: 33.8 g/dL (ref 30.0–36.0)
MCV: 85.1 fl (ref 78.0–100.0)
Monocytes Absolute: 0.3 K/uL (ref 0.1–1.0)
Monocytes Relative: 6.2 % (ref 3.0–12.0)
Neutro Abs: 2.3 K/uL (ref 1.4–7.7)
Neutrophils Relative %: 42.4 % — ABNORMAL LOW (ref 43.0–77.0)
Platelets: 380 K/uL (ref 150.0–400.0)
RBC: 4.89 Mil/uL (ref 3.87–5.11)
RDW: 15.1 % (ref 11.5–15.5)
WBC: 5.4 K/uL (ref 4.0–10.5)

## 2024-10-22 LAB — C-REACTIVE PROTEIN: CRP: <0.5 mg/dL (ref 0.5–20.0)

## 2024-10-22 LAB — SEDIMENTATION RATE: Sed Rate: 35 mm/h — ABNORMAL HIGH (ref 0–30)

## 2024-10-22 NOTE — Telephone Encounter (Signed)
 Copied from CRM #[id number]. Topic: Medical Record Request - Records Request >> Oct 22, 2024 12:51 PM Devaughn RAMAN wrote: Reason for CRM: Pt called regarding her intermittent FMLA paperwork and to check the status regarding if it is completed or not.   This has already been handled, NFN.

## 2024-10-22 NOTE — Telephone Encounter (Signed)
 Copied from CRM [id number]. Topic: Clinical - Lab/Test Results >> Oct 22, 2024 12:53 PM Devaughn RAMAN wrote: Reason for CRM: Pt called regarding lab results.  Advised pt that lab results are not yet ready. Pt verbalized understanding.  Pt also asked for update on FMLA paperwork. Per phone encounter on 10/04/24:  Forms returned to the front and faxed off. Waiting for pick up and payment for appointment day December 8th.      Pt verbalized understanding, NFN.

## 2024-10-23 ENCOUNTER — Telehealth: Admitting: Physician Assistant

## 2024-10-23 ENCOUNTER — Telehealth: Payer: Self-pay

## 2024-10-23 ENCOUNTER — Ambulatory Visit (HOSPITAL_BASED_OUTPATIENT_CLINIC_OR_DEPARTMENT_OTHER): Admission: RE | Admit: 2024-10-23 | Discharge: 2024-10-23 | Attending: Emergency Medicine

## 2024-10-23 DIAGNOSIS — M25451 Effusion, right hip: Secondary | ICD-10-CM | POA: Insufficient documentation

## 2024-10-23 DIAGNOSIS — J309 Allergic rhinitis, unspecified: Secondary | ICD-10-CM

## 2024-10-23 DIAGNOSIS — M79604 Pain in right leg: Secondary | ICD-10-CM | POA: Insufficient documentation

## 2024-10-23 MED ORDER — PREDNISONE 20 MG PO TABS: 20.0000 mg | ORAL_TABLET | Freq: Two times a day (BID) | ORAL | 0 refills | Status: AC

## 2024-10-23 NOTE — Progress Notes (Signed)
 E visit for Allergic Rhinitis We are sorry that you are not feeling well.  Here is how we plan to help!  Based on what you have shared with me it looks like you have Allergic Rhinitis.  Rhinitis is when a reaction occurs that causes nasal congestion, runny nose, sneezing, and itching.  Most types of rhinitis are caused by an inflammation and are associated with symptoms in the eyes ears or throat. There are several types of rhinitis.  The most common are acute rhinitis, which is usually caused by a viral illness, allergic or seasonal rhinitis, and nonallergic or year-round rhinitis.  Nasal allergies occur certain times of the year.  Allergic rhinitis is caused when allergens in the air trigger the release of histamine in the body.  Histamine causes itching, swelling, and fluid to build up in the fragile linings of the nasal passages, sinuses and eyelids.  An itchy nose and clear discharge are common.  I recommend the following over the counter treatments: You should take a daily dose of antihistamine  I also would recommend a nasal spray: Flonase  2 sprays into each nostril once daily  You may also benefit from eye drops such as: Visine 1-2 drops each eye twice daily as needed  I will also send prednisone .   HOME CARE:  You can use an over-the-counter saline nasal spray as needed Avoid areas where there is heavy dust, mites, or molds Stay indoors on windy days during the pollen season Keep windows closed in home, at least in bedroom; use air conditioner. Use high-efficiency house air filter Keep windows closed in car, turn AC on re-circulate Avoid playing out with dog during pollen season  GET HELP RIGHT AWAY IF:  If your symptoms do not improve within 10 days You become short of breath You develop yellow or green discharge from your nose for over 3 days You have coughing fits  MAKE SURE YOU:  Understand these instructions Will watch your condition Will get help right away if you  are not doing well or get worse  Thank you for choosing an e-visit. Your e-visit answers were reviewed by a board certified advanced clinical practitioner to complete your personal care plan. Depending upon the condition, your plan could have included both over the counter or prescription medications. Please review your pharmacy choice. Be sure that the pharmacy you have chosen is open so that you can pick up your prescription now.  If there is a problem you may message your provider in MyChart to have the prescription routed to another pharmacy. Your safety is important to us . If you have drug allergies check your prescription carefully.  For the next 24 hours, you can use MyChart to ask questions about todays visit, request a non-urgent call back, or ask for a work or school excuse from your e-visit provider. You will get an email in the next two days asking about your experience. I hope that your e-visit has been valuable and will speed your recovery.   I have spent 5 minutes in review of e-visit questionnaire, review and updating patient chart, medical decision making and response to patient.   Haydan Mansouri, FNP

## 2024-10-23 NOTE — Telephone Encounter (Signed)
 Copied from CRM #[id number]. Topic: Clinical - Medical Advice >> Oct 23, 2024  2:45 PM Devaughn RAMAN wrote: Reason for CRM: Pt stated she is coughing through the night and exhausted and she would like to know if she can get a medication prescribed and the increased dosage for the medication is not helping and she needs assistance managing her cough through the night.   Pt states she has been unable to sleep due to her cough, sometimes coughs up clear phlegm. Pt states she is using her nebulizers and Tessalon  but she is still coughing a lot.  Pt is requesting cough syrup.  Dr. Zaida, please advise. Thank you!

## 2024-11-29 ENCOUNTER — Encounter: Payer: Self-pay | Admitting: *Deleted

## 2024-12-01 ENCOUNTER — Emergency Department (HOSPITAL_BASED_OUTPATIENT_CLINIC_OR_DEPARTMENT_OTHER): Admission: EM | Admit: 2024-12-01 | Discharge: 2024-12-01 | Disposition: A | Discharge status: Home / Self Care

## 2024-12-01 ENCOUNTER — Encounter (HOSPITAL_BASED_OUTPATIENT_CLINIC_OR_DEPARTMENT_OTHER): Payer: Self-pay

## 2024-12-01 ENCOUNTER — Other Ambulatory Visit: Payer: Self-pay

## 2024-12-01 ENCOUNTER — Emergency Department (HOSPITAL_BASED_OUTPATIENT_CLINIC_OR_DEPARTMENT_OTHER)

## 2024-12-01 DIAGNOSIS — I10 Essential (primary) hypertension: Secondary | ICD-10-CM | POA: Insufficient documentation

## 2024-12-01 DIAGNOSIS — K047 Periapical abscess without sinus: Secondary | ICD-10-CM | POA: Insufficient documentation

## 2024-12-01 DIAGNOSIS — Z79899 Other long term (current) drug therapy: Secondary | ICD-10-CM | POA: Insufficient documentation

## 2024-12-01 DIAGNOSIS — J45909 Unspecified asthma, uncomplicated: Secondary | ICD-10-CM | POA: Diagnosis not present

## 2024-12-01 DIAGNOSIS — H9202 Otalgia, left ear: Secondary | ICD-10-CM | POA: Diagnosis not present

## 2024-12-01 DIAGNOSIS — R220 Localized swelling, mass and lump, head: Secondary | ICD-10-CM | POA: Diagnosis present

## 2024-12-01 LAB — CBC WITH DIFFERENTIAL/PLATELET
Abs Immature Granulocytes: 0.02 K/uL (ref 0.00–0.07)
Basophils Absolute: 0 K/uL (ref 0.0–0.1)
Basophils Relative: 0 %
Eosinophils Absolute: 0.4 K/uL (ref 0.0–0.5)
Eosinophils Relative: 5 %
HCT: 39.8 % (ref 36.0–46.0)
Hemoglobin: 13.5 g/dL (ref 12.0–15.0)
Immature Granulocytes: 0 %
Lymphocytes Relative: 30 %
Lymphs Abs: 2.6 K/uL (ref 0.7–4.0)
MCH: 28.4 pg (ref 26.0–34.0)
MCHC: 33.9 g/dL (ref 30.0–36.0)
MCV: 83.6 fL (ref 80.0–100.0)
Monocytes Absolute: 0.6 K/uL (ref 0.1–1.0)
Monocytes Relative: 7 %
Neutro Abs: 5 K/uL (ref 1.7–7.7)
Neutrophils Relative %: 58 %
Platelets: 373 K/uL (ref 150–400)
RBC: 4.76 MIL/uL (ref 3.87–5.11)
RDW: 14.1 % (ref 11.5–15.5)
WBC: 8.6 K/uL (ref 4.0–10.5)
nRBC: 0 % (ref 0.0–0.2)

## 2024-12-01 LAB — BASIC METABOLIC PANEL WITH GFR
Anion gap: 12 (ref 5–15)
BUN: 7 mg/dL (ref 6–20)
CO2: 25 mmol/L (ref 22–32)
Calcium: 9.5 mg/dL (ref 8.9–10.3)
Chloride: 103 mmol/L (ref 98–111)
Creatinine, Ser: 0.67 mg/dL (ref 0.44–1.00)
GFR, Estimated: >60 mL/min
Glucose, Bld: 80 mg/dL (ref 70–99)
Potassium: 3.5 mmol/L (ref 3.5–5.1)
Sodium: 140 mmol/L (ref 135–145)

## 2024-12-01 MED ORDER — AMOXICILLIN-POT CLAVULANATE 875-125 MG PO TABS: 1.0000 | ORAL_TABLET | Freq: Two times a day (BID) | ORAL | 0 refills | Status: AC

## 2024-12-01 MED ORDER — HYDROMORPHONE HCL 1 MG/ML IJ SOLN
0.5000 mg | Freq: Once | INTRAMUSCULAR | Status: AC
  Administered 2024-12-01: 0.5 mg via INTRAVENOUS
  Filled 2024-12-01: qty 1

## 2024-12-01 MED ORDER — KETOROLAC TROMETHAMINE 10 MG PO TABS: 10.0000 mg | ORAL_TABLET | Freq: Four times a day (QID) | ORAL | 0 refills | Status: AC | PRN

## 2024-12-01 MED ORDER — ONDANSETRON HCL 4 MG/2ML IJ SOLN
4.0000 mg | Freq: Once | INTRAMUSCULAR | Status: AC
  Administered 2024-12-01: 4 mg via INTRAVENOUS
  Filled 2024-12-01: qty 2

## 2024-12-01 MED ORDER — HYDROCODONE-ACETAMINOPHEN 5-325 MG PO TABS: 1.0000 | ORAL_TABLET | Freq: Four times a day (QID) | ORAL | 0 refills | Status: AC | PRN

## 2024-12-01 MED ORDER — IOHEXOL 300 MG/ML  SOLN
75.0000 mL | Freq: Once | INTRAMUSCULAR | Status: AC | PRN
  Administered 2024-12-01: 75 mL via INTRAVENOUS

## 2024-12-01 MED ORDER — AMOXICILLIN-POT CLAVULANATE 875-125 MG PO TABS
1.0000 | ORAL_TABLET | Freq: Once | ORAL | Status: AC
  Administered 2024-12-01: 1 via ORAL
  Filled 2024-12-01: qty 1

## 2024-12-01 MED ORDER — MORPHINE SULFATE (PF) 4 MG/ML IV SOLN
4.0000 mg | Freq: Once | INTRAVENOUS | Status: AC
  Administered 2024-12-01: 4 mg via INTRAVENOUS
  Filled 2024-12-01: qty 1

## 2024-12-01 NOTE — Progress Notes (Unsigned)
" °  Cardiology Office Note:  .   Date:  12/01/2024  ID:  [name], DOB [birth year], MRN [id number] PCP: Pcp, No  [facility] Providers Cardiologist:  None { Click to update primary MD,subspecialty MD or APP then REFRESH:1}   History of Present Illness: .    Autumn Gordon is a 54 y.o. female ***  Discussed the use of AI scribe software for clinical note transcription with the patient, who gave verbal consent to proceed.  History of Present Illness       ROS: Remaining review of systems negative  Studies Reviewed: .        Results  Risk Assessment/Calculations:   {Does this patient have ATRIAL FIBRILLATION?:[id number]}         Physical Exam:   VS:  There were no vitals taken for this visit.   Wt Readings from Last 3 Encounters:  12/01/24 194 lb 0.1 oz (88 kg)  10/21/24 196 lb (88.9 kg)  10/21/24 196 lb (88.9 kg)    GEN: Well nourished, well developed in no acute distress NECK: No JVD; No carotid bruits CARDIAC: ***RRR, no murmurs, no rubs, no gallops RESPIRATORY:  Clear to auscultation without rales, wheezing or rhonchi  ABDOMEN: Soft, non-tender, non-distended EXTREMITIES:  No edema; No deformity   ASSESSMENT AND PLAN: .    Assessment and Plan Assessment & Plan        {Are you ordering a CV Procedure (e.g. stress test, cath, DCCV, TEE, etc)?   Press F2        :[id number]}    Follow up: ***  Signed, Emeline FORBES Calender, DO  12/01/2024 7:15 PM    [facility] "

## 2024-12-01 NOTE — ED Notes (Signed)
 ED Provider at bedside.

## 2024-12-01 NOTE — ED Provider Notes (Signed)
 " [place] EMERGENCY DEPARTMENT AT [facility] Provider Note   CSN: [id number] Arrival date & time: 12/01/24  1210     Patient presents with: Facial Swelling and Sore Throat   Autumn Gordon is a 54 y.o. female.   54 year old female with past medical history of hypertension and asthma presenting to the emergency department today with right sided facial pain and swelling.  The patient states that this been going on now since yesterday.  Reports the swelling is gotten worse.  She states this mostly over the maxillary region.  She states she is also having some left ear pain as well.  She denies any difficulty breathing or swallowing.   Sore Throat       Prior to Admission medications  Medication Sig Start Date End Date Taking? Authorizing Provider  amoxicillin -clavulanate (AUGMENTIN ) 875-125 MG tablet Take 1 tablet by mouth every 12 (twelve) hours. 12/01/24  Yes Ula Prentice SAUNDERS, MD  HYDROcodone -acetaminophen  (NORCO/VICODIN) 5-325 MG tablet Take 1 tablet by mouth every 6 (six) hours as needed for moderate pain (pain score 4-6) or severe pain (pain score 7-10). 12/01/24  Yes Ula Prentice SAUNDERS, MD  ketorolac  (TORADOL ) 10 MG tablet Take 1 tablet (10 mg total) by mouth every 6 (six) hours as needed. 12/01/24  Yes Ula Prentice SAUNDERS, MD  albuterol  (VENTOLIN  HFA) 108 (90 Base) MCG/ACT inhaler Inhale 2 puffs into the lungs every 6 (six) hours as needed for wheezing or shortness of breath. 07/11/24   Hattar, Zola SAILOR, MD  amLODipine  (NORVASC ) 5 MG tablet Take 1 tablet (5 mg total) by mouth daily. 10/21/24   Hattar, Zola SAILOR, MD  azithromycin  (ZITHROMAX ) 250 MG tablet 2 tabs today, then 1 tab daily on days 2-5 09/28/24   Alghanim, Fahid, MD  benzonatate  (TESSALON ) 200 MG capsule Take 1 capsule (200 mg total) by mouth 3 (three) times daily as needed for cough. 10/21/24   Hattar, Zola SAILOR, MD  budesonide  (PULMICORT ) 0.5 MG/2ML nebulizer solution Take 2 mLs (0.5 mg total) by nebulization daily. 09/02/24    Hattar, Zola SAILOR, MD  cyclobenzaprine  (FLEXERIL ) 10 MG tablet Take 0.5-1 tablets (5-10 mg total) by mouth at bedtime as needed for muscle spasms. 10/21/24   Franaszek, Amanda, PA-C  fluticasone  (FLONASE ) 50 MCG/ACT nasal spray Place 2 sprays into both nostrils daily. 07/11/24   Hattar, Zola SAILOR, MD  ipratropium-albuterol  (DUONEB) 0.5-2.5 (3) MG/3ML SOLN Take 3 mLs by nebulization every 6 (six) hours as needed. 08/28/24   Hattar, Zola SAILOR, MD  lidocaine  (LIDODERM ) 5 % Place 1 patch onto the skin daily. Remove & Discard patch within 12 hours or as directed by MD 10/21/24   Veta Palma, PA-C  naproxen  (NAPROSYN ) 500 MG tablet Take 1 tablet (500 mg total) by mouth 2 (two) times daily. 10/01/24   Aberman, Caroline C, PA-C  nicotine  (NICODERM CQ  - DOSED IN MG/24 HOURS) 14 mg/24hr patch Place 14 mg onto the skin daily.    [provider]  ondansetron  (ZOFRAN -ODT) 4 MG disintegrating tablet Take 1 tablet (4 mg total) by mouth every 8 (eight) hours as needed for nausea or vomiting. 10/01/24   Lorelle Aleck BROCKS, PA-C    Allergies: Shellfish allergy  and Tramadol    Review of Systems  HENT:  Positive for facial swelling.   All other systems reviewed and are negative.   Updated Vital Signs BP (!) 137/94   Pulse 90   Temp 98.2 F (36.8 C) (Oral)   Resp 16   Ht 5' 4 (  1.626 m)   Wt 88 kg   SpO2 94%   BMI 33.30 kg/m   Physical Exam Vitals and nursing note reviewed.   Gen: NAD Eyes: PERRL, EOMI HEENT: no oropharyngeal swelling, the patient does have significant swelling to the right side of her face, she does have poor dentition and has multiple missing teeth.  There is incisor on the right with perhaps some mild surrounding gingival erythema but no palpable abscess Neck: trachea midline Resp: clear to auscultation bilaterally Card: RRR, no murmurs, rubs, or gallops Abd: nontender, nondistended Extremities: no calf tenderness, no edema Vascular: 2+ radial pulses bilaterally, 2+ DP  pulses bilaterally Skin: no rashes Psyc: acting appropriately   (all labs ordered are listed, but only abnormal results are displayed) Labs Reviewed  CBC WITH DIFFERENTIAL/PLATELET  BASIC METABOLIC PANEL WITH GFR    EKG: None  Radiology: CT SOFT TISSUE NECK W CONTRAST Result Date: 12/01/2024 EXAM: CT NECK WITH CONTRAST 12/01/2024 02:04:33 PM TECHNIQUE: CT of the neck was performed with the administration of 75 mL iohexol  (OMNIPAQUE ) 300 MG/ML solution. Multiplanar reformatted images are provided for review. Automated exposure control, iterative reconstruction, and/or weight based adjustment of the mA/kV was utilized to reduce the radiation dose to as low as reasonably achievable. COMPARISON: CT head without contrast 09/19/2023 and CT maxillofacial 10/28/2017. CLINICAL HISTORY: Facial paralysis/weakness (CN 7); R sided facial swelling. FINDINGS: AERODIGESTIVE TRACT: No discrete mass. No edema. SALIVARY GLANDS: The parotid and submandibular glands are unremarkable. THYROID: Unremarkable. LYMPH NODES: No suspicious cervical lymphadenopathy. SOFT TISSUES: Adjacent soft tissue swelling is present anterior to the maxilla. BONES: Dental caries is present in the right maxillary canine tooth with periapical lucency and anterior bone erosion. OTHER: The superior ostial abscess measures 20 x 9 x 16 mm. Visualized mastoid air cells are well aerated. Visualized lungs are clear. IMPRESSION: 1. Odontogenic infection of the right maxillary canine tooth with dental caries, periapical lucency, and anterior bone erosion. 2. Adjacent anterior maxillary soft tissue swelling with a superior odontogenic subperiosteal abscess measuring 20 x 9 x 16 mm. Electronically signed by: Lonni Necessary MD 12/01/2024 02:48 PM EST RP Workstation: [id number]     Procedures   Medications Ordered in the ED  amoxicillin -clavulanate (AUGMENTIN ) 875-125 MG per tablet 1 tablet (has no administration in time range)  morphine  (PF) 4  MG/ML injection 4 mg (4 mg Intravenous Given 12/01/24 1256)  ondansetron  (ZOFRAN ) injection 4 mg (4 mg Intravenous Given 12/01/24 1255)  HYDROmorphone  (DILAUDID ) injection 0.5 mg (0.5 mg Intravenous Given 12/01/24 1330)  iohexol  (OMNIPAQUE ) 300 MG/ML solution 75 mL (75 mLs Intravenous Contrast Given 12/01/24 1340)                                    Medical Decision Making 53 year old female with past medical history of hypertension and asthma presenting to the emergency department today with right-sided facial swelling.  Will obtain labs here as well as a CT scan of her Nexa facial bones with contrast for further evaluation to evaluate for deep space infection.  Will give the patient pain and nausea medication here and reevaluate for ultimate disposition.    The patient's workup is revealing for a small dental abscess on her CT scan.  She is feeling better on reassessment.  I think that she may be discharged with oral antibiotics.  She does have a dentist that she follows up with.  She is encouraged to call them  tomorrow to schedule follow-up.  She is discharged with return precautions.  Amount and/or Complexity of Data Reviewed Labs: ordered. Radiology: ordered.  Risk Prescription drug management.        Final diagnoses:  Dental abscess    ED Discharge Orders          Ordered    amoxicillin -clavulanate (AUGMENTIN ) 875-125 MG tablet  Every 12 hours        12/01/24 1516    ketorolac  (TORADOL ) 10 MG tablet  Every 6 hours PRN        12/01/24 1516    HYDROcodone -acetaminophen  (NORCO/VICODIN) 5-325 MG tablet  Every 6 hours PRN        12/01/24 1516               Ula Prentice SAUNDERS, MD 12/01/24 1517  "

## 2024-12-01 NOTE — Discharge Instructions (Signed)
 Please take the Augmentin  as prescribed.  Take the naproxen  as needed for pain.  If you still having pain take the Norco.  Do not drive or drink alcohol while taking this as it may make you drowsy.  Please call tomorrow to schedule a follow-up appointment with your dentist.  Return to the ER for worsening symptoms.

## 2024-12-01 NOTE — ED Notes (Signed)
 Patient transported to X-ray

## 2024-12-01 NOTE — ED Triage Notes (Signed)
 Pt reports R sided facial swelling, sore throat, L ear pain starting yesterday.

## 2024-12-02 ENCOUNTER — Ambulatory Visit

## 2024-12-02 ENCOUNTER — Telehealth: Payer: Self-pay | Admitting: *Deleted

## 2024-12-02 ENCOUNTER — Ambulatory Visit: Attending: Internal Medicine | Admitting: Internal Medicine

## 2024-12-02 ENCOUNTER — Encounter: Payer: Self-pay | Admitting: Internal Medicine

## 2024-12-02 VITALS — Ht 65.0 in | Wt 198.2 lb

## 2024-12-02 DIAGNOSIS — E782 Mixed hyperlipidemia: Secondary | ICD-10-CM | POA: Diagnosis not present

## 2024-12-02 DIAGNOSIS — R0609 Other forms of dyspnea: Secondary | ICD-10-CM

## 2024-12-02 DIAGNOSIS — Z77120 Contact with and (suspected) exposure to mold (toxic): Secondary | ICD-10-CM | POA: Diagnosis not present

## 2024-12-02 DIAGNOSIS — R002 Palpitations: Secondary | ICD-10-CM

## 2024-12-02 NOTE — Patient Instructions (Signed)
 Medication Instructions:  Your physician recommends that you continue on your current medications as directed. Please refer to the Current Medication list given to you today.   *If you need a refill on your cardiac medications before your next appointment, please call your pharmacy*  Lab Work: Lipid panel and Lipoprotein A at [facility]  If you have labs (blood work) drawn today and your tests are completely normal, you will receive your results only by: MyChart Message (if you have MyChart) OR A paper copy in the mail If you have any lab test that is abnormal or we need to change your treatment, we will call you to review the results.  Testing/Procedures: Your physician has requested that you have an echocardiogram. Echocardiography is a painless test that uses sound waves to create images of your heart. It provides your doctor with information about the size and shape of your heart and how well your hearts chambers and valves are working. This procedure takes approximately one hour. There are no restrictions for this procedure. Please do NOT wear cologne, perfume, aftershave, or lotions (deodorant is allowed). Please arrive 15 minutes prior to your appointment time.  Please note: We ask at that you not bring children with you during ultrasound (echo/ vascular) testing. Due to room size and safety concerns, children are not allowed in the ultrasound rooms during exams. Our front office staff cannot provide observation of children in our lobby area while testing is being conducted. An adult accompanying a patient to their appointment will only be allowed in the ultrasound room at the discretion of the ultrasound technician under special circumstances. We apologize for any inconvenience.   Your physician has requested that you have en exercise stress myoview. For further information please visit [email or web address] Please follow instructions   You are scheduled for a Myocardial Perfusion  Imaging Study. Please arrive 15 minutes prior to your appointment time for registration and insurance purposes.   The test will take approximately 3 to 4 hours to complete; you may bring reading material.  If someone comes with you to your appointment, they will need to remain in the main lobby due to limited space in the testing area.    How to prepare for your Myocardial Perfusion Test: Do not eat or drink 3 hours prior to your test, except you may have water. Do not consume products containing caffeine (regular or decaffeinated) 12 hours prior to your test. (ex: coffee, chocolate, sodas, tea). Do bring a list of your current medications with you.  If not listed below, you may take your medications as normal. Do wear comfortable clothes (no dresses or overalls) and walking shoes, tennis shoes preferred (No heels or open toe shoes are allowed). Do NOT wear cologne, perfume, aftershave, or lotions (deodorant is allowed). If these instructions are not followed, your test will have to be rescheduled.  If you cannot keep your appointment, please provide 24 hours notification to the Nuclear Lab, to avoid a possible $50 charge to your account.    Your physician has requested that you wear a heart monitor.     Follow-Up: At [facility], you and your health needs are our priority.  As part of our continuing mission to provide you with exceptional heart care, our providers are all part of one team.  This team includes your primary Cardiologist (physician) and Advanced Practice Providers or APPs (Physician Assistants and Nurse Practitioners) who all work together to provide you with the care you need, when you  need it.  Your next appointment:   3 month(s)  Provider:   Emeline FORBES Calender, DO     Other Instructions GEOFFRY HEWS- Long Term Monitor Instructions  Your physician has requested you wear a ZIO patch monitor for 3 days.  This is a single patch monitor. [employer] supplies one patch monitor  per enrollment. Additional stickers are not available. Please do not apply patch if you will be having a Nuclear Stress Test,  Echocardiogram, Cardiac CT, MRI, or Chest Xray during the period you would be wearing the  monitor. The patch cannot be worn during these tests. You cannot remove and re-apply the  ZIO XT patch monitor.  Your ZIO patch monitor will be mailed 3 day USPS to your address on file. It may take 3-5 days  to receive your monitor after you have been enrolled.  Once you have received your monitor, please review the enclosed instructions. Your monitor  has already been registered assigning a specific monitor serial # to you.  Billing and Patient Assistance Program Information  We have supplied [employer] with any of your insurance information on file for billing purposes. [employer] offers a sliding scale Patient Assistance Program for patients that do not have  insurance, or whose insurance does not completely cover the cost of the ZIO monitor.  You must apply for the Patient Assistance Program to qualify for this discounted rate.  To apply, please call [employer] at [phone], select option 4, select option 2, ask to apply for  Patient Assistance Program. Meredeth will ask your household income, and how many people  are in your household. They will quote your out-of-pocket cost based on that information.  [employer] will also be able to set up a 65-month, interest-free payment plan if needed.  Applying the monitor   Shave hair from upper left chest.  Hold abrader disc by orange tab. Rub abrader in 40 strokes over the upper left chest as  indicated in your monitor instructions.  Clean area with 4 enclosed alcohol pads. Let dry.  Apply patch as indicated in monitor instructions. Patch will be placed under collarbone on left  side of chest with arrow pointing upward.  Rub patch adhesive wings for 2 minutes. Remove white label marked 1. Remove the white  label marked 2. Rub patch  adhesive wings for 2 additional minutes.  While looking in a mirror, press and release button in center of patch. A small green light will  flash 3-4 times. This will be your only indicator that the monitor has been turned on.  Do not shower for the first 24 hours. You may shower after the first 24 hours.  Press the button if you feel a symptom. You will hear a small click. Record Date, Time and  Symptom in the Patient Logbook.  When you are ready to remove the patch, follow instructions on the last 2 pages of Patient  Logbook. Stick patch monitor onto the last page of Patient Logbook.  Place Patient Logbook in the blue and white box. Use locking tab on box and tape box closed  securely. The blue and white box has prepaid postage on it. Please place it in the mailbox as  soon as possible. Your physician should have your test results approximately 7 days after the  monitor has been mailed back to [facility].  Call [facility] Customer Care at [phone] if you have questions regarding  your ZIO XT patch monitor. Call them immediately if you see an orange light  blinking on your  monitor.  If your monitor falls off in less than 4 days, contact our Monitor department at [phone].  If your monitor becomes loose or falls off after 4 days call [employer] at [phone] for  suggestions on securing your monitor

## 2024-12-02 NOTE — Telephone Encounter (Signed)
 Copied from CRM #[id number]. Topic: Clinical - Medical Advice >> Nov 28, 2024  9:47 AM Autumn Gordon wrote: Reason for CRM: Patient calling in to get an email address for Dr Zaida.  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 Would like Dr Mallory opinion moving forward.  Please call patient @ [phone].  ------------------------------------------------------------------------------------------------------------------------------------------------  I called and spoke with patient, she is trying to coordinate her care.  She is dealing with her lung problems as well as heart problems she did not know she had.  She is off of the Lisinopril  and the cough is better, she still has the cough and sob, but the cough is not as bad.  Scheduled OV with Dr. Zaida to discuss heart issue further and how it could be affecting her breathing.  She is scheduled for 12/06/24 at 11:15 am, advised to arrive by 11:00 am for check in.

## 2024-12-02 NOTE — Progress Notes (Unsigned)
 Enrolled patient for a 3 day Zio XT monitor to be mailed to patients home

## 2024-12-03 ENCOUNTER — Ambulatory Visit: Payer: Self-pay | Admitting: Internal Medicine

## 2024-12-03 DIAGNOSIS — E782 Mixed hyperlipidemia: Secondary | ICD-10-CM

## 2024-12-03 LAB — LIPID PANEL
Chol/HDL Ratio: 5 ratio — ABNORMAL HIGH (ref 0.0–4.4)
Cholesterol, Total: 213 mg/dL — ABNORMAL HIGH (ref 100–199)
HDL: 43 mg/dL
LDL Chol Calc (NIH): 150 mg/dL — ABNORMAL HIGH (ref 0–99)
Triglycerides: 113 mg/dL (ref 0–149)
VLDL Cholesterol Cal: 20 mg/dL (ref 5–40)

## 2024-12-03 LAB — LIPOPROTEIN A (LPA): Lipoprotein (a): 65.2 nmol/L

## 2024-12-04 ENCOUNTER — Encounter: Payer: Self-pay | Admitting: Internal Medicine

## 2024-12-06 ENCOUNTER — Ambulatory Visit

## 2024-12-06 VITALS — BP 132/91 | HR 96 | Temp 98.4°F | Ht 65.0 in | Wt 194.0 lb

## 2024-12-06 DIAGNOSIS — R053 Chronic cough: Secondary | ICD-10-CM | POA: Diagnosis not present

## 2024-12-06 DIAGNOSIS — Z87891 Personal history of nicotine dependence: Secondary | ICD-10-CM | POA: Diagnosis not present

## 2024-12-06 DIAGNOSIS — R0602 Shortness of breath: Secondary | ICD-10-CM | POA: Diagnosis not present

## 2024-12-06 MED ORDER — CETIRIZINE HCL 10 MG PO TABS: 10.0000 mg | ORAL_TABLET | Freq: Every day | ORAL | 12 refills | Status: AC

## 2024-12-06 NOTE — Patient Instructions (Signed)
" °  VISIT SUMMARY: During your visit, we discussed your ongoing difficulty breathing, which you believe is due to mold exposure in your home. We reviewed your asthma management plan and made some adjustments to help improve your symptoms. We also addressed your concerns about allergic rhinitis and prescribed medication to help manage these symptoms.  YOUR PLAN: -ASTHMA WITH CHRONIC COUGH: Asthma is a condition where your airways narrow and swell, producing extra mucus, which can make breathing difficult. Your chronic cough is likely being worsened by mold exposure. Continue using your Symbicort  inhaler (2 puffs in the morning and at night, and remember to wash your mouth after use), albuterol  inhaler as needed (but not in the morning), and DuoNeb nebulizer in the morning followed by the budesonide  nebulizer 15 minutes later, twice daily. We have also started you on Zyrtec daily and initiated biologic therapy with an injectable medication every two weeks. We have coordinated with a specialty pharmacy for this medication. Please follow up in 6 months.  -ALLERGIC RHINITIS: Allergic rhinitis is an allergic reaction that causes sneezing, congestion, and a runny nose. Your symptoms may be related to mold exposure. We have prescribed Zyrtec to be taken daily to help manage these symptoms.  INSTRUCTIONS: Please follow up in 6 months to review your progress and adjust your treatment plan as needed.    Contains text generated by [employer].   "
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
## 2024-12-09 ENCOUNTER — Telehealth: Payer: Self-pay

## 2024-12-09 ENCOUNTER — Other Ambulatory Visit (HOSPITAL_COMMUNITY): Payer: Self-pay

## 2024-12-09 DIAGNOSIS — J4520 Mild intermittent asthma, uncomplicated: Secondary | ICD-10-CM

## 2024-12-09 NOTE — Telephone Encounter (Signed)
 Copied from CRM #[id number]. Topic: General - Other >> Dec 09, 2024  4:43 PM Rilla B wrote: Reason for CRM: Patient like to request a copy of the letter requesting prior authorization (medical necessity) be loaded up to her MyChart.  Patient would also like to speak to someone from the specialty pharmacy.  Please call patient @ [phone].

## 2024-12-09 NOTE — Telephone Encounter (Signed)
 Submitted a Prior Authorization request to [employer] for TEZSPIRE via CoverMyMeds. Will update once we receive a response.  Key: [id number]   Received notification from [facility] regarding a prior authorization for TEZSPIRE. Authorization has been APPROVED from 11/09/24 to 06/07/25. Awaiting fax of approval letter.  Unable to run test claim in Epic.  Authorization # [id number]  Routing for assistance with test claim and fill options.

## 2024-12-10 ENCOUNTER — Other Ambulatory Visit (HOSPITAL_COMMUNITY): Payer: Self-pay

## 2024-12-10 NOTE — Telephone Encounter (Signed)
 Unable to run test claim because pt must fill through [facility]. Pt enrolled in copay card:  BIN: [id number] PCN: [id number] Group: [id number] ID: [id number]  Phone #: [phone]

## 2024-12-10 NOTE — Telephone Encounter (Signed)
 Copied from CRM #[id number]. Topic: Referral - Prior Authorization Question >> Dec 10, 2024  1:23 PM Autumn Gordon wrote: Reason for CRM: Pt is requesting to speak with the staff's prior authorization team regarding her PA for Tezspire. Pt's phone number is [phone] ok to leave a vm.  Pt is requesting a copy to be uploaded to her chart regarding the medical necessity so she can have this for her records. Pt requested this yesterday evening around 507pm on 12/09/2024.

## 2024-12-11 ENCOUNTER — Other Ambulatory Visit: Payer: Self-pay | Admitting: Internal Medicine

## 2024-12-11 DIAGNOSIS — R0609 Other forms of dyspnea: Secondary | ICD-10-CM

## 2024-12-13 ENCOUNTER — Ambulatory Visit (HOSPITAL_COMMUNITY)
Admission: RE | Admit: 2024-12-13 | Discharge: 2024-12-13 | Disposition: A | Source: Ambulatory Visit | Attending: Internal Medicine

## 2024-12-13 DIAGNOSIS — R0609 Other forms of dyspnea: Secondary | ICD-10-CM | POA: Diagnosis present

## 2024-12-13 LAB — MYOCARDIAL PERFUSION IMAGING
Base ST Depression (mm): 0 mm
LV dias vol: 85 mL (ref 46–106)
LV sys vol: 23 mL
Nuc Stress EF: 73 %
Peak HR: 118 {beats}/min
Rest HR: 90 {beats}/min
Rest Nuclear Isotope Dose: 9.8 mCi
SDS: 0
SRS: 1
SSS: 0
ST Depression (mm): 0 mm
Stress Nuclear Isotope Dose: 32.7 mCi
TID: 1.08

## 2024-12-13 MED ORDER — TEZSPIRE 210 MG/1.91ML ~~LOC~~ SOAJ: 210.0000 mg | SUBCUTANEOUS | 3 refills | Status: AC

## 2024-12-13 MED ORDER — REGADENOSON 0.4 MG/5ML IV SOLN
0.4000 mg | Freq: Once | INTRAVENOUS | Status: AC
  Administered 2024-12-13: 0.4 mg via INTRAVENOUS

## 2024-12-13 MED ORDER — REGADENOSON 0.4 MG/5ML IV SOLN
INTRAVENOUS | Status: AC
  Filled 2024-12-13: qty 5

## 2024-12-13 MED ORDER — TECHNETIUM TC 99M TETROFOSMIN IV KIT
31.6000 | PACK | Freq: Once | INTRAVENOUS | Status: AC | PRN
  Administered 2024-12-13: 31.6 via INTRAVENOUS

## 2024-12-13 MED ORDER — TECHNETIUM TC 99M TETROFOSMIN IV KIT
9.8000 | PACK | Freq: Once | INTRAVENOUS | Status: AC | PRN
  Administered 2024-12-13: 9.8 via INTRAVENOUS

## 2024-12-13 NOTE — Telephone Encounter (Signed)
 Copied from CRM [id number]. Topic: Clinical - Prescription Issue >> Dec 10, 2024 11:03 AM Benton KIDD wrote: Reason for CRM: [facility] get [facility] new script trezspire 210 mg for patient .   [facility]  Fax number [phone] Phone number [phone]

## 2024-12-13 NOTE — Telephone Encounter (Signed)
 Copied from CRM #[id number]. Topic: Referral - Prior Authorization Question >> Dec 10, 2024  1:23 PM Celestine FALCON wrote: Reason for CRM: Pt is requesting to speak with the staff's prior authorization team regarding her PA for Tezspire . Pt's phone number is [phone] ok to leave a vm.  Pt is requesting a copy to be uploaded to her chart regarding the medical necessity so she can have this for her records. Pt requested this yesterday evening around 507pm on 12/09/2024. >> Dec 11, 2024  4:28 PM Dedra B wrote: Patient is calling to follow up on request for callback. Please call patient.  Routing to pharmacy

## 2024-12-13 NOTE — Telephone Encounter (Signed)
 Spoke to patient - she was unable to schedule new start Tezspire  at this time as she did not have her calendar nearby. She will call pulmonary office to schedule appt.   Will triage Rx to [facility]. Advised to store Tezspire  in the refrigerator, but do not injection first dose at home. First dose will be observed in clinic.   Patient verbalizes understanding and agreement with plan.

## 2024-12-16 ENCOUNTER — Ambulatory Visit (HOSPITAL_COMMUNITY)

## 2024-12-17 DIAGNOSIS — R002 Palpitations: Secondary | ICD-10-CM

## 2024-12-17 DIAGNOSIS — R0609 Other forms of dyspnea: Secondary | ICD-10-CM | POA: Diagnosis not present

## 2024-12-20 NOTE — Telephone Encounter (Signed)
 Unable to call number on file. MyChart message sent to call office to schedule appointment.

## 2025-01-06 ENCOUNTER — Ambulatory Visit (HOSPITAL_COMMUNITY)

## 2025-03-03 ENCOUNTER — Ambulatory Visit: Admitting: Internal Medicine
